# Patient Record
Sex: Male | Born: 1935 | Race: White | Hispanic: No | Marital: Married | Smoking: Never smoker
Health system: Southern US, Community
[De-identification: ages and names within clinical notes are randomized; demographics above are authoritative.]

## PROBLEM LIST (undated history)

## (undated) DIAGNOSIS — I071 Rheumatic tricuspid insufficiency: Secondary | ICD-10-CM

## (undated) DIAGNOSIS — K761 Chronic passive congestion of liver: Secondary | ICD-10-CM

## (undated) DIAGNOSIS — I4891 Unspecified atrial fibrillation: Secondary | ICD-10-CM

## (undated) DIAGNOSIS — R110 Nausea: Secondary | ICD-10-CM

## (undated) DIAGNOSIS — D649 Anemia, unspecified: Secondary | ICD-10-CM

## (undated) DIAGNOSIS — I780 Hereditary hemorrhagic telangiectasia: Secondary | ICD-10-CM

## (undated) DIAGNOSIS — I509 Heart failure, unspecified: Secondary | ICD-10-CM

## (undated) DIAGNOSIS — I272 Pulmonary hypertension, unspecified: Secondary | ICD-10-CM

## (undated) DIAGNOSIS — I50810 Right heart failure, unspecified: Secondary | ICD-10-CM

## (undated) DIAGNOSIS — R197 Diarrhea, unspecified: Secondary | ICD-10-CM

## (undated) DIAGNOSIS — K7460 Unspecified cirrhosis of liver: Secondary | ICD-10-CM

## (undated) DIAGNOSIS — N289 Disorder of kidney and ureter, unspecified: Secondary | ICD-10-CM

## (undated) DIAGNOSIS — D500 Iron deficiency anemia secondary to blood loss (chronic): Principal | ICD-10-CM

## (undated) DIAGNOSIS — I770 Arteriovenous fistula, acquired: Secondary | ICD-10-CM

## (undated) DIAGNOSIS — Z950 Presence of cardiac pacemaker: Secondary | ICD-10-CM

## (undated) DIAGNOSIS — C439 Malignant melanoma of skin, unspecified: Secondary | ICD-10-CM

## (undated) HISTORY — DX: Iron deficiency anemia secondary to blood loss (chronic): D50.0

## (undated) HISTORY — DX: Malignant melanoma of skin, unspecified: C43.9

## (undated) HISTORY — DX: Nausea: R11.0

## (undated) HISTORY — DX: Pulmonary hypertension, unspecified: I27.20

## (undated) HISTORY — DX: Unspecified atrial fibrillation: I48.91

## (undated) HISTORY — DX: Right heart failure, unspecified: I50.810

## (undated) HISTORY — DX: Disorder of kidney and ureter, unspecified: N28.9

## (undated) HISTORY — DX: Arteriovenous fistula, acquired: I77.0

## (undated) HISTORY — DX: Rheumatic tricuspid insufficiency: I07.1

## (undated) HISTORY — DX: Chronic passive congestion of liver: K76.1

## (undated) HISTORY — DX: Diarrhea, unspecified: R19.7

## (undated) HISTORY — PX: TONSILLECTOMY: SHX5217

## (undated) HISTORY — DX: Hereditary hemorrhagic telangiectasia: I78.0

## (undated) HISTORY — PX: PACEMAKER INSERTION: SHX728

## (undated) HISTORY — PX: COLON SURGERY: SHX602

---

## 2015-04-22 ENCOUNTER — Emergency Department: Payer: Medicare Other

## 2015-04-22 ENCOUNTER — Emergency Department
Admission: EM | Admit: 2015-04-22 | Discharge: 2015-04-22 | Disposition: A | Discharge status: Home / Self Care | Payer: Medicare Other | Attending: Emergency Medicine | Admitting: Emergency Medicine

## 2015-04-22 ENCOUNTER — Other Ambulatory Visit: Payer: Self-pay

## 2015-04-22 DIAGNOSIS — I959 Hypotension, unspecified: Secondary | ICD-10-CM

## 2015-04-22 DIAGNOSIS — Z79899 Other long term (current) drug therapy: Secondary | ICD-10-CM | POA: Diagnosis not present

## 2015-04-22 DIAGNOSIS — R031 Nonspecific low blood-pressure reading: Secondary | ICD-10-CM | POA: Insufficient documentation

## 2015-04-22 DIAGNOSIS — R531 Weakness: Secondary | ICD-10-CM | POA: Diagnosis not present

## 2015-04-22 DIAGNOSIS — D649 Anemia, unspecified: Secondary | ICD-10-CM | POA: Insufficient documentation

## 2015-04-22 HISTORY — DX: Presence of cardiac pacemaker: Z95.0

## 2015-04-22 HISTORY — DX: Hereditary hemorrhagic telangiectasia: I78.0

## 2015-04-22 HISTORY — DX: Heart failure, unspecified: I50.9

## 2015-04-22 HISTORY — DX: Anemia, unspecified: D64.9

## 2015-04-22 LAB — BASIC METABOLIC PANEL
Anion gap: 8 (ref 5–15)
BUN: 61 mg/dL — ABNORMAL HIGH (ref 6–20)
CO2: 27 mmol/L (ref 22–32)
Calcium: 8.5 mg/dL — ABNORMAL LOW (ref 8.9–10.3)
Chloride: 93 mmol/L — ABNORMAL LOW (ref 101–111)
Creatinine, Ser: 1.91 mg/dL — ABNORMAL HIGH (ref 0.61–1.24)
GFR calc Af Amer: 37 mL/min — ABNORMAL LOW (ref >60)
GFR, EST NON AFRICAN AMERICAN: 32 mL/min — AB (ref >60)
GLUCOSE: 137 mg/dL — AB (ref 65–99)
POTASSIUM: 2.9 mmol/L — AB (ref 3.5–5.1)
SODIUM: 128 mmol/L — AB (ref 135–145)

## 2015-04-22 LAB — COMPREHENSIVE METABOLIC PANEL
ALK PHOS: 85 U/L (ref 38–126)
ALT: 20 U/L (ref 17–63)
ANION GAP: 10 (ref 5–15)
AST: 35 U/L (ref 15–41)
Albumin: 3.2 g/dL — ABNORMAL LOW (ref 3.5–5.0)
BILIRUBIN TOTAL: 2.1 mg/dL — AB (ref 0.3–1.2)
BUN: 61 mg/dL — AB (ref 6–20)
CHLORIDE: 92 mmol/L — AB (ref 101–111)
CO2: 28 mmol/L (ref 22–32)
Calcium: 8.7 mg/dL — ABNORMAL LOW (ref 8.9–10.3)
Creatinine, Ser: 1.86 mg/dL — ABNORMAL HIGH (ref 0.61–1.24)
GFR calc Af Amer: 39 mL/min — ABNORMAL LOW (ref >60)
GFR calc non Af Amer: 33 mL/min — ABNORMAL LOW (ref >60)
GLUCOSE: 132 mg/dL — AB (ref 65–99)
POTASSIUM: 2.9 mmol/L — AB (ref 3.5–5.1)
SODIUM: 130 mmol/L — AB (ref 135–145)
Total Protein: 5.3 g/dL — ABNORMAL LOW (ref 6.5–8.1)

## 2015-04-22 LAB — CBC
HEMATOCRIT: 23.7 % — AB (ref 40.0–52.0)
HEMOGLOBIN: 7.5 g/dL — AB (ref 13.0–18.0)
MCH: 27.7 pg (ref 26.0–34.0)
MCHC: 31.8 g/dL — AB (ref 32.0–36.0)
MCV: 87.3 fL (ref 80.0–100.0)
Platelets: 64 10*3/uL — ABNORMAL LOW (ref 150–440)
RBC: 2.72 MIL/uL — AB (ref 4.40–5.90)
RDW: 15.3 % — ABNORMAL HIGH (ref 11.5–14.5)
WBC: 3.5 10*3/uL — ABNORMAL LOW (ref 3.8–10.6)

## 2015-04-22 LAB — PROTIME-INR
INR: 1.21
Prothrombin Time: 15.5 seconds — ABNORMAL HIGH (ref 11.4–15.0)

## 2015-04-22 LAB — TROPONIN I: Troponin I: 0.06 ng/mL — ABNORMAL HIGH (ref <0.031)

## 2015-04-22 MED ORDER — POTASSIUM CHLORIDE CRYS ER 20 MEQ PO TBCR
40.0000 meq | EXTENDED_RELEASE_TABLET | Freq: Once | ORAL | Status: AC
  Administered 2015-04-22: 40 meq via ORAL

## 2015-04-22 MED ORDER — POTASSIUM CHLORIDE CRYS ER 20 MEQ PO TBCR
EXTENDED_RELEASE_TABLET | ORAL | Status: AC
  Administered 2015-04-22: 40 meq via ORAL
  Filled 2015-04-22: qty 2

## 2015-04-22 MED ORDER — SODIUM CHLORIDE 0.9 % IV SOLN
Freq: Once | INTRAVENOUS | Status: AC
  Administered 2015-04-22: 14:00:00 via INTRAVENOUS

## 2015-04-22 NOTE — ED Notes (Signed)
Pt c/o generalized weakness today with low b/p 87/40's this morning, normal b/p 90's/60'..Denies pain, recent illness such as N/V/D.[name]

## 2015-04-22 NOTE — ED Notes (Signed)
Pt informed to return if any life threatening symptoms occur.  Pt alert and oriented X4, active, cooperative, pt in NAD. RR even and unlabored, color WNL.  Pt left with significant other.

## 2015-04-22 NOTE — Discharge Instructions (Signed)
Hypotension  As your heart beats, it forces blood through your arteries. This force is your blood pressure. If your blood pressure is too low for you to go about your normal activities or to support the organs of your body, you have hypotension. Hypotension is also referred to as low blood pressure. When your blood pressure becomes too low, you may not get enough blood to your brain. As a result, you may feel weak, feel lightheaded, or develop a rapid heart rate. In a more severe case, you may faint.  CAUSES  Various conditions can cause hypotension. These include:  · Blood loss.  · Dehydration.  · Heart or endocrine problems.  · Pregnancy.  · Severe infection.  · Not having a well-balanced diet filled with needed nutrients.  · Severe allergic reactions (anaphylaxis).  Some medicines, such as blood pressure medicine or water pills (diuretics), may lower your blood pressure below normal. Sometimes taking too much medicine or taking medicine not as directed can cause hypotension.  TREATMENT   Hospitalization is sometimes required for hypotension if fluid or blood replacement is needed, if time is needed for medicines to wear off, or if further monitoring is needed. Treatment might include changing your diet, changing your medicines (including medicines aimed at raising your blood pressure), and use of support stockings.  HOME CARE INSTRUCTIONS   · Drink enough fluids to keep your urine clear or pale yellow.  · Take your medicines as directed by your health care provider.  · Get up slowly from reclining or sitting positions. This gives your blood pressure a chance to adjust.  · Wear support stockings as directed by your health care provider.  · Maintain a healthy diet by including nutritious food, such as fruits, vegetables, nuts, whole grains, and lean meats.  SEEK MEDICAL CARE IF:  · You have vomiting or diarrhea.  · You have a fever for more than 2-3 days.  · You feel more thirsty than usual.  · You feel weak and  tired.  SEEK IMMEDIATE MEDICAL CARE IF:   · You have chest pain or a fast or irregular heartbeat.  · You have a loss of feeling in some part of your body, or you lose movement in your arms or legs.  · You have trouble speaking.  · You become sweaty or feel lightheaded.  · You faint.  MAKE SURE YOU:   · Understand these instructions.  · Will watch your condition.  · Will get help right away if you are not doing well or get worse.  Document Released: 11/27/2005 Document Revised: 09/17/2013 Document Reviewed: 05/30/2013  ExitCare® Patient Information ©2015 [employer]. This information is not intended to replace advice given to you by your health care provider. Make sure you discuss any questions you have with your health care provider.

## 2015-04-22 NOTE — ED Provider Notes (Signed)
[facility] Emergency Department Provider Note    Time seen: 1400  I have reviewed the triage vital signs and the nursing notes.   HISTORY  Chief Complaint Weakness and Hypotension    HPI Hunter [name] is a 80 y.o. male who presents to ER for weakness and low blood pressure. Patient was seen at the outpatient clinic, his blood pressure noted to be 87/40. Patient just describes general ill feeling of weakness, denies any fevers chills chest pain shortness of breath nausea vomiting diarrhea. No recent changes in his medications. He has noted poor appetite of late.    Past Medical History  Diagnosis Date  . CHF (congestive heart failure)   . Pacemaker   . HHT (hereditary hemorrhagic telangiectasia)   . Anemia     There are no active problems to display for this patient.   Past Surgical History  Procedure Laterality Date  . Pacemaker insertion      Current Outpatient Rx  Name  Route  Sig  Dispense  Refill  . cyanocobalamin 1000 MCG tablet   Intramuscular   Inject 100 mcg into the muscle every 30 (thirty) days.         . digoxin (LANOXIN) 0.125 MG tablet   Oral   Take by mouth daily.         . ferrous sulfate 325 (65 FE) MG EC tablet   Oral   Take 325 mg by mouth 3 (three) times daily with meals.         . magnesium 30 MG tablet   Oral   Take 250 mg by mouth 1 day or 1 dose.         . metolazone (ZAROXOLYN) 2.5 MG tablet   Oral   Take 2.5 mg by mouth daily.         . metolazone (ZAROXOLYN) 5 MG tablet   Oral   Take 5 mg by mouth daily.         . potassium chloride (MICRO-K) 10 MEQ CR capsule   Oral   Take 10 mEq by mouth 2 (two) times daily.         [name] spironolactone (ALDACTONE) 25 MG tablet   Oral   Take 25 mg by mouth 2 (two) times daily.         . tamsulosin (FLOMAX) 0.4 MG CAPS capsule   Oral   Take 0.4 mg by mouth.         . torsemide (DEMADEX) 100 MG tablet   Oral   Take 100 mg by mouth daily.        [name] vorinostat (ZOLINZA) 100 MG capsule   Oral   Take 400 mg by mouth daily. Take with food.         . zolpidem (AMBIEN) 5 MG tablet   Oral   Take 5 mg by mouth at bedtime as needed for sleep.           Allergies Review of patient's allergies indicates no known allergies.  No family history on file.  Social History History  Substance Use Topics  . Smoking status: Never Smoker   . Smokeless tobacco: Never Used  . Alcohol Use: No    Review of Systems Constitutional: Negative for fever. Eyes: Negative for visual changes. ENT: Negative for sore throat. Cardiovascular: Negative for chest pain. Respiratory: Negative for shortness of breath. Gastrointestinal: Negative for abdominal pain, vomiting and diarrhea. Genitourinary: Negative for dysuria. Musculoskeletal: Negative for back pain. Skin: Negative for  rash. Neurological: Negative for headaches, positive for generalized weakness  10-point ROS otherwise negative.  ____________________________________________   PHYSICAL EXAM:  VITAL SIGNS: ED Triage Vitals  Enc Vitals Group     BP 04/22/15 1225 97/47 mmHg     Pulse Rate 04/22/15 1225 107     Resp 04/22/15 1225 20     Temp 04/22/15 1225 97.7 F (36.5 C)     Temp Source 04/22/15 1225 Oral     SpO2 04/22/15 1225 100 %     Weight 04/22/15 1225 155 lb (70.308 kg)     Height 04/22/15 1225 6' (1.829 m)     Head Cir --      Peak Flow --      Pain Score --      Pain Loc --      Pain Edu? --      Excl. in [place]? --     Constitutional: Alert and oriented. Well appearing and in no distress. Eyes: Conjunctivae are normal. PERRL. Normal extraocular movements. Scleral icterus ENT   Head: Normocephalic and atraumatic.   Nose: No congestion/rhinnorhea.   Mouth/Throat: Mucous membranes are moist.   Neck: No stridor. Hematological/Lymphatic/Immunilogical: No cervical lymphadenopathy. Cardiovascular: Normal rate, regular rhythm. Normal and symmetric distal  pulses are present in all extremities. No murmurs, rubs, or gallops. Respiratory: Normal respiratory effort without tachypnea nor retractions. Breath sounds are clear and equal bilaterally. No wheezes/rales/rhonchi. Gastrointestinal: Soft and nontender. Likely ascites Musculoskeletal: Nontender with normal range of motion in all extremities. No joint effusions.  No lower extremity tenderness nor edema. Neurologic:  Normal speech and language. No gross focal neurologic deficits are appreciated. Speech is normal. No gait instability. Skin:  Skin is warm, dry and intact. Jaundice is present Psychiatric: Mood and affect are normal. Speech and behavior are normal. Patient exhibits appropriate insight and judgment.  ____________________________________________    LABS (pertinent positives/negatives)  Labs Reviewed  CBC - Abnormal; Notable for the following:    WBC 3.5 (*)    RBC 2.72 (*)    Hemoglobin 7.5 (*)    HCT 23.7 (*)    MCHC 31.8 (*)    RDW 15.3 (*)    Platelets 64 (*)    All other components within normal limits  BASIC METABOLIC PANEL - Abnormal; Notable for the following:    Sodium 128 (*)    Potassium 2.9 (*)    Chloride 93 (*)    Glucose, Bld 137 (*)    BUN 61 (*)    Creatinine, Ser 1.91 (*)    Calcium 8.5 (*)    GFR calc non Af Amer 32 (*)    GFR calc Af Amer 37 (*)    All other components within normal limits     ____________________________________________ EKG: Ventricular paced rhythm with a rate of 80   RADIOLOGY  Chest x-ray reveals cardiomegaly with slight vascular congestion  ____________________________________________    ED COURSE  Pertinent labs & imaging results that were available during my care of the patient were reviewed by me and considered in my medical decision making (see chart for details).  Patient received IV fluid hydration, will check basic labs were evaluated.  I discussed the case with patient's primary cardiologist in [place].  We discussed the lab results, and elevated troponin. Cardiologist was Dr. Lupita Dawn. He is not concerned with a troponin elevation. States hemoglobin was slightly lower than normal and the creatinine was slightly higher than normal. He attributed the increased creatinine to decreased by mouth  intake  FINAL ASSESSMENT AND PLAN  Weakness and hypotension  Plan: Patient is follow-up with his cardiologist on Tuesday, will encourage him to continue that appointment. I offered admission, the family has declined at this point. He is stable for discharge this time with a normal blood pressure.    Earleen Newport, MD   Earleen Newport, MD 04/22/15 [phone]

## 2015-04-22 NOTE — ED Notes (Signed)
EDP aware of troponin result.  

## 2015-04-22 NOTE — ED Notes (Addendum)
C/o weakness progressively for "weeks". Sent to ER for low blood pressure. Reports of systolic in 82'C. Pt pale. Denies GI bleeding. Denies nausea, vomiting or diarrhea. Recent nose bleeds, last one on Tuesday. Denies CP or SOB. Pt alert and oriented X4, cooperative, pt in NAD. Denies dizziness. Pt appears pale.

## 2015-04-22 NOTE — ED Notes (Signed)
MD at bedside. 

## 2015-07-16 ENCOUNTER — Encounter: Payer: Self-pay | Admitting: Internal Medicine

## 2015-08-09 ENCOUNTER — Encounter: Payer: Self-pay | Admitting: Internal Medicine

## 2015-08-09 ENCOUNTER — Other Ambulatory Visit (INDEPENDENT_AMBULATORY_CARE_PROVIDER_SITE_OTHER): Payer: Medicare Other

## 2015-08-09 ENCOUNTER — Ambulatory Visit (INDEPENDENT_AMBULATORY_CARE_PROVIDER_SITE_OTHER): Payer: Medicare Other | Admitting: Internal Medicine

## 2015-08-09 VITALS — BP 85/40 | HR 80 | Ht 72.0 in | Wt 175.1 lb

## 2015-08-09 DIAGNOSIS — I340 Nonrheumatic mitral (valve) insufficiency: Secondary | ICD-10-CM

## 2015-08-09 DIAGNOSIS — I770 Arteriovenous fistula, acquired: Secondary | ICD-10-CM

## 2015-08-09 DIAGNOSIS — I071 Rheumatic tricuspid insufficiency: Secondary | ICD-10-CM

## 2015-08-09 DIAGNOSIS — K761 Chronic passive congestion of liver: Secondary | ICD-10-CM | POA: Diagnosis not present

## 2015-08-09 DIAGNOSIS — D500 Iron deficiency anemia secondary to blood loss (chronic): Secondary | ICD-10-CM

## 2015-08-09 DIAGNOSIS — I509 Heart failure, unspecified: Secondary | ICD-10-CM

## 2015-08-09 DIAGNOSIS — I780 Hereditary hemorrhagic telangiectasia: Secondary | ICD-10-CM | POA: Insufficient documentation

## 2015-08-09 DIAGNOSIS — I50810 Right heart failure, unspecified: Secondary | ICD-10-CM

## 2015-08-09 DIAGNOSIS — N189 Chronic kidney disease, unspecified: Secondary | ICD-10-CM

## 2015-08-09 HISTORY — DX: Iron deficiency anemia secondary to blood loss (chronic): D50.0

## 2015-08-09 HISTORY — DX: Rheumatic tricuspid insufficiency: I07.1

## 2015-08-09 HISTORY — DX: Right heart failure, unspecified: I50.810

## 2015-08-09 HISTORY — DX: Arteriovenous fistula, acquired: I77.0

## 2015-08-09 HISTORY — DX: Chronic passive congestion of liver: K76.1

## 2015-08-09 LAB — CBC WITH DIFFERENTIAL/PLATELET
BASOS ABS: 0 10*3/uL (ref 0.0–0.1)
Basophils Relative: 0.3 % (ref 0.0–3.0)
EOS ABS: 0 10*3/uL (ref 0.0–0.7)
Eosinophils Relative: 0 % (ref 0.0–5.0)
HCT: 17.3 % — CL (ref 39.0–52.0)
Hemoglobin: 5.5 g/dL — CL (ref 13.0–17.0)
LYMPHS ABS: 0.3 10*3/uL — AB (ref 0.7–4.0)
Lymphocytes Relative: 7.4 % — ABNORMAL LOW (ref 12.0–46.0)
MCHC: 31.8 g/dL (ref 30.0–36.0)
MCV: 84.9 fl (ref 78.0–100.0)
MONO ABS: 0.7 10*3/uL (ref 0.1–1.0)
NEUTROS PCT: 73.6 % (ref 43.0–77.0)
Neutro Abs: 2.7 10*3/uL (ref 1.4–7.7)
Platelets: 56 10*3/uL — ABNORMAL LOW (ref 150.0–400.0)
RBC: 2.04 Mil/uL — AB (ref 4.22–5.81)
RDW: 22.3 % — ABNORMAL HIGH (ref 11.5–15.5)
WBC: 3.6 10*3/uL — ABNORMAL LOW (ref 4.0–10.5)

## 2015-08-09 LAB — COMPREHENSIVE METABOLIC PANEL
ALK PHOS: 176 U/L — AB (ref 39–117)
ALT: 26 U/L (ref 0–53)
AST: 30 U/L (ref 0–37)
Albumin: 2.8 g/dL — ABNORMAL LOW (ref 3.5–5.2)
BILIRUBIN TOTAL: 2 mg/dL — AB (ref 0.2–1.2)
BUN: 70 mg/dL — AB (ref 6–23)
CO2: 29 meq/L (ref 19–32)
Calcium: 8.4 mg/dL (ref 8.4–10.5)
Chloride: 87 mEq/L — ABNORMAL LOW (ref 96–112)
Creatinine, Ser: 2.19 mg/dL — ABNORMAL HIGH (ref 0.40–1.50)
GFR: 31.1 mL/min — ABNORMAL LOW (ref >60.00)
GLUCOSE: 107 mg/dL — AB (ref 70–99)
Potassium: 4.6 mEq/L (ref 3.5–5.1)
SODIUM: 122 meq/L — AB (ref 135–145)
TOTAL PROTEIN: 4.9 g/dL — AB (ref 6.0–8.3)

## 2015-08-09 LAB — AMMONIA: Ammonia: 113 umol/L — ABNORMAL HIGH (ref 11–35)

## 2015-08-09 LAB — TSH: TSH: 5.49 u[IU]/mL — ABNORMAL HIGH (ref 0.35–4.50)

## 2015-08-09 LAB — BRAIN NATRIURETIC PEPTIDE: Pro B Natriuretic peptide (BNP): 188 pg/mL — ABNORMAL HIGH (ref 0.0–100.0)

## 2015-08-09 LAB — PROTIME-INR
INR: 1.2 ratio — ABNORMAL HIGH (ref 0.8–1.0)
Prothrombin Time: 13.5 s — ABNORMAL HIGH (ref 9.6–13.1)

## 2015-08-09 NOTE — Patient Instructions (Signed)
Your physician has requested that you go to the basement for lab work before leaving today.   Dr. Carlean Purl will speak with Dr Haroldine Laws at [facility] and contact you with an appointment.    I appreciate the opportunity to care for you. Silvano Rusk, MD, [facility]

## 2015-08-09 NOTE — Progress Notes (Signed)
Subjective:    Patient ID: Hunter [name], male    DOB: Jan 04, 1935, 80 y.o.   MRN: [id number] Cc; cirrhosis, establish care HPI  The patient is an elderly white man here with his wife and another family member, he has recently moved to [place]. He has right-sided heart failure, hereditary hemorrhagic telangiectasia and cirrhosis thought to be due to his right heart failure. He has come to me to establish care. Over the past either so he is deteriorated with worsening dyspnea and fatigue, he has had the development of hepatic encephalopathy. His wife said that that is better at this point on Xifaxan and lactulose. He is having some increasing pedal and lower extremity edema. He ambulates in the home with a walker and moves slowly and apparently doesn't get short of breath moving from one room to the other. He is not describing any chest pain problems. He has not had any GI bleeding. He has chronic recurrent anemia and has had a number of transfusions over the years. He has suffered with epistaxis in the past but not recently. Other cardiac review of systems is negative for orthopnea. He has intermittent anorexia some nausea. Perhaps some early satiety at times. He does not think he's having increasing abdominal girth at this time though he has had ascites per the family. There is anhedonia. He has received care at [facility] and the [facility] [facility] of late.  He was hospitalized in [place], discharged 07/24/2015. He was treated with Xifaxan and lactulose for his acute hepatic encephalopathy. Chronic renal insufficiency noted. Spironolactone was restarted at half dose was limited by blood pressure issues. He has a history of A. Fib. Previously admitted 06/30/1959 07/08/2015 with similar problems.  He was seen at the [facility] cardiology clinic. There after he left it was entertained for him to undergo possible  bevacizumab Tx for hepatic AVM's that were worsening HF.  Other notes and her uses  studies from before that visit summarized below  EKG today ventricularly paced at 88 bpm.   TTE 02/09/15, which I reviewed today, found low normal LV function, enlarged RV with mildly impaired function, trivial pericardial effusion, moderate MR, probably severe TR which seems to be related to both dilation and pacemaker lead, biatrial enlargement, interatrial septum bows to the left, dilated IVC, and estimated PASP 65 mmHg.  Hemodynamic catheterization 01/29/15 LVEDP 105/25, Ao 105/50, RA mean 20, RV 45/20, PA 45/30/35, PCWP 30, thermal CO/CI 11.4/5.4, Fick 5.9/2.8.  CTPA 03/01/15, which I reviewed today, found aortic and coronary calcifications,  1. No evidence of pulmonary AVMs in this patient with a reported history of HHT. 2. Dilation of the pulmonary arteries suggesting chronic pulmonary hypertension. 3. Evidence of multiple hepatic AVMs with dilation of the hepatic arteries. Liver demonstrates surface irregularity suggesting cirrhosis. These findings are unchanged from 2013. 4. Marked dilation of the intrahepatic IVC consistent with chronically elevated vena cava pressures. 5. Moderate to severe cardiomegaly.  PFTs today FVC 66%, FEV1 62%, FEV1/FVC 0.67, VC 64%, TLC 85%, RV 118%, DLCO 65% (uncorrected).  6 minute walk today 290 m.  Pacemaker interrogation 04/27/15 with Dr. Lennie Odor found VP 99%.  CTA chest 03/01/2015 IMPRESSION: 1. No evidence of pulmonary AVMs in this patient with a reported history of HHT.  2. Dilation of the pulmonary arteries suggesting chronic pulmonary arterial hypertension.  3. Evidence of multiple hepatic AVMs with dilation of the hepatic arteries. Liver demonstrates surface irregularity suggesting cirrhosis. These findings are unchanged from 2013.  4. Marked dilation of  the intrahepatic IVC consistent with chronically elevated vena cava pressures.  5. Moderate to severe cardiomegaly.  6. Other chronic findings as above. No Known Allergies Outpatient  Prescriptions Prior to Visit  Medication Sig Dispense Refill  . Cyanocobalamin 1000 MCG/ML KIT Inject 1 Syringe as directed every 30 (thirty) days.    . digoxin (LANOXIN) 0.125 MG tablet Take by mouth. Mon, Wed, Fri    . ferrous sulfate 325 (65 FE) MG EC tablet Take 325 mg by mouth 2 (two) times daily with a meal.     . Magnesium 250 MG TABS Take 1 tablet by mouth daily.    . metolazone (ZAROXOLYN) 2.5 MG tablet Take 2.5 mg by mouth. Mon and Fri    . potassium chloride (MICRO-K) 10 MEQ CR capsule Take 10 mEq by mouth 2 (two) times daily.    [name] spironolactone (ALDACTONE) 25 MG tablet Take 25 mg by mouth daily.     . tamsulosin (FLOMAX) 0.4 MG CAPS capsule Take 0.4 mg by mouth.    . torsemide (DEMADEX) 100 MG tablet Take 100 mg by mouth as directed.     . vorinostat (ZOLINZA) 100 MG capsule Take 400 mg by mouth daily. Take with food.    . metolazone (ZAROXOLYN) 2.5 MG tablet Take 2.5 mg by mouth daily.    [name] zolpidem (AMBIEN) 5 MG tablet Take 5 mg by mouth at bedtime as needed for sleep.     No facility-administered medications prior to visit.   Past Medical History  Diagnosis Date  . CHF (congestive heart failure)   . Pacemaker   . HHT (hereditary hemorrhagic telangiectasia)   . Anemia   . Pulmonary hypertension   . Osler-Weber-Rendu syndrome   . Renal insufficiency   . Liver failure   . Atrial fibrillation   . Hepatic AV fistula 08/09/2015  . 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 Anemia due to chronic blood loss 08/09/2015  . Right heart failure 08/09/2015  . Tricuspid regurgitation 08/09/2015   Past Surgical History  Procedure Laterality Date  . Pacemaker insertion    . Colon surgery  1196   Social History   Social History  . Marital Status: Married    Spouse Name: N/A  . Number of Children: 1  . Years of Education: N/A   Occupational History  . retired    Social History Main Topics  . Smoking status: Never Smoker   . Smokeless tobacco: Never Used  . Alcohol Use: No  . Drug  Use: No  . Sexual Activity: Yes   Other Topics Concern  . None   Social History Narrative   Married 1 son   Retired Therapist, art   One caffeinated beverage a day no alcohol   He was living in [employer] at the [place] on [address] in [place] and has located to [place] recently.   08/09/2015      Family History  Problem Relation Age of Onset  . Other Mother     HHT  . Other Son     HHT  . Heart failure Father        Review of Systems As per history of present illness. Ears eyeglasses. Hearing aids. All other review of systems negative or as per history of present illness.    Objective:   Physical Exam _0  85/40 mmHg  Pulse 80  Ht 6' (1.829 m)  Wt 175 lb 2 oz (79.436 kg)  BMI 23.75 kg/m2@  General:  Well-developed, well-nourished  and in no acute distress Eyes:  anicteric. ENT:   Mouth and posterior pharynx free - +++telangiectasia  Neck:   supple w/o thyromegaly or mass.  Lungs: Clear to auscultation bilaterally. Heart:  Paced S1S2,  there is a regurgitant murmur heard best in the left lower sternal border it does not seem to radiate into the axilla much no gallops. There is JVD Abdomen:  soft, non-tender, no hepatosplenomegaly, hernia, or mass and BS+. It is mildly distended but I do not detect ascites and I do not hear any bruits over the liver Lymph:  no cervical or supraclavicular adenopathy. Extremities:   1+LE bilat  edema, cyanosis or clubbing Skin   + telangiectasia, purpura, pale nailbeds, chronic venous stasis changes in the pretibial areas  Neuro:  A&O x 3. No asterixis Psych:  appropriate mood and  Affect.   Data Reviewed:   Ultrasound US ABDOMEN LIMITED7/28/2016  [facility]  US ABDOMEN LIMITED7/28/2016  [facility]  Result Impression  IMPRESSION:  Sonographic signs of cirrhosis/fibrosis.  No focal lesions.  If there is further clinical concern, consider MRI.  Inferior vena cava and hepatic venous  dilatation likely secondary to congestive heart failure.  Cholelithiasis without inflammatory change.  If there is additional clinical concern, consider hepatobiliary scan.    Chronic renal parenchymal disease.    He has negative for hepatitis A, B, and C studies. He has had pneumonia vaccines and has been up-to-date with influenza over time his wife says. Alpha-1 antitrypsin level was normal  Ferritin was 28 and July.  He does not have cerebral AVM's, he has had brain imaging  No prior EGD or colonoscopy reported    Assessment & Plan:   1. Anemia due to chronic blood loss   2. HHT (hereditary hemorrhagic telangiectasia)   3. Cardiac cirrhosis   4. Right heart failure   5. Mitral regurgitation   6. Tricuspid regurgitation   7. Hepatic AV fistula   8. Chronic kidney disease Is a very complex patient with progressive problems. I think he might be more anemic. That certainly would exacerbate his right heart failure. He has renal insufficiency as well. I had planned to get him an evaluation in the heart failure clinic this week. His labs is subsequently did return with multiple metabolic derangements, hemoglobin 5 sodium 122 and an elevated ammonia. His creatinine is worse. I have attempted to contact the patient cannot reach him by phone at this time. I will try again. I think he will benefit from admission to the hospital and transfusion and further therapy. Long-term I'm not sure how much could be done for him.  As far as considering bevacizumab I look this up and a [place] using a phase II trial also in a patient such as this I'll think is worth any consideration most likely was here to enroll in a research trial. He is so ill otherwise I doubt the risks are worth the potential benefit that is unclear.  Lab Results  Component Value Date   WBC 3.6* 08/09/2015   HGB 5.5 Repeated and verified X2.* 08/09/2015   HCT 17.3 Repeated and verified X2.* 08/09/2015   MCV 84.9 08/09/2015   PLT  56.0 Repeated and verified X2.* 08/09/2015     Chemistry      Component Value Date/Time   NA 122* 08/09/2015 1621   K 4.6 08/09/2015 1621   CL 87* 08/09/2015 1621   CO2 29 08/09/2015 1621   BUN 70* 08/09/2015 1621   CREATININE 2.19*  08/09/2015 1621      Component Value Date/Time   CALCIUM 8.4 08/09/2015 1621   ALKPHOS 176* 08/09/2015 1621   AST 30 08/09/2015 1621   ALT 26 08/09/2015 1621   BILITOT 2.0* 08/09/2015 1621     Lab Results  Component Value Date   INR 1.2* 08/09/2015   INR 1.21 04/22/2015   Lab Results  Component Value Date   TSH 5.49* 08/09/2015   Nh3 113 BNP 188  I think the primary thing at this point is to transfuse the patient and try to improve his sodium as well. I have had some contact with Dr. Haroldine Laws of the heart failure service, and he will see the patient once he is admitted, see him by tomorrow. I will attempt to call the patient again if not we'll try again in the morning. He is certainly ill but this is been a slow progression so I don't think there is any acute change here.

## 2015-08-10 ENCOUNTER — Inpatient Hospital Stay (HOSPITAL_COMMUNITY)
Admission: EM | Admit: 2015-08-10 | Discharge: 2015-08-15 | DRG: 378 | Disposition: A | Discharge status: Home Health | Payer: Medicare Other | Attending: Family Medicine | Admitting: Family Medicine

## 2015-08-10 ENCOUNTER — Telehealth: Payer: Self-pay | Admitting: Internal Medicine

## 2015-08-10 ENCOUNTER — Encounter (HOSPITAL_COMMUNITY): Payer: Self-pay | Admitting: Emergency Medicine

## 2015-08-10 DIAGNOSIS — R040 Epistaxis: Secondary | ICD-10-CM | POA: Diagnosis not present

## 2015-08-10 DIAGNOSIS — D500 Iron deficiency anemia secondary to blood loss (chronic): Secondary | ICD-10-CM | POA: Diagnosis present

## 2015-08-10 DIAGNOSIS — E876 Hypokalemia: Secondary | ICD-10-CM | POA: Diagnosis not present

## 2015-08-10 DIAGNOSIS — K7460 Unspecified cirrhosis of liver: Secondary | ICD-10-CM | POA: Diagnosis not present

## 2015-08-10 DIAGNOSIS — Z79899 Other long term (current) drug therapy: Secondary | ICD-10-CM | POA: Diagnosis not present

## 2015-08-10 DIAGNOSIS — E871 Hypo-osmolality and hyponatremia: Secondary | ICD-10-CM | POA: Diagnosis present

## 2015-08-10 DIAGNOSIS — Z66 Do not resuscitate: Secondary | ICD-10-CM | POA: Diagnosis not present

## 2015-08-10 DIAGNOSIS — N189 Chronic kidney disease, unspecified: Secondary | ICD-10-CM

## 2015-08-10 DIAGNOSIS — R531 Weakness: Secondary | ICD-10-CM | POA: Diagnosis present

## 2015-08-10 DIAGNOSIS — I081 Rheumatic disorders of both mitral and tricuspid valves: Secondary | ICD-10-CM | POA: Diagnosis present

## 2015-08-10 DIAGNOSIS — Z950 Presence of cardiac pacemaker: Secondary | ICD-10-CM

## 2015-08-10 DIAGNOSIS — I272 Other secondary pulmonary hypertension: Secondary | ICD-10-CM | POA: Diagnosis not present

## 2015-08-10 DIAGNOSIS — N184 Chronic kidney disease, stage 4 (severe): Secondary | ICD-10-CM | POA: Diagnosis present

## 2015-08-10 DIAGNOSIS — K31811 Angiodysplasia of stomach and duodenum with bleeding: Principal | ICD-10-CM | POA: Diagnosis present

## 2015-08-10 DIAGNOSIS — D649 Anemia, unspecified: Secondary | ICD-10-CM | POA: Diagnosis not present

## 2015-08-10 DIAGNOSIS — D62 Acute posthemorrhagic anemia: Secondary | ICD-10-CM | POA: Diagnosis not present

## 2015-08-10 DIAGNOSIS — K7290 Hepatic failure, unspecified without coma: Secondary | ICD-10-CM | POA: Insufficient documentation

## 2015-08-10 DIAGNOSIS — I959 Hypotension, unspecified: Secondary | ICD-10-CM

## 2015-08-10 DIAGNOSIS — I780 Hereditary hemorrhagic telangiectasia: Secondary | ICD-10-CM | POA: Diagnosis present

## 2015-08-10 DIAGNOSIS — K449 Diaphragmatic hernia without obstruction or gangrene: Secondary | ICD-10-CM | POA: Diagnosis present

## 2015-08-10 DIAGNOSIS — I5032 Chronic diastolic (congestive) heart failure: Secondary | ICD-10-CM | POA: Diagnosis present

## 2015-08-10 DIAGNOSIS — I509 Heart failure, unspecified: Secondary | ICD-10-CM | POA: Diagnosis not present

## 2015-08-10 DIAGNOSIS — Z515 Encounter for palliative care: Secondary | ICD-10-CM | POA: Diagnosis not present

## 2015-08-10 DIAGNOSIS — K761 Chronic passive congestion of liver: Secondary | ICD-10-CM | POA: Diagnosis not present

## 2015-08-10 DIAGNOSIS — D696 Thrombocytopenia, unspecified: Secondary | ICD-10-CM | POA: Diagnosis present

## 2015-08-10 DIAGNOSIS — K7469 Other cirrhosis of liver: Secondary | ICD-10-CM | POA: Diagnosis not present

## 2015-08-10 DIAGNOSIS — R195 Other fecal abnormalities: Secondary | ICD-10-CM | POA: Diagnosis not present

## 2015-08-10 DIAGNOSIS — I5030 Unspecified diastolic (congestive) heart failure: Secondary | ICD-10-CM

## 2015-08-10 LAB — URINALYSIS, ROUTINE W REFLEX MICROSCOPIC
Bilirubin Urine: NEGATIVE
GLUCOSE, UA: NEGATIVE mg/dL
Hgb urine dipstick: NEGATIVE
KETONES UR: NEGATIVE mg/dL
LEUKOCYTES UA: NEGATIVE
NITRITE: NEGATIVE
PROTEIN: NEGATIVE mg/dL
Specific Gravity, Urine: 1.007 (ref 1.005–1.030)
UROBILINOGEN UA: 0.2 mg/dL (ref 0.0–1.0)
pH: 7.5 (ref 5.0–8.0)

## 2015-08-10 LAB — I-STAT CHEM 8, ED
BUN: 74 mg/dL — ABNORMAL HIGH (ref 6–20)
Calcium, Ion: 1.11 mmol/L — ABNORMAL LOW (ref 1.13–1.30)
Chloride: 84 mmol/L — ABNORMAL LOW (ref 101–111)
Creatinine, Ser: 2.5 mg/dL — ABNORMAL HIGH (ref 0.61–1.24)
Glucose, Bld: 114 mg/dL — ABNORMAL HIGH (ref 65–99)
HEMATOCRIT: 19 % — AB (ref 39.0–52.0)
HEMOGLOBIN: 6.5 g/dL — AB (ref 13.0–17.0)
POTASSIUM: 4.5 mmol/L (ref 3.5–5.1)
Sodium: 122 mmol/L — ABNORMAL LOW (ref 135–145)
TCO2: 26 mmol/L (ref 0–100)

## 2015-08-10 LAB — CBC
HCT: 18.3 % — ABNORMAL LOW (ref 39.0–52.0)
HEMOGLOBIN: 5.7 g/dL — AB (ref 13.0–17.0)
MCH: 26.8 pg (ref 26.0–34.0)
MCHC: 31.1 g/dL (ref 30.0–36.0)
MCV: 85.9 fL (ref 78.0–100.0)
PLATELETS: 64 10*3/uL — AB (ref 150–400)
RBC: 2.13 MIL/uL — AB (ref 4.22–5.81)
RDW: 20.5 % — ABNORMAL HIGH (ref 11.5–15.5)
WBC: 3.9 10*3/uL — AB (ref 4.0–10.5)

## 2015-08-10 LAB — COMPREHENSIVE METABOLIC PANEL
ALBUMIN: 2.6 g/dL — AB (ref 3.5–5.0)
ALT: 29 U/L (ref 17–63)
AST: 40 U/L (ref 15–41)
Alkaline Phosphatase: 175 U/L — ABNORMAL HIGH (ref 38–126)
Anion gap: 9 (ref 5–15)
BUN: 70 mg/dL — AB (ref 6–20)
CHLORIDE: 88 mmol/L — AB (ref 101–111)
CO2: 27 mmol/L (ref 22–32)
Calcium: 8.7 mg/dL — ABNORMAL LOW (ref 8.9–10.3)
Creatinine, Ser: 2.39 mg/dL — ABNORMAL HIGH (ref 0.61–1.24)
GFR calc Af Amer: 28 mL/min — ABNORMAL LOW (ref >60)
GFR, EST NON AFRICAN AMERICAN: 25 mL/min — AB (ref >60)
Glucose, Bld: 121 mg/dL — ABNORMAL HIGH (ref 65–99)
POTASSIUM: 4.6 mmol/L (ref 3.5–5.1)
SODIUM: 124 mmol/L — AB (ref 135–145)
Total Bilirubin: 2.5 mg/dL — ABNORMAL HIGH (ref 0.3–1.2)
Total Protein: 4.8 g/dL — ABNORMAL LOW (ref 6.5–8.1)

## 2015-08-10 LAB — MRSA PCR SCREENING: MRSA by PCR: NEGATIVE

## 2015-08-10 LAB — CBG MONITORING, ED: GLUCOSE-CAPILLARY: 124 mg/dL — AB (ref 65–99)

## 2015-08-10 LAB — PROTIME-INR
INR: 1.33 (ref 0.00–1.49)
Prothrombin Time: 16.6 seconds — ABNORMAL HIGH (ref 11.6–15.2)

## 2015-08-10 LAB — ABO/RH: ABO/RH(D): A POS

## 2015-08-10 LAB — I-STAT CG4 LACTIC ACID, ED: LACTIC ACID, VENOUS: 1.97 mmol/L (ref 0.5–2.0)

## 2015-08-10 LAB — HEMOGLOBIN AND HEMATOCRIT, BLOOD
HCT: 20.3 % — ABNORMAL LOW (ref 39.0–52.0)
Hemoglobin: 6.4 g/dL — CL (ref 13.0–17.0)

## 2015-08-10 LAB — DIGOXIN LEVEL: Digoxin Level: 0.6 ng/mL — ABNORMAL LOW (ref 0.8–2.0)

## 2015-08-10 LAB — AMMONIA: AMMONIA: 133 umol/L — AB (ref 9–35)

## 2015-08-10 LAB — POC OCCULT BLOOD, ED: Fecal Occult Bld: POSITIVE — AB

## 2015-08-10 LAB — PREPARE RBC (CROSSMATCH)

## 2015-08-10 LAB — APTT: aPTT: 32 seconds (ref 24–37)

## 2015-08-10 MED ORDER — RENA-VITE PO TABS
1.0000 | ORAL_TABLET | Freq: Every day | ORAL | Status: DC
  Administered 2015-08-10 – 2015-08-14 (×5): 1 via ORAL
  Filled 2015-08-10 (×5): qty 1

## 2015-08-10 MED ORDER — SODIUM CHLORIDE 0.9 % IJ SOLN
3.0000 mL | Freq: Two times a day (BID) | INTRAMUSCULAR | Status: DC
  Administered 2015-08-10: 3 mL via INTRAVENOUS
  Administered 2015-08-11: 10 mL via INTRAVENOUS
  Administered 2015-08-11 – 2015-08-15 (×8): 3 mL via INTRAVENOUS

## 2015-08-10 MED ORDER — PROMETHAZINE HCL 25 MG PO TABS: 12.5000 mg | ORAL_TABLET | Freq: Four times a day (QID) | ORAL | Status: DC | PRN

## 2015-08-10 MED ORDER — SODIUM CHLORIDE 0.9 % IV SOLN
Freq: Once | INTRAVENOUS | Status: AC
  Administered 2015-08-10: 14:00:00 via INTRAVENOUS

## 2015-08-10 MED ORDER — LACTULOSE 10 GM/15ML PO SOLN
20.0000 g | Freq: Three times a day (TID) | ORAL | Status: DC
  Administered 2015-08-10 – 2015-08-12 (×5): 20 g via ORAL
  Filled 2015-08-10 (×6): qty 30

## 2015-08-10 MED ORDER — ENSURE ENLIVE PO LIQD
237.0000 mL | Freq: Two times a day (BID) | ORAL | Status: DC
  Administered 2015-08-11 – 2015-08-15 (×8): 237 mL via ORAL

## 2015-08-10 MED ORDER — FERROUS SULFATE 325 (65 FE) MG PO TBEC
325.0000 mg | DELAYED_RELEASE_TABLET | Freq: Two times a day (BID) | ORAL | Status: DC
  Administered 2015-08-11 – 2015-08-15 (×8): 325 mg via ORAL
  Filled 2015-08-10 (×11): qty 1

## 2015-08-10 MED ORDER — DIGOXIN 125 MCG PO TABS
0.1250 mg | ORAL_TABLET | ORAL | Status: DC
  Administered 2015-08-11 – 2015-08-13 (×2): 0.125 mg via ORAL
  Filled 2015-08-10 (×2): qty 1

## 2015-08-10 MED ORDER — RIFAXIMIN 550 MG PO TABS
550.0000 mg | ORAL_TABLET | Freq: Two times a day (BID) | ORAL | Status: DC
  Administered 2015-08-10 – 2015-08-15 (×9): 550 mg via ORAL
  Filled 2015-08-10 (×13): qty 1

## 2015-08-10 NOTE — H&P (Signed)
[facility] Admission History and Physical Service Pager: [phone]  Patient name: Hunter [name] Medical record number: [id number] Date of birth: [birth year] Age: 80 y.o. Gender: male  Primary Care Provider: No PCP Per Patient Consultants: CHF team, GI Code Status: DNR/DNI (confirmed with patient)  Chief Complaint: Fatigue, weakness  Assessment and Plan: [name] is a 80 y.o. male presenting with fatigue and severe anemia. 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 Suspect worsening of cirrhosis 2/2 CHF. Will transfuse and consult with CHF team and GI for treatment plan.   # Anemia: Hgb 5.5 at GI outpt visit, initial in ED 5.7. Baseline 7-8. Presented at [facility] positive. Vitals with some low BP 80s/40s. However, patient denies dark stools.  - SDU with low BPs - Receiving 2 units packed RBCs.  - Obtain post-transfusion HH.  - Daily CBCs. - Monitor BPs.  - Consult GI if not aware. Appreciate recommendations. - Continue home ferrous sulfate.   # Cirrhosis/liver failure: Believed to be secondary to hepatic AV fistula and CHF. Care everywhere review shows Hep A, B, C negative 07/21/2015. Hospitalized with hepatic encephalopathy 07/17/15 and 06/30/15. Ammonia elevated at 133. (Recent levels have ranged from 83-201) - Continue home lactulose TID, rifaximin BID.  - Consult Palliative Care.   # CHF: ECHO from 02/09/2015 showed LVEF 50-55% with grade 2 diastolic filling pattern, enlarged left and right ventricles, as well as severely dilated right and left atrium and tricuspid and mitral regurgitation. EKG showed pacer spikes and prolonged QTc.  - Repeat ECHO - CHF team consulted, appreciate recommendations.  - Digoxin level 0.6. Consider increased dose.   # Hyponatremia:  appears to be chronic from liver failure. Care everywhere review shows low sodium 120s during hospitalization earlier this month, July in mid/upper 120s. Asymptomatic.  - Chronic. May administer NS IVFs if BP remains low but do not expect to correct.     # Hypotension: SBP 80s/40s in ED - Holding home flomax, torsemide, metolazone, spironolactone.  - Consider IVFs if no response to blood transfusion.   # CKD: SCr 2.5 on admission. SCr baseline per care everywhere review appears to be 1.4 back in March, but most recently 1.8 over past 2 months. - Repeat BMETs. Monitor Cr.   FEN/GI: Regular diet Prophylaxis: promethazine  Disposition: Admitted to [facility] for blood transfusion and care coordination.   History of Present Illness:  Hunter [name] is a 80 y.o. male presenting with fatigue and weakness. He was seen by GI specialist Dr. Carlean Purl yesterday to establish care and was found to have a hemoglobin of 5.5. Dr. Carlean Purl recommended admission. The patient  vomited once in the ED but did not notice any blood in the emesis. He has not noticed any changes in bowel patterns and has not noticed any dark or red stools. No recent nose bleeds, although he has a history of epistaxis. He has been complaining of fatigue but no pain. He and his wife recently moved to [place] from [place], seeking increased access to medical care. He was recently hospitalized in [facility] for hepatic encephalopathy but has been at baseline mental status since starting lactulose and rifaximin. He requires a walker for ambulation at baseline.   Review Of Systems: Per HPI with the following additions: Decreased appetite. No recent sick contacts. Very hard of hearing.  Otherwise 12 point review of  systems was performed and was unremarkable.  Patient Active Problem List   Diagnosis Date Noted  . Anemia 08/10/2015  . Hepatic AV fistula 08/09/2015  . Mitral regurgitation 08/09/2015  . HHT (hereditary  hemorrhagic telangiectasia) 08/09/2015  . Anemia due to chronic blood loss 08/09/2015  . Right heart failure 08/09/2015  . 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 Tricuspid regurgitation 08/09/2015  . Chronic kidney disease 08/09/2015   Past Medical History: Past Medical History  Diagnosis Date  . CHF (congestive heart failure)   . Pacemaker   . HHT (hereditary hemorrhagic telangiectasia)   . Anemia   . Pulmonary hypertension   . Osler-Weber-Rendu syndrome   . Renal insufficiency   . Liver failure   . Atrial fibrillation   . Hepatic AV fistula 08/09/2015  . 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 Anemia due to chronic blood loss 08/09/2015  . Right heart failure 08/09/2015  . Tricuspid regurgitation 08/09/2015   Past Surgical History: Past Surgical History  Procedure Laterality Date  . Pacemaker insertion    . Colon surgery  1196   Social History: Social History  Substance Use Topics  . Smoking status: Never Smoker   . Smokeless tobacco: Never Used  . Alcohol Use: No   Additional social history: Has never used alcohol. Denies drug use.  Please also refer to relevant sections of EMR.  Family History: Family History  Problem Relation Age of Onset  . Other Mother     HHT  . Other Son     HHT  . Heart failure Father    Allergies and Medications: No Known Allergies No current facility-administered medications on file prior to encounter.   Current Outpatient Prescriptions on File Prior to Encounter  Medication Sig Dispense Refill  . b complex-vitamin c-folic acid (NEPHRO-VITE) 0.8 MG TABS tablet Take 1 tablet by mouth daily.    . Cyanocobalamin 1000 MCG/ML KIT Inject 1 Syringe as directed every 30 (thirty) days.    . digoxin (LANOXIN) 0.125 MG tablet Take by mouth. Mon, Wed, Fri    . ferrous sulfate 325 (65 FE) MG EC tablet Take 325 mg by mouth 2 (two) times daily with a meal.     . lactulose (CHRONULAC) 10 GM/15ML solution Take 20 g by mouth 3 (three) times daily.    . Magnesium  250 MG TABS Take 1 tablet by mouth daily.    . metolazone (ZAROXOLYN) 2.5 MG tablet Take 2.5 mg by mouth. Mon and Fri    . potassium chloride (MICRO-K) 10 MEQ CR capsule Take 10 mEq by mouth 2 (two) times daily.    . rifaximin (XIFAXAN) 550 MG TABS tablet Take 550 mg by mouth 2 (two) times daily.    [name] spironolactone (ALDACTONE) 25 MG tablet Take 25 mg by mouth daily.     . tamsulosin (FLOMAX) 0.4 MG CAPS capsule Take 0.4 mg by mouth.    . torsemide (DEMADEX) 100 MG tablet Take 100 mg by mouth as directed.     . vorinostat (ZOLINZA) 100 MG capsule Take 400 mg by mouth daily. Take with food.      Objective: BP 97/39 mmHg  Pulse 80  Temp(Src) 97.6 F (36.4 C) (Oral)  Resp 10  Ht _0  (1.803 m)  Wt 172 lb (78.019 kg)  BMI 24.00 kg/m2  SpO2 100% Exam: General: Ill-appearing man laying in hospital bed in NAD.  HEENT: NCAT. No scleral icterus. MMM.  Cardiovascular: RRR. No murmurs, rubs or gallops. 1+ pedal pulses.  Respiratory: CTAB.  Abdomen: +BS, soft, nontender, non-distended. MSK: + asterixis Skin: Pale nail beds. Jaundice. Diffuse telangiectasias.  Neuro: AOx3.  Psych: Flat affect.   Labs and Imaging: CBC BMET   Recent Labs Lab 08/10/15 1320 08/10/15 1337  WBC 3.9*  --   HGB 5.7* 6.5*  HCT 18.3* 19.0*  PLT 64*  --     Recent Labs Lab 08/10/15 1320 08/10/15 1337  NA 124* 122*  K 4.6 4.5  CL 88* 84*  CO2 27  --   BUN 70* 74*  CREATININE 2.39* 2.50*  GLUCOSE 121* 114*  CALCIUM 8.7*  --       Rogue Bussing, MD 08/10/2015, 3:05 PM PGY-1, [place] Intern pager: [phone], text pages welcome  I have seen and examined the patient. I have read and agree with the above note. My changes are noted in blue.  Tawanna Sat, MD 08/10/2015, 7:39 PM PGY-3, [place] Intern Pager: [phone], text pages welcome

## 2015-08-10 NOTE — ED Notes (Signed)
Pt here for low hgb and hypokalemia; pt noted hypotensive and vomiting in triage; pt sts liver and kidney issues

## 2015-08-10 NOTE — ED Provider Notes (Signed)
CSN: [id number]     Arrival date & time 08/10/15  1307 History   First MD Initiated Contact with Patient 08/10/15 1332     Chief Complaint  Patient presents with  . Abnormal Lab  . Hypotension     (Consider location/radiation/quality/duration/timing/severity/associated sxs/prior Treatment) HPI Comments: Patient with a complicated medical history of CHF, pulmonary hypertension, liver failure, chronic kidney disease and and hereditary telangiectasia presents with weakness and shortness of breath. He was seen yesterday at Dr. Celesta Aver office for establishment of care for his liver cirrhosis. He recently moved from [place] about 2 weeks ago. At Dr. Celesta Aver office, he had labs which showed a marked anemia of 5 which is lower than his baseline anemia as well as a low sodium. He was sent over here for admission. He reports some increased fatigue and generalized weakness. He does have some shortness of breath but says it's close to baseline. His wife states that his blood pressure is always in the 90 to low 100 range. He was noted to be hypotensive at Dr. Celesta Aver office yesterday and today in the emergency department. He does report any fevers. No productive cough. No abdominal pain or swelling. No confusion. He was admitted about 2 weeks ago to a hospital in [place] for hepatic encephalopathy.   Past Medical History  Diagnosis Date  . CHF (congestive heart failure)   . Pacemaker   . HHT (hereditary hemorrhagic telangiectasia)   . Anemia   . Pulmonary hypertension   . Osler-Weber-Rendu syndrome   . Renal insufficiency   . Liver failure   . Atrial fibrillation   . Hepatic AV fistula 08/09/2015  . 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 Anemia due to chronic blood loss 08/09/2015  . Right heart failure 08/09/2015  . Tricuspid regurgitation 08/09/2015   Past Surgical History  Procedure Laterality Date  . Pacemaker insertion    . Colon surgery  1196   Family History  Problem Relation Age of Onset   . Other Mother     HHT  . Other Son     HHT  . Heart failure Father    Social History  Substance Use Topics  . Smoking status: Never Smoker   . Smokeless tobacco: Never Used  . Alcohol Use: No    Review of Systems  Constitutional: Positive for fatigue. Negative for fever, chills and diaphoresis.  HENT: Negative for congestion, rhinorrhea and sneezing.   Eyes: Negative.   Respiratory: Positive for cough and shortness of breath. Negative for chest tightness.   Cardiovascular: Negative for chest pain and leg swelling.  Gastrointestinal: Negative for nausea, vomiting, abdominal pain, diarrhea and blood in stool.  Genitourinary: Negative for frequency, hematuria, flank pain and difficulty urinating.  Musculoskeletal: Negative for back pain and arthralgias.  Skin: Negative for rash.  Neurological: Negative for dizziness, speech difficulty, weakness, numbness and headaches.      Allergies  Review of patient's allergies indicates no known allergies.  Home Medications   Prior to Admission medications   Medication Sig Start Date End Date Taking? Authorizing Provider  b complex-vitamin c-folic acid (NEPHRO-VITE) 0.8 MG TABS tablet Take 1 tablet by mouth daily.    Historical Provider, MD  Cyanocobalamin 1000 MCG/ML KIT Inject 1 Syringe as directed every 30 (thirty) days.    Historical Provider, MD  digoxin (LANOXIN) 0.125 MG tablet Take by mouth. Mon, Wed, Fri    Historical Provider, MD  ferrous sulfate 325 (65 FE) MG EC tablet Take 325 mg by mouth 2 (two)  times daily with a meal.     Historical Provider, MD  lactulose (CHRONULAC) 10 GM/15ML solution Take 20 g by mouth 3 (three) times daily.    Historical Provider, MD  Magnesium 250 MG TABS Take 1 tablet by mouth daily.    Historical Provider, MD  metolazone (ZAROXOLYN) 2.5 MG tablet Take 2.5 mg by mouth. Mon and Fri    Historical Provider, MD  potassium chloride (MICRO-K) 10 MEQ CR capsule Take 10 mEq by mouth 2 (two) times daily.     Historical Provider, MD  rifaximin (XIFAXAN) 550 MG TABS tablet Take 550 mg by mouth 2 (two) times daily.    Historical Provider, MD  spironolactone (ALDACTONE) 25 MG tablet Take 25 mg by mouth daily.     Historical Provider, MD  tamsulosin (FLOMAX) 0.4 MG CAPS capsule Take 0.4 mg by mouth.    Historical Provider, MD  torsemide (DEMADEX) 100 MG tablet Take 100 mg by mouth as directed.     Historical Provider, MD  vorinostat (ZOLINZA) 100 MG capsule Take 400 mg by mouth daily. Take with food.    Historical Provider, MD   BP 107/62 mmHg  Pulse 81  Temp(Src) 97.2 F (36.2 C) (Oral)  Resp 10  Ht _0  (1.803 m)  Wt 172 lb (78.019 kg)  BMI 24.00 kg/m2  SpO2 100% Physical Exam  Constitutional: He is oriented to person, place, and time. He appears well-developed and well-nourished.  HENT:  Head: Normocephalic and atraumatic.  Eyes: Pupils are equal, round, and reactive to light.  Neck: Normal range of motion. Neck supple.  Cardiovascular: Normal rate and regular rhythm.   Murmur heard. Pulmonary/Chest: Effort normal and breath sounds normal. No respiratory distress. He has no wheezes. He has no rales. He exhibits no tenderness.  Abdominal: Soft. Bowel sounds are normal. There is no tenderness. There is no rebound and no guarding.  Musculoskeletal: Normal range of motion. He exhibits no edema.  Lymphadenopathy:    He has no cervical adenopathy.  Neurological: He is alert and oriented to person, place, and time.  Skin: Skin is warm and dry. No rash noted.  Psychiatric: He has a normal mood and affect.    ED Course  Procedures (including critical care time) Labs Review Labs Reviewed  CBC - Abnormal; Notable for the following:    WBC 3.9 (*)    RBC 2.13 (*)    Hemoglobin 5.7 (*)    HCT 18.3 (*)    RDW 20.5 (*)    All other components within normal limits  COMPREHENSIVE METABOLIC PANEL - Abnormal; Notable for the following:    Sodium 124 (*)    Chloride 88 (*)    Glucose, Bld 121  (*)    BUN 70 (*)    Creatinine, Ser 2.39 (*)    Calcium 8.7 (*)    Total Protein 4.8 (*)    Albumin 2.6 (*)    Alkaline Phosphatase 175 (*)    Total Bilirubin 2.5 (*)    GFR calc non Af Amer 25 (*)    GFR calc Af Amer 28 (*)    All other components within normal limits  AMMONIA - Abnormal; Notable for the following:    Ammonia 133 (*)    All other components within normal limits  PROTIME-INR - Abnormal; Notable for the following:    Prothrombin Time 16.6 (*)    All other components within normal limits  CBG MONITORING, ED - Abnormal; Notable for the following:  Glucose-Capillary 124 (*)    All other components within normal limits  I-STAT CHEM 8, ED - Abnormal; Notable for the following:    Sodium 122 (*)    Chloride 84 (*)    BUN 74 (*)    Creatinine, Ser 2.50 (*)    Glucose, Bld 114 (*)    Calcium, Ion 1.11 (*)    Hemoglobin 6.5 (*)    HCT 19.0 (*)    All other components within normal limits  POC OCCULT BLOOD, ED - Abnormal; Notable for the following:    Fecal Occult Bld POSITIVE (*)    All other components within normal limits  APTT  URINALYSIS, ROUTINE W REFLEX MICROSCOPIC (NOT AT [facility])  DIGOXIN LEVEL  I-STAT CG4 LACTIC ACID, ED  CBG MONITORING, ED  TYPE AND SCREEN  PREPARE RBC (CROSSMATCH)    Imaging Review No results found. I have personally reviewed and evaluated these images and lab results as part of my medical decision-making.   EKG Interpretation   Date/Time:  Tuesday August 10 2015 13:22:05 EDT Ventricular Rate:  83 PR Interval:    QRS Duration: 181 QT Interval:  429 QTC Calculation: 504 R Axis:   139 Text Interpretation:  Ventricular paced rhthym Nonspecific  intraventricular conduction delay Repol abnrm, probable ischemia, lateral  leads since last tracing no significant change Confirmed by Carlisa Eble  MD,  Xinyi Batton ([id number]) on 08/10/2015 1:53:33 PM      MDM   Final diagnoses:  Iron deficiency anemia due to chronic blood loss  Hyponatremia     Patient with significant comorbidities presents with worsening chronic anemia. His hemoglobin is 5.7 today. This is lower than his baseline values. His Hemoccult is positive but he has no gross blood in stool. He does report increased shortness of breath or other symptoms consistent with acute CHF. His EKG does not show any ischemic changes. His sodium is low. It appears he has some chronic hyponatremia but it's lower than his last values. I spoke with the family medicine resident who will admit the patient is unassigned. He was type and cross for 2 units of packed RBCs. His blood pressure has improved in the ED with the last one being 107/62. I did not give him IV fluid boluses as he does have an improving blood pressure and will be getting transfusion.    Malvin Johns, MD 08/10/15 [phone]

## 2015-08-10 NOTE — Consult Note (Signed)
Advanced Heart Failure Team Consult Note  Referring Physician: Su Ley Primary Cardiologist:  Hunter [name]  Reason for Consultation: Worsening cirrhosis presumed 2/2 CHF.  HPI:    Hunter [name] is a 80 y.o. male with a history of diastolic HF Echo 02/16/64 EF 45-50% NYHA class III, pulmonary hypertension, liver failure, CKD stage III, and hereditary hemorrhagic telangiectasia with hepatic AVMs and associated anemia requiring transfusion..  Pt moved from [name] less than 2 weeks ago to be closer to medical care. He was seen on 8/29 by [name] with [name] to establish for his liver cirrhosis.  Labs drawn at his clinic were significant for marked anemia with Hemoglobin of 5 and hyponatremia. They could not reach Hunter [name] by telephone until the morning of 8/30 and he reported to [facility] ED. He reported worsening fatigue and weakness. He has DOE at baseline. He is normally hypotensive with SBPs in the 90-100 range.  Pertinent admission labs include K 4.5, Creatinine 2.5, BNP 188.0, and Hgb 5.7.  He states that his breathing has been good.  He gets around the house with use of a walker and assistance from his wife, but does not go out. He is not SOB at rest.  He is note sure what caused his HF.  He has never had an MI. No smoking, drinking, or drugs. He is very fatigued at baseline. He denies Orthopnea/PND/CP.  He has dizziness with rapid standing from sitting. He has had anemia for > than a year but they have not been able to figure out the cause.  He had been taking 100 mg of Torsemide as needed for swelling, and would use it a couple times a week.  His weight at home had been stable at around 165. SBPs at home 90-100.   Echo 8/31 45-50 %. RV normal. Marked biatrial enlargement with probable restrictive filling pattern. Moderate to severe MR. Moderate TR. PA peak pressure 51 mm Hg.  Records from [place]: Echo 3/16 EF 50-55%, Grade 2 DD, RV severely dilated Echo 3/15 EF 55-60%,  mod enlarged RV, RV systolic function low normal. Increased LV filling pressures, Moderate MR  RHC 01/19/15 RA 24/18, (20) RV 45/20 PA 45/30 (35) PCW systole 35 mean 30  RHC 06/17/45 RA systolic 24, mean 18 RV 96/29 PA 50/25 (34) PCW 28 mean   Review of Systems: [y] = yes, '[ ]'  = no   General: Weight gain [y]; Weight loss '[ ]' ; Anorexia '[ ]' ; Fatigue [y]; Fever '[ ]' ; Chills '[ ]' ; Weakness '[ ]'   Cardiac: Chest pain/pressure '[ ]' ; Resting SOB '[ ]' ; Exertional SOB [y]; Orthopnea '[ ]' ; Pedal Edema [y]; Palpitations '[ ]' ; Syncope '[ ]' ; Presyncope '[ ]' ; Paroxysmal nocturnal dyspnea'[ ]'   Pulmonary: Cough '[ ]' ; Wheezing'[ ]' ; Hemoptysis'[ ]' ; Sputum '[ ]' ; Snoring '[ ]'   GI: Vomiting'[ ]' ; Dysphagia'[ ]' ; Melena'[ ]' ; Hematochezia '[ ]' ; Heartburn'[ ]' ; Abdominal pain '[ ]' ; Constipation '[ ]' ; Diarrhea '[ ]' ; BRBPR '[ ]'   GU: Hematuria'[ ]' ; Dysuria '[ ]' ; Nocturia'[ ]'   Vascular: Pain in legs with walking '[ ]' ; Pain in feet with lying flat '[ ]' ; Non-healing sores '[ ]' ; Stroke '[ ]' ; TIA '[ ]' ; Slurred speech '[ ]' ;  Neuro: Headaches'[ ]' ; Vertigo'[ ]' ; Seizures'[ ]' ; Paresthesias'[ ]' ;Blurred vision '[ ]' ; Diplopia '[ ]' ; Vision changes '[ ]'   Ortho/Skin: Arthritis '[ ]' ; Joint pain '[ ]' ; Muscle pain '[ ]' ; Joint swelling '[ ]' ; Back Pain '[ ]' ; Rash '[ ]'   Psych: Depression'[ ]' ; Anxiety'[ ]'   Heme: Bleeding problems '[ ]' ; Clotting disorders '[ ]' ; Anemia '[ ]'   Endocrine: Diabetes '[ ]' ; Thyroid dysfunction'[ ]'   Home Medications Prior to Admission medications   Medication Sig Start Date End Date Taking? Authorizing Provider  b complex-vitamin c-folic acid (NEPHRO-VITE) 0.8 MG TABS tablet Take 1 tablet by mouth daily.   Yes Historical Provider, MD  Cyanocobalamin 1000 MCG/ML KIT Inject 1 Syringe as directed every 30 (thirty) days.   Yes Historical Provider, MD  digoxin (LANOXIN) 0.125 MG tablet Take 0.125 mg by mouth 3 (three) times a week. Mon, Wed, Fri   Yes Historical Provider, MD  ferrous sulfate 325 (65 FE) MG EC tablet Take 325 mg by mouth 2 (two) times daily with a meal.     Yes Historical Provider, MD  lactulose (CHRONULAC) 10 GM/15ML solution Take 20 g by mouth 3 (three) times daily.   Yes Historical Provider, MD  Magnesium 250 MG TABS Take 1 tablet by mouth daily.   Yes Historical Provider, MD  metolazone (ZAROXOLYN) 2.5 MG tablet Take 2.5 mg by mouth 2 (two) times a week. Mon and Fri   Yes Historical Provider, MD  potassium chloride (MICRO-K) 10 MEQ CR capsule Take 10 mEq by mouth 2 (two) times daily.   Yes Historical Provider, MD  promethazine (PHENERGAN) 25 MG tablet Take 6.25 mg by mouth daily as needed for nausea or vomiting.  07/27/15  Yes Historical Provider, MD  rifaximin (XIFAXAN) 550 MG TABS tablet Take 550 mg by mouth 2 (two) times daily.   Yes Historical Provider, MD  spironolactone (ALDACTONE) 25 MG tablet Take 25 mg by mouth daily.    Yes Historical Provider, MD  tamsulosin (FLOMAX) 0.4 MG CAPS capsule Take 0.4 mg by mouth.   Yes Historical Provider, MD  torsemide (DEMADEX) 100 MG tablet Take 100 mg by mouth daily as needed (fluid).    Yes Historical Provider, MD    Past Medical History: Past Medical History  Diagnosis Date  . CHF (congestive heart failure)   . Pacemaker     single lead Medtronic pacemaker  . HHT (hereditary hemorrhagic telangiectasia)   . Anemia   . Pulmonary hypertension   . Osler-Weber-Rendu syndrome   . Renal insufficiency   . Liver failure   . Atrial fibrillation   . Hepatic AV fistula 08/09/2015  . 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 Anemia due to chronic blood loss 08/09/2015  . Right heart failure 08/09/2015  . Tricuspid regurgitation 08/09/2015  . Osler-Weber-Rendu syndrome     Past Surgical History: Past Surgical History  Procedure Laterality Date  . Pacemaker insertion    . Colon surgery  1196    Family History: Family History  Problem Relation Age of Onset  . Other Mother     HHT  . Other Son     HHT  . Heart failure Father     Social History: Social History   Social History  . Marital Status:  Married    Spouse Name: N/A  . Number of Children: 1  . Years of Education: N/A   Occupational History  . retired    Social History Main Topics  . Smoking status: Never Smoker   . Smokeless tobacco: Never Used  . Alcohol Use: No  . Drug Use: No  . Sexual Activity: Yes   Other Topics Concern  . None   Social History Narrative   Married 1 son   Retired Therapist, art   One caffeinated beverage a day no  alcohol   He was living in [facility] at the [place] on [address] met in [place] and has located to [place] recently.   08/09/2015       Allergies:  No Known Allergies  Objective:    Vital Signs:   Temp:  [96.2 F (35.7 C)-98.5 F (36.9 C)] 98.1 F (36.7 C) (08/31 1443) Pulse Rate:  [78-86] 83 (08/31 1443) Resp:  [11-20] 17 (08/31 1443) BP: (85-101)/(36-55) 94/41 mmHg (08/31 1443) SpO2:  [96 %-100 %] 100 % (08/31 1443) Weight:  [175 lb 11.3 oz (79.7 kg)-176 lb 4.8 oz (79.969 kg)] 175 lb 11.3 oz (79.7 kg) (08/31 0626) Last BM Date: 08/11/15  Weight change: Filed Weights   08/10/15 1314 08/10/15 1817 08/11/15 0626  Weight: 172 lb (78.019 kg) 176 lb 4.8 oz (79.969 kg) 175 lb 11.3 oz (79.7 kg)    Intake/Output:   Intake/Output Summary (Last 24 hours) at 08/11/15 1525 Last data filed at 08/11/15 1344  Gross per 24 hour  Intake 1160.91 ml  Output    900 ml  Net 260.91 ml     Physical Exam: General:  Elderly, Chronically ill, and fatigued appearing. HEENT: normal x for diffuse telengectasias. pale conjunctiva Neck: supple. JVP 12 . Carotids 2+ bilat; no bruits. No lymphadenopathy or thryomegaly appreciated. Cor: PMI nondisplaced. Regular rate & rhythm. II/VI SEM at LUSB and apex Lungs: Diminished bibasilarly Abdomen: soft, nontender, mild distention. No hepatosplenomegaly. No bruits or masses. Good bowel sounds. Extremities: no cyanosis, clubbing, rash. 1+ LE edema. Neuro: alert & orientedx3, cranial nerves grossly intact. moves all 4  extremities w/o difficulty. Affect flat. +asterixis  Telemetry: V paced 80s with PVCs  Labs: Basic Metabolic Panel:  Recent Labs Lab 08/09/15 1621 08/10/15 1320 08/10/15 1337 08/11/15 0532  NA 122* 124* 122* 123*  K 4.6 4.6 4.5 4.0  CL 87* 88* 84* 85*  CO2 29 27  --  27  GLUCOSE 107* 121* 114* 90  BUN 70* 70* 74* 65*  CREATININE 2.19* 2.39* 2.50* 2.18*  CALCIUM 8.4 8.7*  --  8.1*    Liver Function Tests:  Recent Labs Lab 08/09/15 1621 08/10/15 1320  AST 30 40  ALT 26 29  ALKPHOS 176* 175*  BILITOT 2.0* 2.5*  PROT 4.9* 4.8*  ALBUMIN 2.8* 2.6*   No results for input(s): LIPASE, AMYLASE in the last 168 hours.  Recent Labs Lab 08/09/15 1621 08/10/15 1320  AMMONIA 113* 133*    CBC:  Recent Labs Lab 08/09/15 1621 08/10/15 1320 08/10/15 1337 08/10/15 2236 08/11/15 0532 08/11/15 1434  WBC 3.6* 3.9*  --   --  3.1* 3.5*  NEUTROABS 2.7  --   --   --   --   --   HGB 5.5 Repeated and verified X2.* 5.7* 6.5* 6.4* 7.2* 7.0*  HCT 17.3 Repeated and verified X2.* 18.3* 19.0* 20.3* 22.6* 21.7*  MCV 84.9 85.9  --   --  85.3 85.1  PLT 56.0 Repeated and verified X2.* 64*  --   --  50* 49*    Cardiac Enzymes: No results for input(s): CKTOTAL, CKMB, CKMBINDEX, TROPONINI in the last 168 hours.  BNP: BNP (last 3 results) No results for input(s): BNP in the last 8760 hours.  ProBNP (last 3 results)  Recent Labs  08/09/15 1621  PROBNP 188.0*     CBG:  Recent Labs Lab 08/10/15 1320  GLUCAP 124*    Coagulation Studies:  Recent Labs  08/09/15 1621 08/10/15 1320  LABPROT 13.5* 16.6*  INR 1.2* 1.33    Other results: EKG: 08/10/15 V pacing at 83 bpm. Suspect underlying AF  Imaging:  No results found.   Medications:     Current Medications: . digoxin  0.125 mg Oral Once per day on Mon Wed Fri  . feeding supplement (ENSURE ENLIVE)  237 mL Oral BID BM  . ferrous sulfate  325 mg Oral BID WC  . [START ON 08/12/2015] Influenza vac split  quadrivalent PF  0.5 mL Intramuscular Tomorrow-1000  . lactulose  20 g Oral TID  . multivitamin  1 tablet Oral QHS  . [START ON 08/12/2015] pantoprazole  40 mg Oral Daily  . rifaximin  550 mg Oral BID  . sodium chloride  3 mL Intravenous Q12H     Infusions:      Assessment/Plan   1. Chronic diastolic CHF Echo 7/58/83 45-50%.  2. Pulmonary hypertension on Echo 08/11/15 PA peak pressure 51 mm Hg 3. Liver failure / cirrhosis 4. CKD Stage III 5. Marked Anemia 6. Hereditary telangiectasia. 7. Hepatic encephalopathy  Hgb 7.0 today after 3 units of RBC.  He is slightly volume overloaded on exam.  Will give IV lasix 80 mg today and reassess in am, follow CKD closely. Supp K as needed.  Difficult to assess etiology of fatigue with multiple comorbidities but EF of 45-50% reassuring from HF standpoint.    Suspect some component of failure to thrive as well as chronic anemia contributing to decreased energy level.  Agree with palliative consult for goals of care.  We will follow.   Length of Stay: 1  Shirley Friar PA-C 08/11/2015, 3:25 PM  Advanced Heart Failure Team Pager [phone] (M-F; 7a - 4p)  Please contact [facility] for night-coverage after hours (4p -7a ) and weekends on [email or web address]  Patient seen and examined with Oda Kilts, PA-C. We discussed all aspects of the encounter. I agree with the assessment and plan as stated above.   Echo images and outside records reviewed personally. Mr. Pender appears to have end-stage diastolic heart failure in the setting of restrictive CM c/b cardiac cirrhosis and HHT. That said HF has been reasonable stable as of late. Main issues currently are profound anemia and worsening encephalopathy. He has been transfused but initially refused EGD - now agreeing to it. Has decided on DNR.  Will likely need another unit RBCs and more aggressive therapy for his encephalopathy. (marked tremor and asterixis on exam with ammonia ~ 130)    Unfortunately not many other options for him from a cardiac standpoint outside of careful diuretic management.   We will follow    Bensimhon, Daniel,MD 6:38 PM

## 2015-08-10 NOTE — Progress Notes (Signed)
Quick Note:  Needs admission - top go to ED at [facility] re: anemia, hyponatremia  i called multiple x last PM and again this AM - get message that not accepting calls at this time on phone #  Please retry ______

## 2015-08-10 NOTE — Telephone Encounter (Signed)
I left a message for Timmothy Sours that the patient has been admitted

## 2015-08-10 NOTE — ED Notes (Signed)
Pt to transport to Step down unit per admitting service

## 2015-08-11 ENCOUNTER — Inpatient Hospital Stay (HOSPITAL_COMMUNITY): Payer: Medicare Other

## 2015-08-11 DIAGNOSIS — E871 Hypo-osmolality and hyponatremia: Secondary | ICD-10-CM

## 2015-08-11 DIAGNOSIS — R195 Other fecal abnormalities: Secondary | ICD-10-CM

## 2015-08-11 DIAGNOSIS — D649 Anemia, unspecified: Secondary | ICD-10-CM

## 2015-08-11 DIAGNOSIS — I509 Heart failure, unspecified: Secondary | ICD-10-CM

## 2015-08-11 DIAGNOSIS — K7460 Unspecified cirrhosis of liver: Secondary | ICD-10-CM

## 2015-08-11 DIAGNOSIS — Z515 Encounter for palliative care: Secondary | ICD-10-CM

## 2015-08-11 LAB — BASIC METABOLIC PANEL
Anion gap: 11 (ref 5–15)
BUN: 65 mg/dL — ABNORMAL HIGH (ref 6–20)
CO2: 27 mmol/L (ref 22–32)
Calcium: 8.1 mg/dL — ABNORMAL LOW (ref 8.9–10.3)
Chloride: 85 mmol/L — ABNORMAL LOW (ref 101–111)
Creatinine, Ser: 2.18 mg/dL — ABNORMAL HIGH (ref 0.61–1.24)
GFR calc Af Amer: 32 mL/min — ABNORMAL LOW (ref >60)
GFR calc non Af Amer: 27 mL/min — ABNORMAL LOW (ref >60)
Glucose, Bld: 90 mg/dL (ref 65–99)
Potassium: 4 mmol/L (ref 3.5–5.1)
Sodium: 123 mmol/L — ABNORMAL LOW (ref 135–145)

## 2015-08-11 LAB — CBC
HCT: 22.6 % — ABNORMAL LOW (ref 39.0–52.0)
HCT: 21.7 % — ABNORMAL LOW (ref 39.0–52.0)
HEMOGLOBIN: 7 g/dL — AB (ref 13.0–17.0)
Hemoglobin: 7.2 g/dL — ABNORMAL LOW (ref 13.0–17.0)
MCH: 27.2 pg (ref 26.0–34.0)
MCH: 27.5 pg (ref 26.0–34.0)
MCHC: 31.9 g/dL (ref 30.0–36.0)
MCHC: 32.3 g/dL (ref 30.0–36.0)
MCV: 85.3 fL (ref 78.0–100.0)
MCV: 85.1 fL (ref 78.0–100.0)
Platelets: 50 10*3/uL — ABNORMAL LOW (ref 150–400)
Platelets: 49 10*3/uL — ABNORMAL LOW (ref 150–400)
RBC: 2.65 MIL/uL — ABNORMAL LOW (ref 4.22–5.81)
RBC: 2.55 MIL/uL — AB (ref 4.22–5.81)
RDW: 18.4 % — ABNORMAL HIGH (ref 11.5–15.5)
RDW: 18.4 % — ABNORMAL HIGH (ref 11.5–15.5)
WBC: 3.1 10*3/uL — ABNORMAL LOW (ref 4.0–10.5)
WBC: 3.5 10*3/uL — ABNORMAL LOW (ref 4.0–10.5)

## 2015-08-11 LAB — PREPARE RBC (CROSSMATCH)

## 2015-08-11 MED ORDER — PANTOPRAZOLE SODIUM 40 MG PO TBEC
40.0000 mg | DELAYED_RELEASE_TABLET | Freq: Two times a day (BID) | ORAL | Status: DC
  Administered 2015-08-11: 40 mg via ORAL
  Filled 2015-08-11: qty 1

## 2015-08-11 MED ORDER — PANTOPRAZOLE SODIUM 40 MG PO TBEC
40.0000 mg | DELAYED_RELEASE_TABLET | Freq: Every day | ORAL | Status: DC
  Administered 2015-08-12 – 2015-08-15 (×4): 40 mg via ORAL
  Filled 2015-08-11 (×4): qty 1

## 2015-08-11 MED ORDER — INFLUENZA VAC SPLIT QUAD 0.5 ML IM SUSY
0.5000 mL | PREFILLED_SYRINGE | INTRAMUSCULAR | Status: AC
  Administered 2015-08-12: 0.5 mL via INTRAMUSCULAR
  Filled 2015-08-11: qty 0.5

## 2015-08-11 MED ORDER — SODIUM CHLORIDE 0.9 % IV SOLN
Freq: Once | INTRAVENOUS | Status: AC
  Administered 2015-08-11: 04:00:00 via INTRAVENOUS

## 2015-08-11 MED ORDER — TORSEMIDE 20 MG PO TABS
60.0000 mg | ORAL_TABLET | Freq: Every day | ORAL | Status: DC
  Administered 2015-08-11 – 2015-08-13 (×3): 60 mg via ORAL
  Filled 2015-08-11 (×3): qty 3

## 2015-08-11 MED ORDER — SPIRONOLACTONE 25 MG PO TABS
25.0000 mg | ORAL_TABLET | Freq: Every day | ORAL | Status: DC
  Administered 2015-08-11 – 2015-08-15 (×5): 25 mg via ORAL
  Filled 2015-08-11 (×5): qty 1

## 2015-08-11 NOTE — Progress Notes (Signed)
  Echocardiogram 2D Echocardiogram has been performed.  Hunter [name] 08/11/2015, 11:23 AM

## 2015-08-11 NOTE — Progress Notes (Signed)
UR Completed Tali Coster Graves-Bigelow, RN,BSN [phone]  

## 2015-08-11 NOTE — Consult Note (Signed)
Consultation Note Date: 08/11/2015   Patient Name: Hunter [name]  DOB: [birth year]  MRN: [id number]  Age / Sex: 80 y.o., male   PCP: No Pcp Per Patient Referring Physician: No att. providers found  Reason for Consultation: Establishing goals of care  Palliative Care Assessment and Plan Summary of Established Goals of Care and Medical Treatment Preferences    Palliative Care Discussion Held Today:   I met today with Mr. & Mrs. Fouche (although Mr. Ulbrich drifted in and out of sleep during our conversation). My hopes today are to begin to establish rapport and learn about Mr. Liotta life values.   Mrs. Haq tells me about how they met and that they have been married 8 yrs now (his 1st wife died of cancer). It is clear how devoted they are to each other. They have moved to [facility] within the past week to be closer to medical care (her son lives in [place] and they were previously ~1 hr away from hospital/pharmacy).   She tells me how he stays very fatigued and sleeps often throughout the day. Finds very little enjoyment in day to day past being with her. He was previously an Art gallery manager and was very active with golf, biking, being outdoors. They now have a [place]house [place] a [place]. We discussed that he may find enjoyment in setting up to watch the golf course even though he is unable to play.   He tells me that his one desire is "to take my wife out to dinner." He misses being able to get out of the house as they used to enjoy doing things together - "outings." He also clearly acknowledges that he knows he has a poor prognosis and time is limited (he is more forward with discussing this than his wife - she was very tearful). He is most worried about leaving his wife when he dies. Encouraged them to take one day at a time and to continue to live and make memories with the time he has left. I will continue to follow.    Contacts/Participants in Discussion: Primary Decision  Maker: Self and then wife  Goals of Care/Code Status/Advance Care Planning:   Code Status: DNR - wife confirms this is his decision and she respects this   Symptom Management:   Fatigue: Unfortunately this may not be very very reversible. Allow adequate time for rest.  Decreased appetite: Continue small frequent meals as tolerated. Continue Ensure.   Denies pain.   Psycho-social/Spiritual:   Support System: Wife is extremely supportive but needs some help. She is able to hire private help to assist with self care.   Prognosis: Prognosis is very poor - I believe he could be eligible for hospice. I did not bring this up today.   Discharge Planning: To be determined.        Chief Complaint/HPI: 80 yo male with cirrhosis, diastolic heart failure, s/p pacemaker, atrial fibrillation, hereditary hemorrhagic telangiectasia, osler weber rendu syndrome. Unfortunately he is having functional decline and QOL has been suffering.   Primary Diagnoses  Present on Admission:  . Anemia . Severe anemia  Palliative Review of Systems:   + fatigue   I have reviewed the medical record, interviewed the patient and family, and examined the patient. The following aspects are pertinent.  Past Medical History  Diagnosis Date  . CHF (congestive heart failure)   . Pacemaker     single lead Medtronic pacemaker  . HHT (hereditary hemorrhagic telangiectasia)   . Anemia   .  Pulmonary hypertension   . Osler-Weber-Rendu syndrome   . Renal insufficiency   . Liver failure   . Atrial fibrillation   . Hepatic AV fistula 08/09/2015  . 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 Anemia due to chronic blood loss 08/09/2015  . Right heart failure 08/09/2015  . Tricuspid regurgitation 08/09/2015  . Osler-Weber-Rendu syndrome    Social History   Social History  . Marital Status: Married    Spouse Name: N/A  . Number of Children: 1  . Years of Education: N/A   Occupational History  . retired    Social History  Main Topics  . Smoking status: Never Smoker   . Smokeless tobacco: Never Used  . Alcohol Use: No  . Drug Use: No  . Sexual Activity: Yes   Other Topics Concern  . 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   08/09/2015      Family History  Problem Relation Age of Onset  . Other Mother     HHT  . Other Son     HHT  . Heart failure Father    Scheduled Meds: . digoxin  0.125 mg Oral Once per day on Mon Wed Fri  . feeding supplement (ENSURE ENLIVE)  237 mL Oral BID BM  . ferrous sulfate  325 mg Oral BID WC  . [START ON 08/12/2015] Influenza vac split quadrivalent PF  0.5 mL Intramuscular Tomorrow-1000  . lactulose  20 g Oral TID  . multivitamin  1 tablet Oral QHS  . [START ON 08/12/2015] pantoprazole  40 mg Oral Daily  . rifaximin  550 mg Oral BID  . sodium chloride  3 mL Intravenous Q12H   Continuous Infusions:  PRN Meds:.promethazine Medications Prior to Admission:  Prior to Admission medications   Medication Sig Start Date End Date Taking? Authorizing Provider  b complex-vitamin c-folic acid (NEPHRO-VITE) 0.8 MG TABS tablet Take 1 tablet by mouth daily.   Yes Historical Provider, MD  Cyanocobalamin 1000 MCG/ML KIT Inject 1 Syringe as directed every 30 (thirty) days.   Yes Historical Provider, MD  digoxin (LANOXIN) 0.125 MG tablet Take 0.125 mg by mouth 3 (three) times a week. Mon, Wed, Fri   Yes Historical Provider, MD  ferrous sulfate 325 (65 FE) MG EC tablet Take 325 mg by mouth 2 (two) times daily with a meal.    Yes Historical Provider, MD  lactulose (CHRONULAC) 10 GM/15ML solution Take 20 g by mouth 3 (three) times daily.   Yes Historical Provider, MD  Magnesium 250 MG TABS Take 1 tablet by mouth daily.   Yes Historical Provider, MD  metolazone (ZAROXOLYN) 2.5 MG  tablet Take 2.5 mg by mouth 2 (two) times a week. Mon and Fri   Yes Historical Provider, MD  potassium chloride (MICRO-K) 10 MEQ CR capsule Take 10 mEq by mouth 2 (two) times daily.   Yes Historical Provider, MD  promethazine (PHENERGAN) 25 MG tablet Take 6.25 mg by mouth daily as needed for nausea or vomiting.  07/27/15  Yes Historical Provider, MD  rifaximin (XIFAXAN) 550 MG TABS tablet Take 550 mg by mouth 2 (two) times daily.   Yes Historical Provider, MD  spironolactone (ALDACTONE) 25 MG tablet Take 25 mg by mouth daily.    Yes  Historical Provider, MD  tamsulosin (FLOMAX) 0.4 MG CAPS capsule Take 0.4 mg by mouth.   Yes Historical Provider, MD  torsemide (DEMADEX) 100 MG tablet Take 100 mg by mouth daily as needed (fluid).    Yes Historical Provider, MD   No Known Allergies CBC:    Component Value Date/Time   WBC 3.5* 08/11/2015 1434   HGB 7.0* 08/11/2015 1434   HCT 21.7* 08/11/2015 1434   PLT 49* 08/11/2015 1434   MCV 85.1 08/11/2015 1434   NEUTROABS 2.7 08/09/2015 1621   LYMPHSABS 0.3* 08/09/2015 1621   MONOABS 0.7 08/09/2015 1621   EOSABS 0.0 08/09/2015 1621   BASOSABS 0.0 08/09/2015 1621   Comprehensive Metabolic Panel:    Component Value Date/Time   NA 123* 08/11/2015 0532   K 4.0 08/11/2015 0532   CL 85* 08/11/2015 0532   CO2 27 08/11/2015 0532   BUN 65* 08/11/2015 0532   CREATININE 2.18* 08/11/2015 0532   GLUCOSE 90 08/11/2015 0532   CALCIUM 8.1* 08/11/2015 0532   AST 40 08/10/2015 1320   ALT 29 08/10/2015 1320   ALKPHOS 175* 08/10/2015 1320   BILITOT 2.5* 08/10/2015 1320   PROT 4.8* 08/10/2015 1320   ALBUMIN 2.6* 08/10/2015 1320    Physical Exam:  Vital Signs: BP 94/41 mmHg  Pulse 83  Temp(Src) 98.1 F (36.7 C) (Axillary)  Resp 17  Ht '5\' 11"'  (1.803 m)  Wt 79.7 kg (175 lb 11.3 oz)  BMI 24.52 kg/m2  SpO2 100% SpO2: SpO2: 100 % O2 Device: O2 Device: Not Delivered O2 Flow Rate: O2 Flow Rate (L/min): 2 L/min Intake/output summary:  Intake/Output Summary  (Last 24 hours) at 08/11/15 1551 Last data filed at 08/11/15 1344  Gross per 24 hour  Intake 1160.91 ml  Output    900 ml  Net 260.91 ml   LBM: Last BM Date: 08/11/15 Baseline Weight: Weight: 78.019 kg (172 lb) Most recent weight: Weight: 79.7 kg (175 lb 11.3 oz)  Exam Findings:   General: NAD, thin, frail HEENT: [place]/AT Resp: No labored breathing Abd: Soft, NT, ND Skin: Thin, bruising especially on right arm (from lab sticks) Neuro: Lethargic, arousable, oriented x 3           Palliative Performance Scale: 30 %                Additional Data Reviewed: Recent Labs     08/10/15  1337   08/11/15  0532  08/11/15  1434  WBC   --    --   3.1*  3.5*  HGB  6.5*   < >  7.2*  7.0*  PLT   --    --   50*  49*  NA  122*   --   123*   --   BUN  74*   --   65*   --   CREATININE  2.50*   --   2.18*   --    < > = values in this interval not displayed.     Time In: 1410 Time Out: 1540 Time Total: 18mn  Greater than 50%  of this time was spent counseling and coordinating care related to the above assessment and plan.   Signed by:  AVinie Sill NP Palliative Medicine Team Pager # [phone](M-F 8a-5p) Team Phone # [phone](Nights/Weekends)

## 2015-08-11 NOTE — Progress Notes (Signed)
Spoke with patient's wife at patient's bedside around 1815. She was concerned that he had not had a bowel movement since yesterday night. Normally has 3-4 with lactulose. May need to titrate up dose. Could also consider zinc supplementation, which has been shown to be effective against hepatic encephalopathy, thereby improving health-related quality of life Janae Bridgeman, Nouso K, Johnnette Barrios H. Clinical trial: oral zinc in hepatic encephalopathy. Aliment Pharmacol Ther. 2010;32(9):1080-90.)  Regarding GI workup, patient concerned about further bleeding risk. Pill endoscopy could be an option to identify source but would not be therapeutic.  Wife inquired about possibility of having a home health nurse check hemoglobins on a regular basis, as she is worried husband's hemoglobin will drop low like this again without them knowing.

## 2015-08-11 NOTE — Consult Note (Signed)
                                                                           [facility] Consult: 9:21 AM 08/11/2015  LOS: 1 day    Referring Provider: Dr Eniola  Primary Care Physician:  None His cardiologist in [place] was acting as his primary M.D. Dr. Lystash [phone] Primary Gastroenterologist:  Dr. Gessner new pt as of 08/09/15.     Reason for Consultation:  anemia   HPI: Sal Sasaki is a 80 y.o. male.  Newly arrived to [place] from [place] one week ago..  Hx MVR, right heart failure.  S/p pacemaker.  Hx A fib.  Remotely, perhaps 15 years ago or more, while living in [place], the patient underwent resection of part of his colon. His wife of 8 years is under the impression that this was done because of bleeding issues. 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  Hepatic encephalopathy on Xifaxan and Lactulose. 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  Patient last received blood transfusion around May 2016 at the time of acute epistaxis. No previous EGD or colonoscopy.   Last admit 07/24/15 was in [place] [facility] for acute encephalopathy and CKD noted.  Aldactone dose was cut in half. Admissions for same 06/30/15 and 07/08/15.  [facility] cardiology specialist had entertained tx with Bevacizumab for mgt of hepatic AVMs that were exacerbating heart failure.   Seen 2 days ago by Dr Gessner to establish care.  Described anorexia, early satiety Labs with many abnormalities (Na 122, Hgb 5, renal insufficiency, Ammonia 133, alk phos 176) so pt sent to hospital for admission. Patient however was not having significant dyspnea or decline in energy level. He was still ambulating around the house with his walker, per usual. Patient's stools are always dark because he is taking oral iron and the wife did not note any  maroon or bloody tinge to the stool.  He is having 3 or so bowel movements daily. In recent days he has not been acutely confused or excessively somnolent. However last night he did not sleep and today he is sleepier but arousable. He does have issues with incontinence of urine and bowel.  Appetite diminished. He has a tendency to form bruises easily but has no large hematomas and has not sustained any falls. CXR: CM with slight vascular congestion, slight BB atx.  S/p PRBC x with Hgb now 7.2.  MCV normal. Coags 16.6/1.33.  FOBT +.     Past Medical History  Diagnosis Date  . CHF (congestive heart failure)   . Pacemaker     single lead Medtronic pacemaker  . HHT (hereditary hemorrhagic telangiectasia)   . Anemia   . Pulmonary hypertension   . Osler-Weber-Rendu syndrome   . Renal insufficiency   . Liver failure   . Atrial fibrillation   . Hepatic AV fistula 08/09/2015  . 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 Anemia due to chronic blood loss 08/09/2015  . Right heart failure 08/09/2015  . Tricuspid regurgitation 08/09/2015  . Osler-Weber-Rendu syndrome     Past Surgical History  Procedure Laterality Date  . Pacemaker insertion    .   Colon surgery  1196    Prior to Admission medications   Medication Sig Start Date End Date Taking? Authorizing Provider  b complex-vitamin c-folic acid (NEPHRO-VITE) 0.8 MG TABS tablet Take 1 tablet by mouth daily.   Yes Historical Provider, MD  Cyanocobalamin 1000 MCG/ML KIT Inject 1 Syringe as directed every 30 (thirty) days.   Yes Historical Provider, MD  digoxin (LANOXIN) 0.125 MG tablet Take 0.125 mg by mouth 3 (three) times a week. Mon, Wed, Fri   Yes Historical Provider, MD  ferrous sulfate 325 (65 FE) MG EC tablet Take 325 mg by mouth 2 (two) times daily with a meal.    Yes Historical Provider, MD  lactulose (CHRONULAC) 10 GM/15ML solution Take 20 g by mouth 3 (three) times daily.   Yes Historical Provider, MD  Magnesium 250 MG TABS Take 1 tablet by mouth  daily.   Yes Historical Provider, MD  metolazone (ZAROXOLYN) 2.5 MG tablet Take 2.5 mg by mouth 2 (two) times a week. Mon and Fri   Yes Historical Provider, MD  potassium chloride (MICRO-K) 10 MEQ CR capsule Take 10 mEq by mouth 2 (two) times daily.   Yes Historical Provider, MD  promethazine (PHENERGAN) 25 MG tablet Take 6.25 mg by mouth daily as needed for nausea or vomiting.  07/27/15  Yes Historical Provider, MD  rifaximin (XIFAXAN) 550 MG TABS tablet Take 550 mg by mouth 2 (two) times daily.   Yes Historical Provider, MD  spironolactone (ALDACTONE) 25 MG tablet Take 25 mg by mouth daily.    Yes Historical Provider, MD  tamsulosin (FLOMAX) 0.4 MG CAPS capsule Take 0.4 mg by mouth.   Yes Historical Provider, MD  torsemide (DEMADEX) 100 MG tablet Take 100 mg by mouth daily as needed (fluid).    Yes Historical Provider, MD    Scheduled Meds: . digoxin  0.125 mg Oral Once per day on Mon Wed Fri  . feeding supplement (ENSURE ENLIVE)  237 mL Oral BID BM  . ferrous sulfate  325 mg Oral BID WC  . [START ON 08/12/2015] Influenza vac split quadrivalent PF  0.5 mL Intramuscular Tomorrow-1000  . lactulose  20 g Oral TID  . multivitamin  1 tablet Oral QHS  . pantoprazole  40 mg Oral BID  . rifaximin  550 mg Oral BID  . sodium chloride  3 mL Intravenous Q12H   Infusions:   PRN Meds: promethazine   Allergies as of 08/10/2015  . (No Known Allergies)    Family History  Problem Relation Age of Onset  . Other Mother     HHT  . Other Son     HHT  . Heart failure Father     Social History   Social History  . Marital Status: Married    Spouse Name: N/A  . Number of Children: 1  . Years of Education: N/A   Occupational History  . retired    Social History Main Topics  . Smoking status: Never Smoker   . Smokeless tobacco: Never Used  . Alcohol Use: No  . Drug Use: No  . Sexual Activity: Yes   Other Topics Concern  . Not on file   Social History Narrative   Married 1 son    Retired electrical engineer and consultant   One caffeinated beverage a day no alcohol   He was living in [place] at the [facility] [address] [place] and has located to [place] recently.   08/09/2015         REVIEW OF SYSTEMS: Constitutional:  Per HPI ENT:  No nose bleeds Pulm:  No SOB or cough CV:  No palpitations, no LE edema. No chest pain GU:  No hematuria, no frequency.  Some urinary incontinence. Currently a condom catheter is in place. GI:  No issues with dysphagia. No heartburn, no abdominal pain, no nausea Heme:  Per HPI   Transfusions:  Per HPI.  Last transfusion was around May 2016 at the time of acute epistaxis. Neuro:  No headaches, no peripheral tingling or numbness Derm:  No itching, no rash or sores.  Endocrine:  No sweats or chills.  No polyuria or dysuria Immunization:  Did not inquire. Travel:  None beyond local counties in last few months.    PHYSICAL EXAM: Vital signs in last 24 hours: Filed Vitals:   08/11/15 0751  BP: 100/48  Pulse: 80  Temp: 97.6 F (36.4 C)  Resp: 18   Wt Readings from Last 3 Encounters:  08/11/15 175 lb 11.3 oz (79.7 kg)  08/09/15 175 lb 2 oz (79.436 kg)  04/22/15 155 lb (70.308 kg)    General: Somnolent but arousable. Looks chronically ill and frail. Head:  No facial asymmetry or swelling. No signs of head trauma.  Eyes:  Telangiectasia visible in the right lower lid. No injected sclera. Conjunctiva somewhat pale. Ears:  Hearing intact though slightly diminished  Nose:  No discharge, no blood in the nares. Mouth:  Clear, moist MM. Good dentition. Tongue midline. Neck:  No JVD, no TMG. Lungs:  Clear bilaterally. No labored breathing or cough. Heart: RRR. No MRG. S1/S2 audible. Abdomen:  Soft, ND, NT. Bowel sounds active. No organomegaly..   Rectal: Dark brown/black stool tests 2-3+ FOBT+   Musc/Skeltl: Some wasting of the muscles in the thighs. No gross joint deformities, erythema or contractures. Extremities:   No CCE.  Neurologic:  Patient able to tell me his name, year and situation. He is somnolent but arousable. If not constantly stimulated he falls back to "sleep".  Positive asterixis as well as a resting upper extremity tremor. Skin:  Telangiectasia visible on the torso and limbs. Tattoos:  None Nodes:  No cervical adenopathy.   Psych:  Calm, without agitation.  Intake/Output from previous day: 08/30 0701 - 08/31 0700 In: 940.9 [I.V.:70; Blood:870.9] Out: 900 [Urine:900] Intake/Output this shift: Total I/O In: 120 [P.O.:120] Out: -   LAB RESULTS:  Recent Labs  08/09/15 1621 08/10/15 1320 08/10/15 1337 08/10/15 2236 08/11/15 0532  WBC 3.6* 3.9*  --   --  3.1*  HGB 5.5 Repeated and verified X2.* 5.7* 6.5* 6.4* 7.2*  HCT 17.3 Repeated and verified X2.* 18.3* 19.0* 20.3* 22.6*  PLT 56.0 Repeated and verified X2.* 64*  --   --  50*   BMET Lab Results  Component Value Date   NA 123* 08/11/2015   NA 122* 08/10/2015   NA 124* 08/10/2015   K 4.0 08/11/2015   K 4.5 08/10/2015   K 4.6 08/10/2015   CL 85* 08/11/2015   CL 84* 08/10/2015   CL 88* 08/10/2015   CO2 27 08/11/2015   CO2 27 08/10/2015   CO2 29 08/09/2015   GLUCOSE 90 08/11/2015   GLUCOSE 114* 08/10/2015   GLUCOSE 121* 08/10/2015   BUN 65* 08/11/2015   BUN 74* 08/10/2015   BUN 70* 08/10/2015   CREATININE 2.18* 08/11/2015   CREATININE 2.50* 08/10/2015   CREATININE 2.39* 08/10/2015   CALCIUM 8.1* 08/11/2015   CALCIUM 8.7* 08/10/2015   CALCIUM 8.4 08/09/2015     LFT  Recent Labs  08/09/15 1621 08/10/15 1320  PROT 4.9* 4.8*  ALBUMIN 2.8* 2.6*  AST 30 40  ALT 26 29  ALKPHOS 176* 175*  BILITOT 2.0* 2.5*   PT/INR Lab Results  Component Value Date   INR 1.33 08/10/2015   INR 1.2* 08/09/2015   INR 1.21 04/22/2015    RADIOLOGY STUDIES: No results found.  ENDOSCOPIC STUDIES: No  EGD Colonoscopy >5 yrs ago.  No polyps per wife's recall.     IMPRESSION:   *  Acute on chronic blood loss anemia in pt  with hereditary hemorrhagic telangiectasia with hepatic AVMs.  S/p PRBC x 3 with appropriate rise in hemoglobin.  Suspect telangiectasia of the GI tract are responsible for the anemia and FOBT + stool.  He also may have portal hypertension as well as varices.   *  Cirrhosis due to right heart failure.  *  FOBT +.  No previous EGD.  Last colonoscopy at least 5 years ago.    *  Hyponatremia  *  Hepatic encephalopathy.    *  CKD   PLAN:     *  Per Dr Justyna Timoney.  ? EGD?, ?colonoscopy? May be best to just begin with upper endoscopy to assess for telangiectasia as well as for varices and portal hypertension.  *  Repeat CBC in the morning.  Try to limit excessive blood draws.  *  Agree with your plans to involve palliative care for goals of care consult.  *  Patient does not need twice daily Protonix. He doesn't take any PPI at home. We will drop this to once daily for the time being   Sarah Gribbin  08/11/2015, 9:21 AM Pager: [phone]      Attending physician's note   I have taken a history, examined the patient and reviewed the chart. I agree with the Advanced Practitioner's note, impression and recommendations. Severe anemia and heme + stool in pt with HHT, hepatic AVMs, cirrhosis and right heart failure. Suspected GI tract AVMs with bleeding. Portal gastropathy, ulcer, etc. also possible. Transfuse to Hb >8.  Monitor CBC. Further evaluation with EGD and colonoscopy is appropriate, when Na improves and with platelets over 50k. EGD probably most important at this time and EGD was recommended however pt is not agreeable to proceed. He is under the impression that EGD/colonoscopy will likely cause nasal cavity or GI tract bleeding given his HHT. This is possible but unlikely. Will discuss with him again tomorrow and reassess.   Myrene Bougher T Skiler Tye, MD FACG [phone] pager Mon-Fri 8a-5p [phone] weekends, holidays and 5p-8a or per Amion 

## 2015-08-11 NOTE — Progress Notes (Signed)
Family Medicine Teaching Service Daily Progress Note Intern Pager: [phone]  Patient name: Hunter [name] Medical record number: [id number] Date of birth: March 24, 1935 Age: 80 y.o. Gender: male  Primary Care Provider: No PCP Per Patient Consultants: CHF team, GI, Palliative Code Status: DNR  Pt Overview and Major Events to Date:  - Received 2 units of blood 8/30 - Received 1 unit of blood 8/31  Assessment and Plan: Hunter [name] is a 80 y.o. male presenting with fatigue and severe anemia. 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 Suspect worsening of cirrhosis 2/2 CHF. Has received 3 units of blood. Had 2 dark, tarry stools overnight. Had Will continue to monitor vital signs and consider repeat transfusion or IVF administration. Consulting with CHF team and GI for treatment plan.   # Anemia: Hgb 7.2 after third unit of blood, up from 6.4 after 2 units of blood, up from hgb of 5.7 in the ED and 5.5 at GI outpt visit. Baseline 7-8. FOBT positive in the ED with 2 dark, tarry stools overnight. Soft blood pressures to 80s/40s.  - SDU with low BPs - CBCs BID - Monitor BPs.  - [facility] consulted. Appreciate recommendations.  - Continue home ferrous sulfate.   # Cirrhosis/liver failure: Believed to be secondary to hepatic AV fistula and CHF. Care everywhere review shows Hep A, B, C negative 07/21/2015. Hospitalized with hepatic encephalopathy 07/17/15 and 06/30/15. Ammonia elevated at 133. (Recent levels have ranged from 83-201). AST/ALT WNL. - Continue home lactulose TID, rifaximin BID.  - Consult Palliative Care.   # CHF: ECHO from 02/09/2015 showed LVEF 50-55% with grade 2 diastolic filling pattern, enlarged left and right ventricles, as well as severely dilated right and left atrium and tricuspid  and mitral regurgitation. EKG showed pacer spikes and prolonged QTc.  - Repeat ECHO - CHF team consulted, appreciate recommendations.  - Digoxin level 0.6. Consider increasing dose.   # Dark, Tarry Stools - Start protonix 40 mg BID.   # Hyponatremia: appears to be chronic from liver failure. Care everywhere review shows low sodium 120s during hospitalization earlier this month, July in mid/upper 120s. Asymptomatic.  - Chronic. May administer NS IVFs if BP remains low.  # Hypotension: SBP 80s/40s in ED. Improved to 100/48 this am.  - Holding home flomax, torsemide, metolazone, spironolactone.  - Consider IVFs if no response to blood transfusion.   # CKD: SCr 2.5 on admission. Improved to 2.18. SCr baseline per care everywhere review appears to be 1.4 back in March, but most recently 1.8 over past 2 months. - Repeat BMETs. Monitor Cr.   # Thrombocytopenia: Chronic. 50 < 64.  - Continue to monitor with CBCs  FEN/GI: Regular diet Prophylaxis: protonix, promethazine  Disposition: Admitted to [facility] for blood transfusion and care coordination.  Subjective:  Patient states he feels better after the blood transfusions. However, he is still very tired as he was not able to sleep last night. The batteries ran out on his hearing aids, so it is more difficult for him to hold a conversation today. (Hearing aids take size 13 battery. Wife may be able to get replacements from gift shop.) He continues to deny pain. He does not recall if he had dinner last night. He had two dark, tarry stools overnight.   Objective: Temp:  [96.2 F (35.7 C)-97.7 F (36.5 C)] 97.5  F (36.4 C) (08/31 0626) Pulse Rate:  [78-94] 81 (08/31 0626) Resp:  [10-20] 18 (08/31 0626) BP: (81-121)/(36-62) 94/43 mmHg (08/31 0626) SpO2:  [92 %-100 %] 100 % (08/31 0626) Weight:  [172 lb (78.019 kg)-176 lb 4.8 oz (79.969 kg)] 175 lb 11.3 oz (79.7 kg) (08/31 6063) Physical Exam: General: Thin man  laying in hospital bed in NAD.  HEENT: NCAT. No scleral icterus. MMM.  Cardiovascular: RRR. Systolic murmur appreciated over left and right lower sternal borders. 2+ pedal pulses.  Respiratory: CTAB.  Abdomen: +BS, soft, nontender, non-distended. MSK: + asterixis. 3+ pitting edema bilaterally to above the knee.  Skin: Pale nail beds. Jaundice. Diffuse telangiectasias.  Neuro: AOx3. Cooperative with exam. More alert this morning compared to initial exam.   Laboratory:  Recent Labs Lab 08/09/15 1621 08/10/15 1320 08/10/15 1337 08/10/15 2236  WBC 3.6* 3.9*  --   --   HGB 5.5 Repeated and verified X2.* 5.7* 6.5* 6.4*  HCT 17.3 Repeated and verified X2.* 18.3* 19.0* 20.3*  PLT 56.0 Repeated and verified X2.* 64*  --   --     Recent Labs Lab 08/09/15 1621 08/10/15 1320 08/10/15 1337  NA 122* 124* 122*  K 4.6 4.6 4.5  CL 87* 88* 84*  CO2 29 27  --   BUN 70* 70* 74*  CREATININE 2.19* 2.39* 2.50*  CALCIUM 8.4 8.7*  --   PROT 4.9* 4.8*  --   BILITOT 2.0* 2.5*  --   ALKPHOS 176* 175*  --   ALT 26 29  --   AST 30 40  --   GLUCOSE 107* 121* 114*    Hillary Corinda Gubler, MD 08/11/2015, 7:09 AM PGY-1, [facility] Intern pager: [phone], text pages welcome

## 2015-08-11 NOTE — Care Management Note (Signed)
Case Management Note  Patient Details  Name: Hunter [name] MRN: [id number] Date of Birth: 02-Apr-1935  Subjective/Objective:   Pt admitted for fatigue and severe anemia.  HGB 5.7 on admission. Pt received several units of PRBC's. GI MD recommended Pt is from home with wife. Pt is a DNR. Palliative Care to be consulted. Pt recently moved to [facility]. Pt is without PCP.   Action/Plan: CM will provide pt the [facility] Connect Number for PCP needs. CM will continue to monitor for disposition needs as well.    Expected Discharge Date:                  Expected Discharge Plan:  [place]  In-House Referral:     Discharge planning Services  CM Consult  Post Acute Care Choice:    Choice offered to:     DME Arranged:    DME Agency:     HH Arranged:    [facility] Agency:     Status of Service:  In process, will continue to follow  Medicare Important Message Given:    Date Medicare IM Given:    Medicare IM give by:    Date Additional Medicare IM Given:    Additional Medicare Important Message give by:     If discussed at [place] of Stay Meetings, dates discussed:    Additional Comments:  Bethena Roys, RN 08/11/2015, 10:25 AM

## 2015-08-11 NOTE — Clinical Documentation Improvement (Signed)
Family Medicine  #1 of 1: Can the diagnosis of CKD be further specified?   CKD Stage I - GFR greater than or equal to 90  CKD Stage II - GFR 60-89  CKD Stage III - GFR 30-59  CKD Stage IV - GFR 15-29  CKD Stage V - GFR < 15  ESRD (End Stage Renal Disease)  Other condition  Unable to clinically determine   Supporting Information: : (risk factors, signs and symptoms, diagnostics, treatment) Current documentation reflects baseline 1.4 in March but most recently 1.8 over past 2 months.  Current hospital labs below:  Component      BUN Creatinine  Latest Ref Rng      6 - 20 mg/dL 0.61 - 1.24 mg/dL  08/09/2015      70 (H) 2.19 (H)  08/10/2015     1:20 PM 70 (H) 2.39 (H)  08/10/2015     1:37 PM 74 (H) 2.50 (H)  08/11/2015      65 (H) 2.18 (H)   Component      EGFR (Non-African Amer.)  Latest Ref Rng      >60 mL/min  08/10/2015     1:20 PM 25 (L)  08/11/2015      27 (L)   #2 of 2: Acute on chronic renal dx currently documented in chart. Can this be further clarified?  -Acute kidney injury / acute renal failure on chronic kidney disease -Other -Unable to determine at present  Please exercise your independent, professional judgment when responding. A specific answer is not anticipated or expected.   Thank you, Mateo Flow, RN [place]

## 2015-08-11 NOTE — Discharge Summary (Signed)
[facility] Discharge Summary  Patient name: Hunter [name] Medical record number: [id number] Date of birth: [birth year] Age: 80 y.o. Gender: male Date of Admission: 08/10/2015  Date of Discharge: 9/3 Admitting Physician: Kinnie Feil, MD  Primary Care Provider: No PCP Per Patient Consultants: GI, CHF team, Palliative  Indication for Hospitalization: fatigue, severe anemia  Discharge Diagnoses/Problem List:  Hepatic Encephalopathy and GI Bleed  Disposition: home  Discharge Condition: improved   Brief Hospital Course:  Hunter [name] is a 80 y.o. male who presented with fatigue and severe anemia to 5.5 at GI outpatient visit on 08/09/15. 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 Suspected symptoms due to worsening of cirrhosis secondary to CHF and acute GI bleed.  Anemia: His stool tested positive on FOBT, and he also had dark, tarry stools during hospitalization. GI was consulted. He received 3 units of blood, and hemoglobin improved to 7.2. Patient had an episode of nose bleeding which required 2 more units of packed RBC. He received a total of 5 units of packed RBC. Repeat hemoglobin levels were stable. GI performed an EGD which noted bleeding angiodysplastic lesions in gastric fundus and cardia for which APC was applied resulting in complete hemostasis; additionally, APC was applied to lesion in gastric funding and gastric body. He was started on Protonix 78m BID. He was continued on home ferrous sulfate.   Cirrhosis/Liver Failure:  His cirrhosis was believed to be secondary to hepatic AV fistula and CHF. Care everywhere review shows Hep A, B, C negative in 07/2015. His ammonia is elevated at 133 (his recent levels have ranged from 83-201).  His AST and ALT were within normal limits. He was noted to have hepatic encephalopathy with lethargy and flapping tremor. He was noted to take Lactulose 20 TID. He received extra doses of Lactulose during hospital stay which improved his alertness. He was then transitioned to Lactulose 30 BID which was prescribed at discharge. .Marland Kitchenis home Rifaximin was continued. Palliative Care was consulted to discuss goals of care. Patient and wife were interested in first starting with home health PT to regain some strength. Palliative Care team also discussed the idea of utilizing hospice and outpatient palliative care for further discussion of goals of care after home health PT.   Hyponatremia: He was found to have hyponatremia at 124 on admission. This appeared to be chronic from liver failure. Care everywhere review shows low sodium during hospitalization in August and in July mid/upper 120s. Patient was asymptomatic. His levels were monitored through out hospital stay and remained between 120-129.    Hypokalemia: Patient was noted to have hypokalemia down to 2.9 during hospital stay, which was supplemented. Potassium was stable at 3.7 on day of discharge.    Hypotension:  On admission, patient's blood pressures were noted to be low in 80s/40s. His home flomax, torsemide, metolazone, and spironolactone were held initially. It was thought to be likely related to his bleeding. Pressures were closely monitored for improvement with blood transfusions. Blood pressures dis improve to systolics of upper 941Sto low 100s. IVF were given with caution due to CHF and bilateral pedal swelling which worsened with blood transfusion. Torsemide and Spironolactone were eventually restarted to avoid volume overload. IV lasix x1 given as well to help with fluid overload. Flomax was discontinued at discharge. Patient was resumed on home Metolazone, Spironolactone,  and Torsemide PRN at discharge.  CHF: On admission, EKG showed pacer  spikes and prolonged QTc. Patient's digoxin level was 0.6. He was continued on his home Digoxin. ECHO showed  EF 45-50%, Mod to severe MR, mod TR, severe dilation of RA & LA. As noted above, patient's fluid status was closely monitored and treated with caution due to lower blood pressures.   AKI on CKD:  Patient's creatinine was elevated on admission to 2.5. Baseline per care everywhere review appeared to be 1.4 in March nut most recently 1.8 over past 2 months. Etiology was likely prerenal as BUN/Cr >20. Creatine improved with blood transfusions, and was 1.80 on day of discharge.   Chronic Thrombocytopenia: Platelets were monitored. On admission was noted to be 68K, and remained stable between 49-50K during hospital stay.    Issues for Follow Up: - consider CBC to determine if hemoglobin is stable - follow up on blood pressure   Significant Procedures: 08/11/15 - ECHO:  EF 45-50%, Mod to severe MR, mod TR, severe dilation of RA & LA EGD: Bleeding angiodysplastic lesions in gastric fundus and cardia for which APC was applied resulting in complete hemostasis; additionally, APC was applied to lesion in gastric funding and gastric body. Significant Labs and Imaging:   Recent Labs Lab 08/13/15 0520 08/14/15 0300 08/15/15 0645  WBC 4.5 7.0 4.2  HGB 8.3* 8.7* 8.4*  HCT 25.8* 27.1* 26.7*  PLT 45* 50* 48*    Recent Labs Lab 08/12/15 0551 08/13/15 1215 08/14/15 0300 08/15/15 0645  NA 120* 126* 129* 125*  K 3.6 2.9* 3.0* 3.7  CL 84* 89* 91* 90*  CO2 _0 GLUCOSE 82 121* 118* 90  BUN 63* 61* 59* 53*  CREATININE 2.18* 2.17* 1.99* 1.80*  CALCIUM 8.1* 8.2* 8.2* 8.0*  ALKPHOS  --   --   --  183*  AST  --   --   --  37  ALT  --   --   --  26  ALBUMIN  --   --   --  2.5*    Results/Tests Pending at Time of Discharge: none  Discharge Medications:    Medication List    STOP taking these medications        tamsulosin 0.4 MG Caps capsule  Commonly known as:  FLOMAX       TAKE these medications        b complex-vitamin c-folic acid 0.8 MG Tabs tablet  Take 1 tablet by mouth daily.  Notes to Patient:  TAKE TONIGHT     Cyanocobalamin 1000 MCG/ML Kit  Inject 1 Syringe as directed every 30 (thirty) days.     digoxin 0.125 MG tablet  Commonly known as:  LANOXIN  Take 0.125 mg by mouth 3 (three) times a week. Mon, Wed, Fri     feeding supplement (ENSURE ENLIVE) Liqd  Take 237 mLs by mouth 2 (two) times daily between meals.  Notes to Patient:  DRINK ONE MORE TODAY     ferrous sulfate 325 (65 FE) MG EC tablet  Take 325 mg by mouth 2 (two) times daily with a meal.  Notes to Patient:  TAKE ONE DOSE AT DINNER     lactulose 10 GM/15ML solution  Commonly known as:  CHRONULAC  Take 45 mLs (30 g total) by mouth 2 (two) times daily.  Notes to Patient:  TAKE ONE DOSE TONIGHT     Magnesium 250 MG Tabs  Take 1 tablet by mouth daily.  metolazone 2.5 MG tablet  Commonly known as:  ZAROXOLYN  Take 2.5 mg by mouth 2 (two) times a week. Mon and Fri     pantoprazole 40 MG tablet  Commonly known as:  PROTONIX  Take 1 tablet (40 mg total) by mouth daily.     potassium chloride 10 MEQ CR capsule  Commonly known as:  MICRO-K  Take 10 mEq by mouth 2 (two) times daily.  Notes to Patient:  TAKE ONE DOSE TONIGHT     promethazine 25 MG tablet  Commonly known as:  PHENERGAN  Take 6.25 mg by mouth daily as needed for nausea or vomiting.     rifaximin 550 MG Tabs tablet  Commonly known as:  XIFAXAN  Take 550 mg by mouth 2 (two) times daily.  Notes to Patient:  TAKE ONE DOSE TONIGHT     spironolactone 25 MG tablet  Commonly known as:  ALDACTONE  Take 25 mg by mouth daily.     torsemide 100 MG tablet  Commonly known as:  DEMADEX  Take 100 mg by mouth daily as needed (fluid).        Discharge Instructions: Please refer to Patient Instructions section of EMR for full details.  Patient was counseled important signs and symptoms that should prompt return to  medical care, changes in medications, dietary instructions, activity restrictions, and follow up appointments.   Follow-Up Appointments: Follow-up Information    Follow up with [facility].   Why:  Registered Nurse and Physical Therapy   Contact information:   [address] [place] [address] [phone]       Smiley Houseman, MD 08/18/2015, 7:15 PM PGY-1, [facility]

## 2015-08-11 NOTE — Progress Notes (Signed)
Spoke with venipuncture, they will draw all morning labs at 0530 since RBC transfusion was ended at 0335.

## 2015-08-11 NOTE — Progress Notes (Addendum)
After 2 units of PRBCs hgb 6.4. Notified Family Med.   [phone] Spoke with Dr. Vickii Chafe about patient having two dark/tarry loose stools along with low Hgb. Orders for 1 unit to be transfused.

## 2015-08-12 DIAGNOSIS — K31811 Angiodysplasia of stomach and duodenum with bleeding: Secondary | ICD-10-CM | POA: Diagnosis not present

## 2015-08-12 DIAGNOSIS — I780 Hereditary hemorrhagic telangiectasia: Secondary | ICD-10-CM

## 2015-08-12 DIAGNOSIS — I5032 Chronic diastolic (congestive) heart failure: Secondary | ICD-10-CM | POA: Insufficient documentation

## 2015-08-12 DIAGNOSIS — D500 Iron deficiency anemia secondary to blood loss (chronic): Secondary | ICD-10-CM

## 2015-08-12 LAB — PROTIME-INR
INR: 1.27 (ref 0.00–1.49)
PROTHROMBIN TIME: 16.1 s — AB (ref 11.6–15.2)

## 2015-08-12 LAB — CBC WITH DIFFERENTIAL/PLATELET
BASOS PCT: 0 % (ref 0–1)
Basophils Absolute: 0 10*3/uL (ref 0.0–0.1)
EOS ABS: 0 10*3/uL (ref 0.0–0.7)
Eosinophils Relative: 1 % (ref 0–5)
HCT: 23.1 % — ABNORMAL LOW (ref 39.0–52.0)
HEMOGLOBIN: 7.4 g/dL — AB (ref 13.0–17.0)
Lymphocytes Relative: 11 % — ABNORMAL LOW (ref 12–46)
Lymphs Abs: 0.5 10*3/uL — ABNORMAL LOW (ref 0.7–4.0)
MCH: 27.6 pg (ref 26.0–34.0)
MCHC: 32 g/dL (ref 30.0–36.0)
MCV: 86.2 fL (ref 78.0–100.0)
MONOS PCT: 11 % (ref 3–12)
Monocytes Absolute: 0.5 10*3/uL (ref 0.1–1.0)
NEUTROS PCT: 77 % (ref 43–77)
Neutro Abs: 3.2 10*3/uL (ref 1.7–7.7)
Platelets: 49 10*3/uL — ABNORMAL LOW (ref 150–400)
RBC: 2.68 MIL/uL — AB (ref 4.22–5.81)
RDW: 18.1 % — ABNORMAL HIGH (ref 11.5–15.5)
WBC: 4.2 10*3/uL (ref 4.0–10.5)

## 2015-08-12 LAB — CBC
HCT: 21.2 % — ABNORMAL LOW (ref 39.0–52.0)
HEMATOCRIT: 26.2 % — AB (ref 39.0–52.0)
HEMOGLOBIN: 6.7 g/dL — AB (ref 13.0–17.0)
Hemoglobin: 8.5 g/dL — ABNORMAL LOW (ref 13.0–17.0)
MCH: 26.8 pg (ref 26.0–34.0)
MCH: 28 pg (ref 26.0–34.0)
MCHC: 31.6 g/dL (ref 30.0–36.0)
MCHC: 32.4 g/dL (ref 30.0–36.0)
MCV: 84.8 fL (ref 78.0–100.0)
MCV: 86.2 fL (ref 78.0–100.0)
PLATELETS: 48 10*3/uL — AB (ref 150–400)
Platelets: 53 10*3/uL — ABNORMAL LOW (ref 150–400)
RBC: 2.5 MIL/uL — AB (ref 4.22–5.81)
RBC: 3.04 MIL/uL — ABNORMAL LOW (ref 4.22–5.81)
RDW: 18.4 % — ABNORMAL HIGH (ref 11.5–15.5)
RDW: 17.8 % — AB (ref 11.5–15.5)
WBC: 4.4 10*3/uL (ref 4.0–10.5)
WBC: 4.6 10*3/uL (ref 4.0–10.5)

## 2015-08-12 LAB — BASIC METABOLIC PANEL
ANION GAP: 9 (ref 5–15)
BUN: 63 mg/dL — ABNORMAL HIGH (ref 6–20)
CHLORIDE: 84 mmol/L — AB (ref 101–111)
CO2: 27 mmol/L (ref 22–32)
Calcium: 8.1 mg/dL — ABNORMAL LOW (ref 8.9–10.3)
Creatinine, Ser: 2.18 mg/dL — ABNORMAL HIGH (ref 0.61–1.24)
GFR calc non Af Amer: 27 mL/min — ABNORMAL LOW (ref >60)
GFR, EST AFRICAN AMERICAN: 32 mL/min — AB (ref >60)
Glucose, Bld: 82 mg/dL (ref 65–99)
POTASSIUM: 3.6 mmol/L (ref 3.5–5.1)
SODIUM: 120 mmol/L — AB (ref 135–145)

## 2015-08-12 LAB — PREPARE RBC (CROSSMATCH)

## 2015-08-12 MED ORDER — LACTULOSE 10 GM/15ML PO SOLN
30.0000 g | Freq: Four times a day (QID) | ORAL | Status: DC
  Administered 2015-08-12 – 2015-08-14 (×7): 30 g via ORAL
  Filled 2015-08-12 (×7): qty 45

## 2015-08-12 MED ORDER — SODIUM CHLORIDE 0.9 % IV SOLN: Freq: Once | INTRAVENOUS | Status: DC

## 2015-08-12 MED ORDER — FUROSEMIDE 10 MG/ML IJ SOLN: 80.0000 mg | Freq: Once | INTRAMUSCULAR | Status: DC

## 2015-08-12 MED ORDER — FUROSEMIDE 10 MG/ML IJ SOLN
40.0000 mg | Freq: Once | INTRAMUSCULAR | Status: AC
  Administered 2015-08-12: 40 mg via INTRAVENOUS
  Filled 2015-08-12: qty 4

## 2015-08-12 MED ORDER — LACTULOSE 10 GM/15ML PO SOLN: 30.0000 g | Freq: Three times a day (TID) | ORAL | Status: DC

## 2015-08-12 MED ORDER — SODIUM CHLORIDE 0.9 % IV SOLN
Freq: Once | INTRAVENOUS | Status: AC
  Administered 2015-08-12: 07:00:00 via INTRAVENOUS

## 2015-08-12 NOTE — Progress Notes (Signed)
Advanced Heart Failure Rounding Note  PCP/Cardiologist: Moved from [place].  Cardiologist there was acting as PCP.  Dr. Claude Manges  Subjective:    Remains encephalopathic with lethargy and flapping tremor  Weight up 5 lbs from admission.  Started back on Torsemide 60 mg and spiro 25 mg yesterday.  Objective:   Weight Range: 180 lb 4.8 oz (81.784 kg) Body mass index is 25.16 kg/(m^2).   Vital Signs:   Temp:  [97.7 F (36.5 C)-99 F (37.2 C)] 98.9 F (37.2 C) (09/01 0823) Pulse Rate:  [74-84] 79 (09/01 0823) Resp:  [17-20] 18 (09/01 0530) BP: (86-100)/(39-48) 94/40 mmHg (09/01 0823) SpO2:  [95 %-100 %] 100 % (09/01 0823) Weight:  [180 lb 4.8 oz (81.784 kg)] 180 lb 4.8 oz (81.784 kg) (09/01 0943) Last BM Date: 08/12/15  Weight change: Filed Weights   08/10/15 1817 08/11/15 0626 08/12/15 0943  Weight: 176 lb 4.8 oz (79.969 kg) 175 lb 11.3 oz (79.7 kg) 180 lb 4.8 oz (81.784 kg)    Intake/Output:   Intake/Output Summary (Last 24 hours) at 08/12/15 1008 Last data filed at 08/12/15 0915  Gross per 24 hour  Intake    860 ml  Output    700 ml  Net    160 ml     Physical Exam: General: Elderly, Chronically ill, and fatigued. HEENT: Atraumatic, diffuse telengectasias. pale conjunctiva Neck: supple. JVP 12 . Carotids 2+ bilat; no bruits. No lymphadenopathy or thryomegaly noted. Cor: PMI nondisplaced. Regular rate & rhythm. II/VI SEM at LUSB and apex Lungs: Diminished bibasilarly Abdomen: soft, NT, mild distention. No HSM appreciated. No bruits or masses. +BS Extremities: no cyanosis, clubbing, rash. 1+ LE edema. Neuro: alert & orientedx3, cranial nerves grossly intact. moves all 4 extremities w/o difficulty. Affect flat. +asterixis   Telemetry:  V paced 80s with occasional PVCs  Labs: CBC  Recent Labs  08/09/15 1621  08/11/15 1434 08/12/15 0551  WBC 3.6*  < > 3.5* 4.4  NEUTROABS 2.7  --   --   --   HGB 5.5 Repeated and verified X2.*  < > 7.0* 6.7*  HCT 17.3  Repeated and verified X2.*  < > 21.7* 21.2*  MCV 84.9  < > 85.1 84.8  PLT 56.0 Repeated and verified X2.*  < > 49* 53*  < > = values in this interval not displayed. Basic Metabolic Panel  Recent Labs  08/11/15 0532 08/12/15 0551  NA 123* 120*  K 4.0 3.6  CL 85* 84*  CO2 27 27  GLUCOSE 90 82  BUN 65* 63*  CALCIUM 8.1* 8.1*   Liver Function Tests  Recent Labs  08/09/15 1621 08/10/15 1320  AST 30 40  ALT 26 29  ALKPHOS 176* 175*  BILITOT 2.0* 2.5*  PROT 4.9* 4.8*  ALBUMIN 2.8* 2.6*   No results for input(s): LIPASE, AMYLASE in the last 72 hours. Cardiac Enzymes No results for input(s): CKTOTAL, CKMB, CKMBINDEX, TROPONINI in the last 72 hours.  BNP: BNP (last 3 results) No results for input(s): BNP in the last 8760 hours.  ProBNP (last 3 results)  Recent Labs  08/09/15 1621  PROBNP 188.0*     D-Dimer No results for input(s): DDIMER in the last 72 hours. Hemoglobin A1C No results for input(s): HGBA1C in the last 72 hours. Fasting Lipid Panel No results for input(s): CHOL, HDL, LDLCALC, TRIG, CHOLHDL, LDLDIRECT in the last 72 hours. Thyroid Function Tests  Recent Labs  08/09/15 1621  TSH 5.49*    Other results:  Imaging/Studies:   No results found.  Latest Echo  Latest Cath   Medications:     Scheduled Medications: . digoxin  0.125 mg Oral Once per day on Mon Wed Fri  . feeding supplement (ENSURE ENLIVE)  237 mL Oral BID BM  . ferrous sulfate  325 mg Oral BID WC  . Influenza vac split quadrivalent PF  0.5 mL Intramuscular Tomorrow-1000  . lactulose  20 g Oral TID  . multivitamin  1 tablet Oral QHS  . pantoprazole  40 mg Oral Daily  . rifaximin  550 mg Oral BID  . sodium chloride  3 mL Intravenous Q12H  . spironolactone  25 mg Oral Daily  . torsemide  60 mg Oral Daily     Infusions:     PRN Medications:  promethazine   Assessment/Plan   1. Chronic diastolic CHF Echo 3/52/48 45-50%.  2. Pulmonary hypertension on  Echo 08/11/15 PA peak pressure 51 mm Hg 3. Liver failure / cirrhosis 4. CKD Stage III 5. Marked Anemia 6. Hereditary telangiectasia. 7. Hepatic encephalopathy  Continue torsemide and spironolactone.  Cr stable.  Main issues are profound anemia and worsening encephalopathy. Hgb trending down again. 3 units thus far, currently getting 4th.  Goal per GI is to transfuse to Hgb > 8. Ammonia 133 08/10/15 Spoke to family medicine service about increasing lactulose.   Length of Stay: 2   Shirley Friar PA-C 08/12/2015, 10:08 AM  Advanced Heart Failure Team Pager [phone] (M-F; 7a - 4p)  Please contact [facility] for night-coverage after hours (4p -7a ) and weekends on [email or web address]  Patient seen and examined with Oda Kilts, PA-C. We discussed all aspects of the encounter. I agree with the assessment and plan as stated above.   Will give one dose IV lasix to help with fluid overload. Discussed with FPTS regarding increasing lactulose due to severe encephalopathy.   Bensimhon, Daniel,MD 2:39 PM

## 2015-08-12 NOTE — Progress Notes (Signed)
        Daily Rounding Note  08/12/2015, 11:16 AM  LOS: 2 days   SUBJECTIVE:       Patient has had significant nosebleed with large outpouring of fresh blood and clots for greater than 10 minutes around 6 AM today. It has resolved.  Stool last night was small and dark.  No abdominal pain.  + anorexia but no nausea.   OBJECTIVE:         Vital signs in last 24 hours:    Temp:  [97.7 F (36.5 C)-99 F (37.2 C)] 98.2 F (36.8 C) (09/01 1030) Pulse Rate:  [74-84] 79 (09/01 1030) Resp:  [16-20] 16 (09/01 1030) BP: (86-100)/(39-48) 88/46 mmHg (09/01 1030) SpO2:  [95 %-100 %] 97 % (09/01 1030) Weight:  [180 lb 4.8 oz (81.784 kg)] 180 lb 4.8 oz (81.784 kg) (09/01 0943) Last BM Date: 08/12/15 Filed Weights   08/10/15 1817 08/11/15 0626 08/12/15 0943  Weight: 176 lb 4.8 oz (79.969 kg) 175 lb 11.3 oz (79.7 kg) 180 lb 4.8 oz (81.784 kg)   General: pleasant, comfortable   Heart: RRR Chest: clear bil.  No labored breathing Abdomen: soft, NT, ND.  Active BS  Extremities: no CCE Neuro/Psych:  Sleepy but arouses easily.  + asterixis.  Appropriated   Intake/Output from previous day: 08/31 0701 - 09/01 0700 In: 340 [P.O.:340] Out: 700 [Urine:700]  Intake/Output this shift: Total I/O In: 695 [P.O.:360; Blood:335] Out: -   Lab Results:  Recent Labs  08/11/15 0532 08/11/15 1434 08/12/15 0551  WBC 3.1* 3.5* 4.4  HGB 7.2* 7.0* 6.7*  HCT 22.6* 21.7* 21.2*  PLT 50* 49* 53*   BMET  Recent Labs  08/10/15 1320 08/10/15 1337 08/11/15 0532 08/12/15 0551  NA 124* 122* 123* 120*  K 4.6 4.5 4.0 3.6  CL 88* 84* 85* 84*  CO2 27  --  27 27  GLUCOSE 121* 114* 90 82  BUN 70* 74* 65* 63*  CREATININE 2.39* 2.50* 2.18* 2.18*  CALCIUM 8.7*  --  8.1* 8.1*   LFT  Recent Labs  08/09/15 1621 08/10/15 1320  PROT 4.9* 4.8*  ALBUMIN 2.8* 2.6*  AST 30 40  ALT 26 29  ALKPHOS 176* 175*  BILITOT 2.0* 2.5*   PT/INR  Recent Labs  08/10/15 1320 08/12/15 0605  LABPROT 16.6* 16.1*  INR 1.33 1.27     ASSESMENT:   * Acute on chronic blood loss anemia in pt with hereditary hemorrhagic telangiectasia with hepatic AVMs. S/p PRBC x 3 with appropriate rise in hemoglobin. Suspect telangiectasia of the GI tract and nasal cavity are responsible for the anemia and FOBT + stool. He also may have portal hypertension as well as varices.  Patient has refused endoscopic evaluation in past. S/P PRBC 4. 3 of these before the acute epistaxis, 1 given following episode of epistaxis. Hemoglobin has not significantly risen ( 6.4 to 7.2 to 6.7)   *  Acute epistaxis this morning. Likely contributing to lack of response to transfused PRBC.  Family medicine will call ENT if bleeding recurrs.  * Cirrhosis due to right heart failure.  * FOBT +. No previous EGD. Last colonoscopy at least 5 years ago.   *  Thrombocytopenia.  * Hyponatremia  * Hepatic encephalopathy.   * CKD   *  DNR.  Patient family met with palliative care yesterday.   Hunter [name]   *  EGD tomorrow.  Pt and wife agreeable to this.   *  Increase lactulose      Hunter Hunter [name]  08/12/2015, 11:16 AM Pager: [phone]     Attending physician's note   I have taken an interval history, reviewed the chart and examined the patient. I agree with the Advanced Practitioner's note, impression and recommendations. Significant epistaxis this morning. Recurrent epistaxis is likely contributing to his anemia and heme + stool. Proceed with EGD tomorrow. Pt and wife now consent to proceed. Will increase lactulose, titrate to about 3 BMs/day. Hb=6.7. Recommend transfusing to Hb > 8, defer to primary service.   Hunter Hunter [name]. Hunter Plan, MD Hunter Hunter [name] [phone] pager Mon-Fri 8a-5p [phone] weekends, holidays and 5p-8a or per [facility]

## 2015-08-12 NOTE — Progress Notes (Signed)
Hgb 6.7.Notified Dr. Brita Romp.

## 2015-08-12 NOTE — Progress Notes (Signed)
FPTS Interim Progress Note  S: Called by RN reporting ongoing nose bleed.  She states that nose started pouring blood and patient spit out a large blood clot.  Nose bleed ongoing for ~10 min.  Patient reports this is related to [place] and occurs somewhat regularly.  He states that it usually stops bleeding in ~10-20 min.  O: BP 86/40 mmHg  Pulse 80  Temp(Src) 98 F (36.7 C) (Tympanic)  Resp 18  Ht 5\' 11"  (1.803 m)  Wt 175 lb 11.3 oz (79.7 kg)  BMI 24.52 kg/m2  SpO2 95%  Gen: Sitting in NAD, intermittent coughing HEENT: NCAT, blood on chin and hands, gauze in place in R nare, MMM, no mucosal bleeding  A/P: Nose bleed: Related to HHT - Subsiding as I entered room - Continue pressure with gauze - CBC and INR pending - will transfuse pending Hgb - consider ENT c/s if bleeding does not stop  Virginia Crews, MD 08/12/2015, 6:11 AM PGY-2, [facility] Service pager [phone]

## 2015-08-12 NOTE — Progress Notes (Signed)
Family Medicine Teaching Service Daily Progress Note Intern Pager: [phone]  Patient name: Hunter [name] Medical record number: [id number] Date of birth: [birth year] Age: 80 y.o. Gender: male  Primary Care Provider: No PCP Per Patient Consultants: Palliative, GI, HF Code Status: DNR  Pt Overview and Major Events to Date:  - Received 1 unit RBC 9/1: patient had nose bleeding overnight for over 38mins - Torsemide 60mg  and spironolactone 25mg  started 8/30   - Received 2 units RBC 8/30 - Received 1 unit RBC 8/31   Assessment and Plan: Hunter [name] is a 80 y.o. male presenting with fatigue and severe anemia. 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 Suspect worsening of cirrhosis 2/2 CHF. Has received 4 units of blood. Noted to have SBP 80-90s overnight but asymptomatic.    # Anemia: Received 1 unit RBC this morning 2/2 Hgb 6.7 and episode of nosebleeding. Hgb of 5.7 in the ED and 5.5 at GI outpt visit. Baseline 7-8. FOBT positive in the ED. Soft blood pressures to 80s-90s/40s.  - SDU with low BPs - repeat CBC after transfusion today  - may need another unit of RBC as per GI goal is to transfuse to Hgb >8  - [facility] consulted. Appreciate recommendations.  - Continue home ferrous sulfate. - Monitor BPs.   # Cirrhosis/liver failure: Believed to be secondary to hepatic AV fistula and CHF. Care everywhere review shows Hep A, B, C negative 07/21/2015. Hospitalized with hepatic encephalopathy 07/17/15 and 06/30/15. Ammonia elevated at 133. (Recent levels have ranged from 83-201). AST/ALT WNL. Only 1 BM today (usualy 3-4 BM at home)  - increase Lactulose to 30mg  TID - Home  rifaximin BID.  - Palliative Care consulted: per note, patient is eligible for hospice but this was not discussed with  patient and wife during visit (8/31)  # CHF: ECHO from 02/09/2015 showed LVEF 50-55% with grade 2 diastolic filling pattern, enlarged left and right ventricles, as well as severely dilated right and left atrium and tricuspid and mitral regurgitation. EKG showed pacer spikes and prolonged QTc. Digoxin level 0.6. Patient noted to have 12 beat run of vtach overnight and was paced out while sleeping.   - CHF team following, appreciate recommendations.   # Dark, Tarry Stools: chronically taking iron tablets - Protonix 40 mg daily   # Hyponatremia: appears to be chronic from liver failure. Care everywhere review shows low sodium 120s during hospitalization earlier this month, July in mid/upper 120s. Asymptomatic.  - Chronic. May administer NS IVFs if BP remains low.  # Hypotension: SBP 80-90s/40-48  - Holding home flomax, metolazone.  - restarted torsemide, spironolactone 8/31 - Consider IVFs if no response to blood transfusion.   # CKD: SCr 2.5 on admission. Improved to 2.18. SCr baseline per care everywhere review appears to be 1.4 back in March, but most recently 1.8 over past 2 months. - Repeat BMETs. Monitor Cr.   # Thrombocytopenia: Chronic. 50 < 64. Stable today at 53.  - Continue to monitor with CBCs  FEN/GI: Regular diet Prophylaxis: protonix, promethazine  Disposition: Admitted to [facility] for blood transfusion and care coordination.  Subjective:  Patient states he is doing okay this morning. Had an eventful night with nose bleeding; states bleeding resolved after about 30 minutes. States that this usually happens due to [place]. Due to low hgb at 6.7, patient is  currently receiving one unit RBC. Per nursing patient also spit out a large blood clot. Denies chest pain or SOB. Notes he feels bloated and gassy. Has only had one BM yesterday. Wife states he usually has 3-4 at home. Also states he does not have an appetite; he wanted to try Boost today.    Objective: Temp:  [97.6 F (36.4 C)-98.5 F (36.9 C)] 98 F (36.7 C) (09/01 0530) Pulse Rate:  [74-83] 80 (09/01 0003) Resp:  [17-20] 18 (09/01 0530) BP: (86-100)/(40-48) 86/40 mmHg (09/01 0530) SpO2:  [95 %-100 %] 95 % (09/01 0530) Physical Exam: General: NAD.  HEENT: NCAT. No scleral icterus. MMM.  Cardiovascular: RRR. Systolic murmur appreciated over left and right lower sternal borders. 2+ pedal pulses.     Respiratory: mild crackles noted at bases bilaterally   Abdomen: +BS, soft, nontender but distended; possible palpation of stool in lower abdomen.  MSK: + asterixis. Dependant pitting edema noted: more prominent in calves bilaterally but mild pitting edema noted up to lower thighs.  Skin: Pale nail beds. Jaundice. Diffuse telangiectasias.  Neuro: Alert and answers questions appropriately. Cooperative with exam. Laboratory:  Recent Labs Lab 08/11/15 0532 08/11/15 1434 08/12/15 0551  WBC 3.1* 3.5* 4.4  HGB 7.2* 7.0* 6.7*  HCT 22.6* 21.7* 21.2*  PLT 50* 49* 53*    Recent Labs Lab 08/09/15 1621 08/10/15 1320 08/10/15 1337 08/11/15 0532 08/12/15 0551  NA 122* 124* 122* 123* 120*  K 4.6 4.6 4.5 4.0 3.6  CL 87* 88* 84* 85* 84*  CO2 29 27  --  27 27  BUN 70* 70* 74* 65* 63*  CREATININE 2.19* 2.39* 2.50* 2.18* 2.18*  CALCIUM 8.4 8.7*  --  8.1* 8.1*  PROT 4.9* 4.8*  --   --   --   BILITOT 2.0* 2.5*  --   --   --   ALKPHOS 176* 175*  --   --   --   ALT 26 29  --   --   --   AST 30 40  --   --   --   GLUCOSE 107* 121* 114* 90 82   Imaging/Diagnostic Tests: ECHO: EF: 45-50%; mod-severe regurgitation mitral and tricuspid valve; left and right atrium severely dilated. Mild calcification of aortic valve   Smiley Houseman, MD 08/12/2015, 7:03 AM PGY-1, [facility] Intern pager: [phone], text pages welcome

## 2015-08-12 NOTE — Progress Notes (Addendum)
Nose bleeding for over 10 mins, Dr. Brita Romp at bedside.

## 2015-08-12 NOTE — Progress Notes (Addendum)
Patient BP 88/44, denies feeling dizzy or any pain. Resting at this time. Notified Dr.Bacigalupo. Will continue to monitor.

## 2015-08-12 NOTE — Progress Notes (Signed)
Patient had 12 beat run of V-tach and paced out while sleeping. Will continue to monitor and notified Dr. Ola Spurr.

## 2015-08-12 NOTE — Progress Notes (Signed)
Patient educated and agreed to bed alarm use overnight for safety. However, bed appears "offline" in [facility] call-light system. Bed cables are connected securely and no error lights seen on side rails or data port.  Bed alarm set per safety plan. Primary RN and NT made aware that patient's bed alarm will not sound at desk or in hallways.  Will notify maintenance team for DIN repair in AM.

## 2015-08-13 ENCOUNTER — Inpatient Hospital Stay (HOSPITAL_COMMUNITY): Payer: Medicare Other | Admitting: Certified Registered"

## 2015-08-13 ENCOUNTER — Encounter (HOSPITAL_COMMUNITY): Payer: Self-pay | Admitting: Certified Registered"

## 2015-08-13 ENCOUNTER — Encounter (HOSPITAL_COMMUNITY): Admission: EM | Disposition: A | Discharge status: Home Health | Payer: Self-pay | Source: Home / Self Care

## 2015-08-13 DIAGNOSIS — K31811 Angiodysplasia of stomach and duodenum with bleeding: Principal | ICD-10-CM

## 2015-08-13 DIAGNOSIS — K7469 Other cirrhosis of liver: Secondary | ICD-10-CM

## 2015-08-13 DIAGNOSIS — I5032 Chronic diastolic (congestive) heart failure: Secondary | ICD-10-CM

## 2015-08-13 HISTORY — PX: ESOPHAGOGASTRODUODENOSCOPY: SHX5428

## 2015-08-13 LAB — BASIC METABOLIC PANEL
ANION GAP: 10 (ref 5–15)
BUN: 61 mg/dL — ABNORMAL HIGH (ref 6–20)
CALCIUM: 8.2 mg/dL — AB (ref 8.9–10.3)
CO2: 27 mmol/L (ref 22–32)
CREATININE: 2.17 mg/dL — AB (ref 0.61–1.24)
Chloride: 89 mmol/L — ABNORMAL LOW (ref 101–111)
GFR calc non Af Amer: 28 mL/min — ABNORMAL LOW (ref >60)
GFR, EST AFRICAN AMERICAN: 32 mL/min — AB (ref >60)
Glucose, Bld: 121 mg/dL — ABNORMAL HIGH (ref 65–99)
Potassium: 2.9 mmol/L — ABNORMAL LOW (ref 3.5–5.1)
SODIUM: 126 mmol/L — AB (ref 135–145)

## 2015-08-13 LAB — TYPE AND SCREEN
ABO/RH(D): A POS
Antibody Screen: NEGATIVE
UNIT DIVISION: 0
UNIT DIVISION: 0
UNIT DIVISION: 0
Unit division: 0
Unit division: 0

## 2015-08-13 LAB — CBC
HEMATOCRIT: 25.8 % — AB (ref 39.0–52.0)
Hemoglobin: 8.3 g/dL — ABNORMAL LOW (ref 13.0–17.0)
MCH: 27.9 pg (ref 26.0–34.0)
MCHC: 32.2 g/dL (ref 30.0–36.0)
MCV: 86.6 fL (ref 78.0–100.0)
PLATELETS: 45 10*3/uL — AB (ref 150–400)
RBC: 2.98 MIL/uL — AB (ref 4.22–5.81)
RDW: 17.9 % — AB (ref 11.5–15.5)
WBC: 4.5 10*3/uL (ref 4.0–10.5)

## 2015-08-13 LAB — AMMONIA: Ammonia: 87 umol/L — ABNORMAL HIGH (ref 9–35)

## 2015-08-13 SURGERY — EGD (ESOPHAGOGASTRODUODENOSCOPY)
Anesthesia: Monitor Anesthesia Care

## 2015-08-13 MED ORDER — TORSEMIDE 20 MG PO TABS
40.0000 mg | ORAL_TABLET | Freq: Once | ORAL | Status: AC
  Administered 2015-08-13: 40 mg via ORAL
  Filled 2015-08-13: qty 2

## 2015-08-13 MED ORDER — FENTANYL CITRATE (PF) 100 MCG/2ML IJ SOLN: 25.0000 ug | INTRAMUSCULAR | Status: DC | PRN

## 2015-08-13 MED ORDER — TORSEMIDE 20 MG PO TABS
100.0000 mg | ORAL_TABLET | Freq: Every day | ORAL | Status: DC
Start: 2015-08-14 — End: 2015-08-15
  Administered 2015-08-14 – 2015-08-15 (×2): 100 mg via ORAL
  Filled 2015-08-13 (×2): qty 5

## 2015-08-13 MED ORDER — SODIUM CHLORIDE 0.9 % IV SOLN
INTRAVENOUS | Status: DC
Start: 2015-08-13 — End: 2015-08-13
  Administered 2015-08-13: 1000 mL via INTRAVENOUS

## 2015-08-13 MED ORDER — PROPOFOL INFUSION 10 MG/ML OPTIME
INTRAVENOUS | Status: DC | PRN
  Administered 2015-08-13: 200 ug/kg/min via INTRAVENOUS

## 2015-08-13 MED ORDER — LACTATED RINGERS IV SOLN
INTRAVENOUS | Status: DC | PRN
  Administered 2015-08-13: 09:00:00 via INTRAVENOUS

## 2015-08-13 MED ORDER — LIDOCAINE HCL (CARDIAC) 20 MG/ML IV SOLN
INTRAVENOUS | Status: DC | PRN
  Administered 2015-08-13: 40 mg via INTRAVENOUS

## 2015-08-13 MED ORDER — BUTAMBEN-TETRACAINE-BENZOCAINE 2-2-14 % EX AERO
INHALATION_SPRAY | CUTANEOUS | Status: DC | PRN
Start: 2015-08-13 — End: 2015-08-13
  Administered 2015-08-13: 2 via TOPICAL

## 2015-08-13 NOTE — Progress Notes (Signed)
PT Cancellation Note  Patient Details Name: Hunter [name] MRN: [id number] DOB: [birth year]   Cancelled Treatment:    Reason Eval/Treat Not Completed: Medical issues which prohibited therapy.  Pt does not feel well after EGD (vomiting).  Asked for PT to wait until AM.  Relative in room reporting that MDs have spoken about possible d/c tomorrow, so we will need to Evaluate the pt tomorrow even if potassium is low.     Thanks,    Barbarann Ehlers. [place], [place], DPT [phone]   08/13/2015, 4:26 PM

## 2015-08-13 NOTE — Transfer of Care (Signed)
Immediate Anesthesia Transfer of Care Note  Patient: Hunter [name]  Procedure(s) Performed: Procedure(s): ESOPHAGOGASTRODUODENOSCOPY (EGD) (N/A)  Patient Location: Endoscopy Unit  Anesthesia Type:MAC  Level of Consciousness: sedated and responds to stimulation  Airway & Oxygen Therapy: Patient Spontanous Breathing and Patient connected to nasal cannula oxygen  Post-op Assessment: Report given to RN, Post -op Vital signs reviewed and stable and Patient moving all extremities  Post vital signs: Reviewed and stable  Last Vitals:  Filed Vitals:   08/13/15 0827  BP: 99/41  Pulse: 86  Temp: 36.3 C  Resp: 12    Complications: No apparent anesthesia complications

## 2015-08-13 NOTE — Anesthesia Postprocedure Evaluation (Signed)
  Anesthesia Post-op Note  Patient: Hunter [name]  Procedure(s) Performed: Procedure(s): ESOPHAGOGASTRODUODENOSCOPY (EGD) (N/A)  Patient Location: PACU  Anesthesia Type:MAC  Level of Consciousness: awake  Airway and Oxygen Therapy: Patient Spontanous Breathing  Post-op Pain: mild  Post-op Assessment: Post-op Vital signs reviewed              Post-op Vital Signs: Reviewed  Last Vitals:  Filed Vitals:   08/13/15 1540  BP: 102/53  Pulse: 82  Temp: 37.3 C  Resp: 18    Complications: No apparent anesthesia complications

## 2015-08-13 NOTE — Care Management Note (Signed)
Case Management Note  Patient Details  Name: Hunter [name] MRN: [id number] Date of Birth: May 09, 1935  Subjective/Objective:     Pt admitted for anemia. Pt given PRBC's. Pt is from home with wife. New to the area no PCP listed. Per wife Dr. Carlean Purl is willing to act as PCP.                Action/Plan: CM did provide pt with the [facility] Number for PCP needs. CM did speak with pt and wife in regards to change in [place].  Pt was currently active with [facility] and would like to change to [facility]. CM did make referral for services and SOC to begin within 24-48 hrs post d/c. CM did provide wife with list for personal care services as well. No DME needs. No further needs from CM at this time.    Expected Discharge Date:                  Expected Discharge Plan:  [place]  In-House Referral:  NA  Discharge planning Services  CM Consult  Post Acute Care Choice:  NA Choice offered to:  Patient, Spouse  DME Arranged:  N/A DME Agency:  NA  HH Arranged:  RN, PT [place] Agency:  [place]  Status of Service:  Completed, signed off  Medicare Important Message Given:  Yes-second notification given Date Medicare IM Given:    Medicare IM give by:    Date Additional Medicare IM Given:    Additional Medicare Important Message give by:     If discussed at [place] of Stay Meetings, dates discussed:    Additional Comments:  Bethena Roys, RN 08/13/2015, 3:03 PM

## 2015-08-13 NOTE — Progress Notes (Addendum)
Daily Progress Note   Patient Name: Hunter [name]       Date: 08/13/2015 DOB: February 15, 1935  Age: 80 y.o. MRN#: [id number] Attending Physician: Kinnie Feil, MD Primary Care Physician: No PCP Per Patient Admit Date: 08/10/2015  Reason for Consultation/Follow-up: Establishing goals of care  Subjective:     I met again today with Mr. & Mrs. Hoos. He is more sleepy today and there is a plan to increase lactulose (only had 1 BM where baseline is 3-4 BM per day). Mrs. Weckerly updates me that he is willing now to assess his anemia/bleeding with more invasive testing now that it is not improving after multiple units of blood. There main goal at this point is to try and get his bleeding under control. Emotional support given. I would like to offer them the option of hospice support at time given his terminal illness but Mr. Clopper is too sleepy today and I would like to discuss this with him as well. Continue interventions to stabilize anemia with goals to return to their new home at discharge. I will follow up tomorrow.    Length of Stay: 3 days  Current Medications: Scheduled Meds:  . sodium chloride   Intravenous Once  . [MAR Hold] digoxin  0.125 mg Oral Once per day on Mon Wed Fri  . [MAR Hold] feeding supplement (ENSURE ENLIVE)  237 mL Oral BID BM  . [MAR Hold] ferrous sulfate  325 mg Oral BID WC  . [MAR Hold] lactulose  30 g Oral QID  . [MAR Hold] multivitamin  1 tablet Oral QHS  . [MAR Hold] pantoprazole  40 mg Oral Daily  . [MAR Hold] rifaximin  550 mg Oral BID  . [MAR Hold] sodium chloride  3 mL Intravenous Q12H  . [MAR Hold] spironolactone  25 mg Oral Daily  . [MAR Hold] torsemide  60 mg Oral Daily    Continuous Infusions:    PRN Meds: [MAR Hold] promethazine  Palliative Performance Scale: 30%     Vital Signs: BP 89/51 mmHg  Pulse 81  Temp(Src) 97.9 F (36.6 C) (Oral)  Resp 16  Ht _0  (1.803 m)  Wt 81.874 kg (180 lb 8 oz)  BMI 25.19 kg/m2  SpO2 96% SpO2:  SpO2: 96 % O2 Device: O2 Device: Not Delivered O2 Flow Rate: O2 Flow Rate (L/min): 2 L/min  Intake/output summary:  Intake/Output Summary (Last 24 hours) at 08/13/15 0816 Last data filed at 08/13/15 0500  Gross per 24 hour  Intake   1135 ml  Output   1860 ml  Net   -725 ml   LBM: Last BM Date: 08/12/15 Baseline Weight: Weight: 78.019 kg (172 lb) Most recent weight: Weight: 81.874 kg (180 lb 8 oz)  Physical Exam: General: NAD, thin, frail HEENT: [place]/AT Resp: No labored breathing Abd: Soft, NT, ND Skin: Thin, bruising especially on right arm (from lab sticks) Neuro: More lethargic today    Additional Data Reviewed: Recent Labs     08/11/15  0532   08/12/15  0551   08/12/15  2203  08/13/15  0520  WBC  3.1*   < >  4.4   < >  4.6  4.5  HGB  7.2*   < >  6.7*   < >  8.5*  8.3*  PLT  50*   < >  53*   < >  48*  45*  NA  123*   --   120*   --    --    --  BUN  65*   --   63*   --    --    --   CREATININE  2.18*   --   2.18*   --    --    --    < > = values in this interval not displayed.     Problem List:  Patient Active Problem List   Diagnosis Date Noted  . Iron deficiency anemia due to chronic blood loss   . Occult blood in stools   . Chronic diastolic heart failure   . Palliative care encounter 08/11/2015  . Anemia 08/10/2015  . Severe anemia 08/10/2015  . Acute blood loss anemia   . Hyponatremia   . Arterial hypotension   . Liver failure   . Cirrhosis   . Congestive heart disease   . Hepatic AV fistula 08/09/2015  . Mitral regurgitation 08/09/2015  . HHT (hereditary hemorrhagic telangiectasia) 08/09/2015  . Anemia due to chronic blood loss 08/09/2015  . Right heart failure 08/09/2015  . 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 Tricuspid regurgitation 08/09/2015  . Chronic kidney disease 08/09/2015     Palliative Care Assessment & Plan    Code Status:  DNR  Goals of Care:  Goal now is to control bleeding and optimize to return home. QOL suffers.   3.  Symptom Management:  Fatigue: Unfortunately this may not be very very reversible. Allow adequate time for rest. Maximize lactulose and treat anemia.   Decreased appetite: Continue small frequent meals as tolerated. Continue Ensure/supplements.   Denies pain.  4. Prognosis: Very poor - likely less than 6 months (did not discuss with them)  5. Discharge Planning: Home - still would like to offer hospice to them   Thank you for allowing the Palliative Medicine Team to assist in the care of this patient.   Time In: 1015 Time Out: 1040 Total Time 102mn Prolonged Time Billed  no     Greater than 50%  of this time was spent counseling and coordinating care related to the above assessment and plan.  AVinie Sill NP Palliative Medicine Team Pager # [phone](M-F 8a-5p) Team Phone # [phone](Nights/Weekends)  08/13/2015, 8:16 AM

## 2015-08-13 NOTE — Progress Notes (Signed)
Daily Progress Note   Patient Name: Hunter [name]       Date: 08/13/2015 DOB: 06-Dec-1935  Age: 80 y.o. MRN#: [id number] Attending Physician: Kinnie Feil, MD Primary Care Physician: No PCP Per Patient Admit Date: 08/10/2015  Reason for Consultation/Follow-up: Establishing goals of care  Subjective:     Hunter [name] has returned from EGD and wife and sister are at bedside. They are very hopeful that his source of anemia has been found and corrected. He has also had MANY BM overnight and is more alert today. Wife begins to speak of home and is anxious to begin the process for arranging home care. We did discuss some of the signs of anemia and blood loss for them to monitor at home. I did mention to them that it could be beneficial to consider hospice to help support them at home (I agree with them to have home health at discharge as he can have rehab to optimize independence) after home health and therapy completed. They were receptive and sister speaks to how helpful hospice could be, wife was receptive but not wanting to have hospice at this point but willing to consider in the future, Hunter [name] did not comment on the subject. I reassured them that this could be a good option at any point of time to help them at home. They are happy to be thinking of getting to return to their new home soon. They expressed appreciation of the support and care they have received here.   They would like to have help to set up scheduled labs taken at home by home health (or anyone that can provide this service) to monitor anemia.    Length of Stay: 3 days  Current Medications: Scheduled Meds:  . sodium chloride   Intravenous Once  . digoxin  0.125 mg Oral Once per day on Mon Wed Fri  . feeding supplement (ENSURE ENLIVE)  237 mL Oral BID BM  . ferrous sulfate  325 mg Oral BID WC  . lactulose  30 g Oral QID  . multivitamin  1 tablet Oral QHS  . pantoprazole  40 mg Oral Daily  . rifaximin  550 mg Oral BID    . sodium chloride  3 mL Intravenous Q12H  . spironolactone  25 mg Oral Daily  . [START ON 08/14/2015] torsemide  100 mg Oral Daily  . torsemide  40 mg Oral Once    Continuous Infusions:    PRN Meds: fentaNYL (SUBLIMAZE) injection, promethazine  Palliative Performance Scale: 30%     Vital Signs: BP 90/49 mmHg  Pulse 81  Temp(Src) 97.9 F (36.6 C) (Oral)  Resp 18  Ht 5\' 11"  (1.803 m)  Wt 81.874 kg (180 lb 8 oz)  BMI 25.19 kg/m2  SpO2 100% SpO2: SpO2: 100 % O2 Device: O2 Device: Not Delivered O2 Flow Rate: O2 Flow Rate (L/min): 2 L/min  Intake/output summary:  Intake/Output Summary (Last 24 hours) at 08/13/15 1138 Last data filed at 08/13/15 1100  Gross per 24 hour  Intake    693 ml  Output   1465 ml  Net   -772 ml   LBM: Last BM Date: 08/12/15 Baseline Weight: Weight: 78.019 kg (172 lb) Most recent weight: Weight: 81.874 kg (180 lb 8 oz)  Physical Exam: General: NAD, thin, frail HEENT: [place]/AT Resp: No labored breathing Abd: Soft, NT, ND Skin: Thin, bruising especially on right arm (from lab sticks) Neuro: More alert today    Additional Data Reviewed: Recent  Labs     08/11/15  0532   08/12/15  0551   08/12/15  2203  08/13/15  0520  WBC  3.1*   < >  4.4   < >  4.6  4.5  HGB  7.2*   < >  6.7*   < >  8.5*  8.3*  PLT  50*   < >  53*   < >  48*  45*  NA  123*   --   120*   --    --    --   BUN  65*   --   63*   --    --    --   CREATININE  2.18*   --   2.18*   --    --    --    < > = values in this interval not displayed.     Problem List:  Patient Active Problem List   Diagnosis Date Noted  . Iron deficiency anemia due to chronic blood loss   . Occult blood in stools   . Chronic diastolic heart failure   . Palliative care encounter 08/11/2015  . Anemia 08/10/2015  . Severe anemia 08/10/2015  . Acute blood loss anemia   . Hyponatremia   . Arterial hypotension   . Liver failure   . Cirrhosis   . Congestive heart disease   . Hepatic AV fistula  08/09/2015  . Mitral regurgitation 08/09/2015  . HHT (hereditary hemorrhagic telangiectasia) 08/09/2015  . Anemia due to chronic blood loss 08/09/2015  . Right heart failure 08/09/2015  . 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 Tricuspid regurgitation 08/09/2015  . Chronic kidney disease 08/09/2015     Palliative Care Assessment & Plan    Code Status:  DNR  Goals of Care:  Goal now is to control bleeding and optimize to return home. QOL suffers.  3. Symptom Management:  Fatigue: Unfortunately this may not be very very reversible. Allow adequate time for rest. Maximize lactulose and treat anemia.  Decreased appetite: Continue small frequent meals as tolerated. Continue Ensure/supplements.   Denies pain.  4. Prognosis: Very poor - likely less than 6 months (did not discuss with them)  5. Discharge Planning: Home with home health   Thank you for allowing the Palliative Medicine Team to assist in the care of this patient.   Time In: 1110 Time Out: 1140 Total Time 9min Prolonged Time Billed  no     Greater than 50%  of this time was spent counseling and coordinating care related to the above assessment and plan.     Vinie Sill, NP Palliative Medicine Team Pager # [phone] (M-F 8a-5p) Team Phone # [phone] (Nights/Weekends)  08/13/2015, 11:38 AM

## 2015-08-13 NOTE — Progress Notes (Signed)
Advanced Heart Failure Rounding Note  PCP/Cardiologist: Moved from [place].  Cardiologist there was acting as PCP.  Dr. Claude Manges  Subjective:    More alert today. Results of EGD noted. Denies dyspnea.     Objective:   Weight Range: 180 lb 8 oz (81.874 kg) Body mass index is 25.19 kg/(m^2).   Vital Signs:   Temp:  [97.3 F (36.3 C)-98.9 F (37.2 C)] 98.1 F (36.7 C) (09/02 1300) Pulse Rate:  [77-86] 77 (09/02 1300) Resp:  [12-18] 16 (09/02 1300) BP: (80-99)/(30-54) 96/51 mmHg (09/02 1300) SpO2:  [96 %-100 %] 96 % (09/02 1300) Weight:  [180 lb 8 oz (81.874 kg)] 180 lb 8 oz (81.874 kg) (09/02 0430) Last BM Date: 08/12/15  Weight change: Filed Weights   08/11/15 0626 08/12/15 0943 08/13/15 0430  Weight: 175 lb 11.3 oz (79.7 kg) 180 lb 4.8 oz (81.784 kg) 180 lb 8 oz (81.874 kg)    Intake/Output:   Intake/Output Summary (Last 24 hours) at 08/13/15 1409 Last data filed at 08/13/15 1318  Gross per 24 hour  Intake    453 ml  Output   2041 ml  Net  -1588 ml     Physical Exam: General: Elderly, Chronically ill, and fatigued. HEENT:  diffuse telengectasias.  Neck: supple. JVP 9-10 . Carotids 2+ bilat; no bruits. No lymphadenopathy or thryomegaly noted. Cor: PMI nondisplaced. Regular rate & rhythm. II/VI SEM at LUSB and apex Lungs: Diminished bibasilarly Abdomen: soft, NT, mild distention. No HSM appreciated. No bruits or masses. +BS Extremities: no cyanosis, clubbing, rash. Tr-1+ LE edema. Neuro: alert & orientedx3, cranial nerves grossly intact. moves all 4 extremities w/o difficulty. Affect flat.    Telemetry:  V paced 80s with occasional PVCs  Labs: CBC  Recent Labs  08/12/15 1325 08/12/15 2203 08/13/15 0520  WBC 4.2 4.6 4.5  NEUTROABS 3.2  --   --   HGB 7.4* 8.5* 8.3*  HCT 23.1* 26.2* 25.8*  MCV 86.2 86.2 86.6  PLT 49* 48* 45*   Basic Metabolic Panel  Recent Labs  08/12/15 0551 08/13/15 1215  NA 120* 126*  K 3.6 2.9*  CL 84* 89*  CO2 27 27   GLUCOSE 82 121*  BUN 63* 61*  CALCIUM 8.1* 8.2*   Liver Function Tests No results for input(s): AST, ALT, ALKPHOS, BILITOT, PROT, ALBUMIN in the last 72 hours. No results for input(s): LIPASE, AMYLASE in the last 72 hours. Cardiac Enzymes No results for input(s): CKTOTAL, CKMB, CKMBINDEX, TROPONINI in the last 72 hours.  BNP: BNP (last 3 results) No results for input(s): BNP in the last 8760 hours.  ProBNP (last 3 results)  Recent Labs  08/09/15 1621  PROBNP 188.0*     D-Dimer No results for input(s): DDIMER in the last 72 hours. Hemoglobin A1C No results for input(s): HGBA1C in the last 72 hours. Fasting Lipid Panel No results for input(s): CHOL, HDL, LDLCALC, TRIG, CHOLHDL, LDLDIRECT in the last 72 hours. Thyroid Function Tests No results for input(s): TSH, T4TOTAL, T3FREE, THYROIDAB in the last 72 hours.  Invalid input(s): FREET3  Other results:     Imaging/Studies:  No results found.  Latest Echo  Latest Cath   Medications:     Scheduled Medications: . sodium chloride   Intravenous Once  . digoxin  0.125 mg Oral Once per day on Mon Wed Fri  . feeding supplement (ENSURE ENLIVE)  237 mL Oral BID BM  . ferrous sulfate  325 mg Oral BID WC  . lactulose  30 g Oral QID  . multivitamin  1 tablet Oral QHS  . pantoprazole  40 mg Oral Daily  . rifaximin  550 mg Oral BID  . sodium chloride  3 mL Intravenous Q12H  . spironolactone  25 mg Oral Daily  . [START ON 08/14/2015] torsemide  100 mg Oral Daily  . torsemide  40 mg Oral Once    Infusions:    PRN Medications: fentaNYL (SUBLIMAZE) injection, promethazine   Assessment/Plan   1. Chronic diastolic CHF Echo 01/10/42 45-50%.  2. Pulmonary hypertension on Echo 08/11/15 PA peak pressure 51 mm Hg 3. Liver failure / cirrhosis 4. CKD Stage III 5. Marked Anemia 6. Hereditary telangiectasia. 7. Hepatic encephalopathy  More alert today with treatment of hepatic encephalopathy.   Volume status looks  ok. Will resume home diuretic dosing.   I will follow at a distance. We will arrange outpatient f/u in HF Clinic. Please call if questions. [place] for d/c from our standpoint.  Length of Stay: 3   Trini Christiansen MD 08/13/2015, 2:09 PM  Advanced Heart Failure Team Pager [phone] (M-F; 7a - 4p)  Please contact [facility] for night-coverage after hours (4p -7a ) and weekends on [email or web address]

## 2015-08-13 NOTE — Interval H&P Note (Signed)
History and Physical Interval Note:  08/13/2015 8:59 AM  Hunter [name]  has presented today for surgery, with the diagnosis of FOBT+ dark stools, anemia.  The various methods of treatment have been discussed with the patient and family. After consideration of risks, benefits and other options for treatment, the patient has consented to  Procedure(s): ESOPHAGOGASTRODUODENOSCOPY (EGD) (N/A) as a surgical intervention .  The patient's history has been reviewed, patient examined, no change in status, stable for surgery.  I have reviewed the patient's chart and labs.  Questions were answered to the patient's satisfaction.     Pricilla Riffle. [place] Plan

## 2015-08-13 NOTE — Anesthesia Preprocedure Evaluation (Signed)
Anesthesia Evaluation  Patient identified by MRN, date of birth, ID band Patient awake    Reviewed: Allergy & Precautions, NPO status , Patient's Chart, lab work & pertinent test results  Airway Mallampati: II  TM Distance: >3 FB Neck ROM: Full    Dental   Pulmonary neg pulmonary ROS,  breath sounds clear to auscultation        Cardiovascular +CHF + pacemaker Rhythm:Regular Rate:Normal     Neuro/Psych    GI/Hepatic negative GI ROS, (+) Cirrhosis -      ,   Endo/Other    Renal/GU Renal disease     Musculoskeletal   Abdominal   Peds  Hematology   Anesthesia Other Findings   Reproductive/Obstetrics                             Anesthesia Physical Anesthesia Plan  ASA: III  Anesthesia Plan: MAC   Post-op Pain Management:    Induction: Intravenous  Airway Management Planned: Nasal Cannula  Additional Equipment:   Intra-op Plan:   Post-operative Plan:   Informed Consent: I have reviewed the patients History and Physical, chart, labs and discussed the procedure including the risks, benefits and alternatives for the proposed anesthesia with the patient or authorized representative who has indicated his/her understanding and acceptance.   Dental advisory given  Plan Discussed with: CRNA and Anesthesiologist  Anesthesia Plan Comments:         Anesthesia Quick Evaluation

## 2015-08-13 NOTE — H&P (View-Only) (Signed)
[facility] Consult: 9:21 AM 08/11/2015  LOS: 1 day    Referring Provider: Dr Gwendlyn Deutscher  Primary Care Physician:  None His cardiologist in [facility] was acting as his primary M.D. Dr. Lennie Odor [phone] Primary Gastroenterologist:  Dr. Carlean Purl new pt as of 08/09/15.     Reason for Consultation:  anemia   HPI: Hunter [name] is a 80 y.o. male.  Newly arrived to [place] from [place] one week ago.[name]  Hx MVR, right heart failure.  S/p pacemaker.  Hx A fib.  Remotely, perhaps 15 years ago or more, while living in [facility], the patient underwent resection of part of his colon. His wife of 8 years is under the impression that this was done because of bleeding issues. 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  Hepatic encephalopathy on Xifaxan and Lactulose. 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  Patient last received blood transfusion around May 2016 at the time of acute epistaxis. No previous EGD or colonoscopy.   Last admit 07/24/15 was in [place] for acute encephalopathy and CKD noted.  Aldactone dose was cut in half. Admissions for same 06/30/15 and 07/08/15.  [facility] cardiology specialist had entertained tx with Bevacizumab for mgt of hepatic AVMs that were exacerbating heart failure.   Seen 2 days ago by Dr Carlean Purl to establish care.  Described anorexia, early satiety Labs with many abnormalities (Na 122, Hgb 5, renal insufficiency, Ammonia 133, alk phos 176) so pt sent to hospital for admission. Patient however was not having significant dyspnea or decline in energy level. He was still ambulating around the house with his walker, per usual. Patient's stools are always dark because he is taking oral iron and the wife did not note any  maroon or bloody tinge to the stool.  He is having 3 or so bowel movements daily. In recent days he has not been acutely confused or excessively somnolent. However last night he did not sleep and today he is sleepier but arousable. He does have issues with incontinence of urine and bowel.  Appetite diminished. He has a tendency to form bruises easily but has no large hematomas and has not sustained any falls. CXR: CM with slight vascular congestion, slight BB atx.  S/p PRBC x with Hgb now 7.2.  MCV normal. Coags 16.6/1.33.  FOBT +.     Past Medical History  Diagnosis Date  . CHF (congestive heart failure)   . Pacemaker     single lead Medtronic pacemaker  . HHT (hereditary hemorrhagic telangiectasia)   . Anemia   . Pulmonary hypertension   . Osler-Weber-Rendu syndrome   . Renal insufficiency   . Liver failure   . Atrial fibrillation   . Hepatic AV fistula 08/09/2015  . 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 Anemia due to chronic blood loss 08/09/2015  . Right heart failure 08/09/2015  . Tricuspid regurgitation 08/09/2015  . Osler-Weber-Rendu syndrome     Past Surgical History  Procedure Laterality Date  . Pacemaker insertion    .  Colon surgery  1196    Prior to Admission medications   Medication Sig Start Date End Date Taking? Authorizing Provider  b complex-vitamin c-folic acid (NEPHRO-VITE) 0.8 MG TABS tablet Take 1 tablet by mouth daily.   Yes Historical Provider, MD  Cyanocobalamin 1000 MCG/ML KIT Inject 1 Syringe as directed every 30 (thirty) days.   Yes Historical Provider, MD  digoxin (LANOXIN) 0.125 MG tablet Take 0.125 mg by mouth 3 (three) times a week. Mon, Wed, Fri   Yes Historical Provider, MD  ferrous sulfate 325 (65 FE) MG EC tablet Take 325 mg by mouth 2 (two) times daily with a meal.    Yes Historical Provider, MD  lactulose (CHRONULAC) 10 GM/15ML solution Take 20 g by mouth 3 (three) times daily.   Yes Historical Provider, MD  Magnesium 250 MG TABS Take 1 tablet by mouth  daily.   Yes Historical Provider, MD  metolazone (ZAROXOLYN) 2.5 MG tablet Take 2.5 mg by mouth 2 (two) times a week. Mon and Fri   Yes Historical Provider, MD  potassium chloride (MICRO-K) 10 MEQ CR capsule Take 10 mEq by mouth 2 (two) times daily.   Yes Historical Provider, MD  promethazine (PHENERGAN) 25 MG tablet Take 6.25 mg by mouth daily as needed for nausea or vomiting.  07/27/15  Yes Historical Provider, MD  rifaximin (XIFAXAN) 550 MG TABS tablet Take 550 mg by mouth 2 (two) times daily.   Yes Historical Provider, MD  spironolactone (ALDACTONE) 25 MG tablet Take 25 mg by mouth daily.    Yes Historical Provider, MD  tamsulosin (FLOMAX) 0.4 MG CAPS capsule Take 0.4 mg by mouth.   Yes Historical Provider, MD  torsemide (DEMADEX) 100 MG tablet Take 100 mg by mouth daily as needed (fluid).    Yes Historical Provider, MD    Scheduled Meds: . digoxin  0.125 mg Oral Once per day on Mon Wed Fri  . feeding supplement (ENSURE ENLIVE)  237 mL Oral BID BM  . ferrous sulfate  325 mg Oral BID WC  . [START ON 08/12/2015] Influenza vac split quadrivalent PF  0.5 mL Intramuscular Tomorrow-1000  . lactulose  20 g Oral TID  . multivitamin  1 tablet Oral QHS  . pantoprazole  40 mg Oral BID  . rifaximin  550 mg Oral BID  . sodium chloride  3 mL Intravenous Q12H   Infusions:   PRN Meds: promethazine   Allergies as of 08/10/2015  . (No Known Allergies)    Family History  Problem Relation Age of Onset  . Other Mother     HHT  . Other Son     HHT  . Heart failure Father     Social History   Social History  . Marital Status: Married    Spouse Name: N/A  . Number of Children: 1  . Years of Education: N/A   Occupational History  . retired    Social History Main Topics  . Smoking status: Never Smoker   . Smokeless tobacco: Never Used  . Alcohol Use: No  . Drug Use: No  . Sexual Activity: Yes   Other Topics Concern  . Not on file   Social History Narrative   Married 1 son    Retired Therapist, art   One caffeinated beverage a day no alcohol   He was living in [employer] at the [address] met in [place] and has located to [place] recently.   08/09/2015  REVIEW OF SYSTEMS: Constitutional:  Per HPI ENT:  No nose bleeds Pulm:  No SOB or cough CV:  No palpitations, no LE edema. No chest pain GU:  No hematuria, no frequency.  Some urinary incontinence. Currently a condom catheter is in place. GI:  No issues with dysphagia. No heartburn, no abdominal pain, no nausea Heme:  Per HPI   Transfusions:  Per HPI.  Last transfusion was around May 2016 at the time of acute epistaxis. Neuro:  No headaches, no peripheral tingling or numbness Derm:  No itching, no rash or sores.  Endocrine:  No sweats or chills.  No polyuria or dysuria Immunization:  Did not inquire. Travel:  None beyond local counties in last few months.    PHYSICAL EXAM: Vital signs in last 24 hours: Filed Vitals:   08/11/15 0751  BP: 100/48  Pulse: 80  Temp: 97.6 F (36.4 C)  Resp: 18   Wt Readings from Last 3 Encounters:  08/11/15 175 lb 11.3 oz (79.7 kg)  08/09/15 175 lb 2 oz (79.436 kg)  04/22/15 155 lb (70.308 kg)    General: Somnolent but arousable. Looks chronically ill and frail. Head:  No facial asymmetry or swelling. No signs of head trauma.  Eyes:  Telangiectasia visible in the right lower lid. No injected sclera. Conjunctiva somewhat pale. Ears:  Hearing intact though slightly diminished  Nose:  No discharge, no blood in the nares. Mouth:  Clear, moist MM. Good dentition. Tongue midline. Neck:  No JVD, no TMG. Lungs:  Clear bilaterally. No labored breathing or cough. Heart: RRR. No MRG. S1/S2 audible. Abdomen:  Soft, ND, NT. Bowel sounds active. No organomegaly..   Rectal: Dark brown/black stool tests 2-3+ FOBT+   Musc/Skeltl: Some wasting of the muscles in the thighs. No gross joint deformities, erythema or contractures. Extremities:   No CCE.  Neurologic:  Patient able to tell me his name, year and situation. He is somnolent but arousable. If not constantly stimulated he falls back to "sleep".  Positive asterixis as well as a resting upper extremity tremor. Skin:  Telangiectasia visible on the torso and limbs. Tattoos:  None Nodes:  No cervical adenopathy.   Psych:  Calm, without agitation.  Intake/Output from previous day: 08/30 0701 - 08/31 0700 In: 940.9 [I.V.:70; Blood:870.9] Out: 900 [Urine:900] Intake/Output this shift: Total I/O In: 120 [P.O.:120] Out: -   LAB RESULTS:  Recent Labs  08/09/15 1621 08/10/15 1320 08/10/15 1337 08/10/15 2236 08/11/15 0532  WBC 3.6* 3.9*  --   --  3.1*  HGB 5.5 Repeated and verified X2.* 5.7* 6.5* 6.4* 7.2*  HCT 17.3 Repeated and verified X2.* 18.3* 19.0* 20.3* 22.6*  PLT 56.0 Repeated and verified X2.* 64*  --   --  50*   BMET Lab Results  Component Value Date   NA 123* 08/11/2015   NA 122* 08/10/2015   NA 124* 08/10/2015   K 4.0 08/11/2015   K 4.5 08/10/2015   K 4.6 08/10/2015   CL 85* 08/11/2015   CL 84* 08/10/2015   CL 88* 08/10/2015   CO2 27 08/11/2015   CO2 27 08/10/2015   CO2 29 08/09/2015   GLUCOSE 90 08/11/2015   GLUCOSE 114* 08/10/2015   GLUCOSE 121* 08/10/2015   BUN 65* 08/11/2015   BUN 74* 08/10/2015   BUN 70* 08/10/2015   CREATININE 2.18* 08/11/2015   CREATININE 2.50* 08/10/2015   CREATININE 2.39* 08/10/2015   CALCIUM 8.1* 08/11/2015   CALCIUM 8.7* 08/10/2015   CALCIUM 8.4 08/09/2015  LFT  Recent Labs  08/09/15 1621 08/10/15 1320  PROT 4.9* 4.8*  ALBUMIN 2.8* 2.6*  AST 30 40  ALT 26 29  ALKPHOS 176* 175*  BILITOT 2.0* 2.5*   PT/INR Lab Results  Component Value Date   INR 1.33 08/10/2015   INR 1.2* 08/09/2015   INR 1.21 04/22/2015    RADIOLOGY STUDIES: No results found.  ENDOSCOPIC STUDIES: No  EGD Colonoscopy >5 yrs ago.  No polyps per wife's recall.     IMPRESSION:   *  Acute on chronic blood loss anemia in pt  with hereditary hemorrhagic telangiectasia with hepatic AVMs.  S/p PRBC x 3 with appropriate rise in hemoglobin.  Suspect telangiectasia of the GI tract are responsible for the anemia and FOBT + stool.  He also may have portal hypertension as well as varices.   *  Cirrhosis due to right heart failure.  *  FOBT +.  No previous EGD.  Last colonoscopy at least 5 years ago.    *  Hyponatremia  *  Hepatic encephalopathy.    *  CKD   PLAN:     *  Per Dr Fuller Plan.  ? EGD?, ?colonoscopy? May be best to just begin with upper endoscopy to assess for telangiectasia as well as for varices and portal hypertension.  *  Repeat CBC in the morning.  Try to limit excessive blood draws.  *  Agree with your plans to involve palliative care for goals of care consult.  *  Patient does not need twice daily Protonix. He doesn't take any PPI at home. We will drop this to once daily for the time being   Azucena Freed  08/11/2015, 9:21 AM Pager: [phone]      Attending physician's note   I have taken a history, examined the patient and reviewed the chart. I agree with the Advanced Practitioner's note, impression and recommendations. Severe anemia and heme + stool in pt with HHT, hepatic AVMs, cirrhosis and right heart failure. Suspected GI tract AVMs with bleeding. Portal gastropathy, ulcer, etc. also possible. Transfuse to Hb >8.  Monitor CBC. Further evaluation with EGD and colonoscopy is appropriate, when Na improves and with platelets over 50k. EGD probably most important at this time and EGD was recommended however pt is not agreeable to proceed. He is under the impression that EGD/colonoscopy will likely cause nasal cavity or GI tract bleeding given his HHT. This is possible but unlikely. Will discuss with him again tomorrow and reassess.   Ladene Artist, MD [facility] Regal [phone] pager Mon-Fri 8a-5p [phone] weekends, holidays and 5p-8a or per [facility]

## 2015-08-13 NOTE — Op Note (Signed)
[facility] [place], [address]   ENDOSCOPY PROCEDURE REPORT  PATIENT: Hunter [name], Hunter [name]  MR#: [id number] BIRTHDATE: [birth year] , 48  yrs. old GENDER: male ENDOSCOPIST: Ladene Artist, MD, [facility] REFERRED BY:  Trinda Pascal   MD PROCEDURE DATE:  08/13/2015 PROCEDURE:  EGD w/ control of bleeding ASA CLASS:     Class III INDICATIONS:  iron deficiency anemia and heme positive stool. MEDICATIONS: Monitored anesthesia care and Per Anesthesia TOPICAL ANESTHETIC: none DESCRIPTION OF PROCEDURE: After the risks benefits and alternatives of the procedure were thoroughly explained, informed consent was obtained.  The Pentax Gastroscope [id number] endoscope was introduced through the mouth and advanced to the second portion of the duodenum , Without limitations.  The instrument was slowly withdrawn as the mucosa was fully examined.    STOMACH: Two medium sized bleeding angiodysplastic lesions were found in the gastric fundus and cardia.  Argon plasma coagulation was applied to the sites.  With complete hemostasis achieved.   Six medium sized angiodysplastic lesions were found in the gastric fundus and gastric body.  Argon plasma coagulation was applied to the sites.  With good treatment effect.   The stomach otherwise appeared normal.  No gastric varices and no changes of portal gastropathy noted. ESOPHAGUS: The mucosa of the esophagus appeared normal. No esophageal varices noted. DUODENUM: The duodenal mucosa showed no abnormalities in the bulb and 2nd part of the duodenum.  Retroflexed views revealed a small hiatal hernia.     The scope was then withdrawn from the patient and the procedure completed.  COMPLICATIONS: There were no immediate complications.  ENDOSCOPIC IMPRESSION: 1.   Bleeding angiodysplastic lesions in the gastric fundus and cardia; APC applied with complete hemostasis achieved 2.   Angiodysplastic lesions in the gastric fundus  and gastric body; APC applied with good treatment effect 3.   Small hiatal hernia 4.   The EGD otherwise appeared normal  RECOMMENDATIONS: 1.  Avoid NSAIDS long term  eSigned:  Ladene Artist, MD, [facility] 08/13/2015 9:41 AM

## 2015-08-13 NOTE — Progress Notes (Signed)
PT Cancellation Note  Patient Details Name: Hunter [name] MRN: [id number] DOB: [birth year]   Cancelled Treatment:    Reason Eval/Treat Not Completed: Medical issues which prohibited therapy. Noted that pt is currently hypokalemic with a potassium of 2.9 mmol/L. Does not appear that K+ correction has been initiated at this time. Will hold PT eval and check back as schedule allows for medical readiness to participate.    Rolinda Roan 08/13/2015, 2:01 PM   Rolinda Roan, PT, DPT Acute Rehabilitation Services Pager: [phone]

## 2015-08-13 NOTE — Progress Notes (Signed)
Family Medicine Teaching Service Daily Progress Note Intern Pager: [phone]  Patient name: Hunter [name] Medical record number: [id number] Date of birth: 02/24/1935 Age: 80 y.o. Gender: male  Primary Care Provider: No PCP Per Patient Consultants: Palliative, GI, HF Code Status: DNR  Pt Overview and Major Events to Date:  - 9/1 received 4th unit RBC with post hgb of 7.4; received 5th unit RBC with post hgb 8.5 with stable hgb at 8.3 this morning - HF team ordered IV lasix 80mg ; given 40mg  due to low SBP in upper 90s. - NPO since midnight for EGD today   Assessment and Plan: Fernando Stoiber is a 80 y.o. male presenting with fatigue and severe anemia. 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 Suspect worsening of cirrhosis 2/2 CHF.  # Anemia:   Hgb of 5.7 in the ED and 5.5 at GI outpt visit. Baseline 7-8. FOBT positive in the ED. Received 5th unit RBC 9/1 with post transfusion hgb 8.5 with repeat stable this morning at 8.3. - EGD today: noted bleeding angiodysplastic lesions in gastric fundus and cardia for which APC applied resulting in complete hemostasis. APC applied to lesion in gastric fundus and gastric body.  - Continue home ferrous sulfate. - Monitor BPs.  - monitor Hgb with CBC tomorrow  # Cirrhosis/liver failure: Believed to be secondary to hepatic AV fistula and CHF. Care everywhere review shows Hep A, B, C negative 07/21/2015. Hospitalized with hepatic encephalopathy 07/17/15 and 06/30/15. Ammonia elevated at 133. (Recent levels have ranged from 83-201). AST/ALT WNL. Wife notes of multiple BMs yesterday and overnight.  - Lactulose 30mg  QID - Home rifaximin BID.  - Palliative Care consulted: spoke with palliative care this morning. Stated patient is more open to hospice while wife is a little resistant. Patient wanted to first  start with home health to provide PT so patient is able to regain some strength. Also discussed the idea of utilizing hospice after home health PT. Palliative care updated case manager regarding this. Recommends offering hospice after home health ends and also outpatient palliative care for further discussion.   # CHF: ECHO from 02/09/2015 showed LVEF 50-55% with grade 2 diastolic filling pattern, enlarged left and right ventricles, as well as severely dilated right and left atrium and tricuspid and mitral regurgitation. EKG showed pacer spikes and prolonged QTc. Digoxin level 0.6. HF team ordered IV lasix 80mg ; given 40mg  due to low SBP in upper 90s. Urine output 1.860L over past 24 hrs. Some improvement in swelling today   - CHF team following, appreciate recommendations.  - Digoxin   # Dark, Tarry Stools: chronically taking iron tablets - Protonix 40 mg daily   # Hyponatremia: appears to be chronic from liver failure. Care everywhere review shows low sodium 120s during hospitalization earlier this month, July in mid/upper 120s. Asymptomatic.  - Chronic. May administer NS IVFs if BP remains low.  # Hypotension: SBP in upper 80s to mid90s. - Holding home flomax, metolazone.  - restarted torsemide, spironolactone 8/31 - NS 19mL q 12 hrs  # CKD IV: SCr 2.5 on admission. Improved to 2.18 on 9/1.  BUN/Cr 28.8 points to prerenal etiology. Cautious in giving fluids due to edema. SCr baseline per care everywhere review appears to be 1.4 back in March, but most recently 1.8 over past 2 months.  -  continue to monitor  # Thrombocytopenia: Chronic. 50 < 64.  Today 70 (from 75 yesterday)  - Continue to monitor with CBCs   FEN/GI: Regular diet Prophylaxis: protonix, promethazine  Disposition: Admitted to [facility] for blood transfusion and care coordination.  Subjective:  Saw patient after EGD. Patient states he is doing okay. Notes of cough after procedure. Wife states  patient had 2 BMs yesterday and multiple BM overnight. Wife also states that his swelling has improved from yesterday.   Objective: Temp:  [97.3 F (36.3 C)-98.9 F (37.2 C)] 97.3 F (36.3 C) (09/02 0827) Pulse Rate:  [79-86] 86 (09/02 0827) Resp:  [12-18] 12 (09/02 0827) BP: (85-99)/(30-51) 99/41 mmHg (09/02 0827) SpO2:  [96 %-98 %] 98 % (09/02 0827) Weight:  [180 lb 4.8 oz (81.784 kg)-180 lb 8 oz (81.874 kg)] 180 lb 8 oz (81.874 kg) (09/02 0430) Physical Exam: General: NAD.  HEENT: NCAT. No scleral icterus. MMM.  Cardiovascular: RRR. Systolic murmur appreciated over left and right lower sternal borders. 2+ pedal pulses.     Respiratory: mild crackles noted at bases bilaterally   Abdomen: +BS, soft, nontender but distended; possible palpation of stool in lower abdomen.  MSK: + asterixis. Pitting edema improved from yesterday and now below knees bilaterallly Skin:  Diffuse telangiectasias.  Neuro: Alert and answers questions appropriately. Cooperative with exam. Laboratory:  Recent Labs Lab 08/12/15 1325 08/12/15 2203 08/13/15 0520  WBC 4.2 4.6 4.5  HGB 7.4* 8.5* 8.3*  HCT 23.1* 26.2* 25.8*  PLT 49* 48* 45*    Recent Labs Lab 08/09/15 1621 08/10/15 1320 08/10/15 1337 08/11/15 0532 08/12/15 0551  NA 122* 124* 122* 123* 120*  K 4.6 4.6 4.5 4.0 3.6  CL 87* 88* 84* 85* 84*  CO2 29 27  --  27 27  BUN 70* 70* 74* 65* 63*  CREATININE 2.19* 2.39* 2.50* 2.18* 2.18*  CALCIUM 8.4 8.7*  --  8.1* 8.1*  PROT 4.9* 4.8*  --   --   --   BILITOT 2.0* 2.5*  --   --   --   ALKPHOS 176* 175*  --   --   --   ALT 26 29  --   --   --   AST 30 40  --   --   --   GLUCOSE 107* 121* 114* 90 82   Imaging/Diagnostic Tests: EGD 9/2: noted bleeding angiodysplastic lesions in gastric fundus and cardia for which APC applied resulting in complete hemostasis. APC applied to lesion in gastric fundus and gastric body.   Smiley Houseman, MD 08/13/2015, 8:46 AM PGY-1, [place] Intern pager: [phone], text pages welcome

## 2015-08-13 NOTE — Care Management Important Message (Signed)
Important Message  Patient Details  Name: Hunter [name] MRN: [id number] Date of Birth: 07/18/1935   Medicare Important Message Given:  Yes-second notification given    Pricilla Handler 08/13/2015, 12:46 PM

## 2015-08-14 DIAGNOSIS — K31819 Angiodysplasia of stomach and duodenum without bleeding: Secondary | ICD-10-CM

## 2015-08-14 LAB — BASIC METABOLIC PANEL
ANION GAP: 11 (ref 5–15)
BUN: 59 mg/dL — AB (ref 6–20)
CALCIUM: 8.2 mg/dL — AB (ref 8.9–10.3)
CO2: 27 mmol/L (ref 22–32)
Chloride: 91 mmol/L — ABNORMAL LOW (ref 101–111)
Creatinine, Ser: 1.99 mg/dL — ABNORMAL HIGH (ref 0.61–1.24)
GFR calc Af Amer: 36 mL/min — ABNORMAL LOW (ref >60)
GFR, EST NON AFRICAN AMERICAN: 31 mL/min — AB (ref >60)
GLUCOSE: 118 mg/dL — AB (ref 65–99)
Potassium: 3 mmol/L — ABNORMAL LOW (ref 3.5–5.1)
Sodium: 129 mmol/L — ABNORMAL LOW (ref 135–145)

## 2015-08-14 LAB — CBC
HCT: 27.1 % — ABNORMAL LOW (ref 39.0–52.0)
Hemoglobin: 8.7 g/dL — ABNORMAL LOW (ref 13.0–17.0)
MCH: 28.7 pg (ref 26.0–34.0)
MCHC: 32.1 g/dL (ref 30.0–36.0)
MCV: 89.4 fL (ref 78.0–100.0)
PLATELETS: 50 10*3/uL — AB (ref 150–400)
RBC: 3.03 MIL/uL — ABNORMAL LOW (ref 4.22–5.81)
RDW: 18.8 % — AB (ref 11.5–15.5)
WBC: 7 10*3/uL (ref 4.0–10.5)

## 2015-08-14 MED ORDER — LACTULOSE 10 GM/15ML PO SOLN
30.0000 g | Freq: Two times a day (BID) | ORAL | Status: DC
  Administered 2015-08-14 – 2015-08-15 (×2): 30 g via ORAL
  Filled 2015-08-14 (×2): qty 45

## 2015-08-14 MED ORDER — POTASSIUM CHLORIDE CRYS ER 20 MEQ PO TBCR
40.0000 meq | EXTENDED_RELEASE_TABLET | Freq: Once | ORAL | Status: AC
  Administered 2015-08-14: 40 meq via ORAL
  Filled 2015-08-14: qty 2

## 2015-08-14 NOTE — Evaluation (Signed)
Physical Therapy Evaluation Patient Details Name: Hunter [name] MRN: [id number] DOB: 03/19/1935 Today's Date: 08/14/2015   History of Present Illness  Hunter [name] is a 80 y.o. male. Newly arrived to [place] from [place] one week ago.[name] Hx MVR, right heart failure. S/p pacemaker. Hx A fib. Remotely, perhaps 15 years ago or more, while living in [facility], the patient underwent resection of part of his colon. His wife of 8 years is under the impression that this was done because of bleeding issues. 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 Hepatic encephalopathy on Xifaxan and Lactulose. 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 Patient last received blood transfusion around May 2016 at the time of acute epistaxis. Underwent EGD 08/13/15  Clinical Impression  Patient evaluated by Physical Therapy with anticipated discharge later today, further needs to be addressed in [facility] setting. All education has been completed and the patient has no further questions.  See below for any follow-up Physial Therapy or equipment needs. PT is signing off. Thank you for this referral.      Follow Up Recommendations Home health PT;Supervision for mobility/OOB    Equipment Recommendations  None recommended by PT    Recommendations for Other Services       Precautions / Restrictions Precautions Precautions: Fall Precaution Comments: wife reports no falls at home but she feels the need to supervise all mobility because of fall risk Restrictions Weight Bearing Restrictions: No      Mobility  Bed Mobility Overal bed mobility: Modified Independent             General bed mobility comments: pt able to get to EOB without assist as well as scoot to edge with vc's  Transfers Overall transfer level: Needs assistance Equipment used: Rolling walker (2 wheeled) Transfers: Sit to/from Stand Sit to Stand: Supervision          General transfer comment: supervision with vc's for hand placement because pt first placed both hands on RW. Admits though that this is not the way he does it at home  Ambulation/Gait Ambulation/Gait assistance: Min guard Ambulation Distance (Feet): 250 Feet Assistive device: Rolling walker (2 wheeled) Gait Pattern/deviations: Step-through pattern;Drifts right/left;Ataxic Gait velocity: decreased   General Gait Details: pt has some difficulty navigating around obstacles, hit 3 with multiple attempts to move RW away before succeeding. Slow initiation of gait, pattern appears to be somewhat festinating. Drifts right and left but no full LOB with RW.  Stairs            Wheelchair Mobility    Modified Rankin (Stroke Patients Only)       Balance Overall balance assessment: Needs assistance Sitting-balance support: No upper extremity supported Sitting balance-Leahy Scale: Fair   Postural control: Posterior lean Standing balance support: No upper extremity supported Standing balance-Leahy Scale: Fair                               Pertinent Vitals/Pain Pain Assessment: No/denies pain  HR 83 bpm with ambulation    Home Living Family/patient expects to be discharged to:: Private residence Living Arrangements: Spouse/significant other Available Help at Discharge: Family;Available 24 hours/day Type of Home: House Home Access: Stairs to enter Entrance Stairs-Rails: Right Entrance Stairs-Number of Steps: 2 Home Layout: Two level;Able to live on main level with bedroom/bathroom Home Equipment: Gilford Rile - 2 wheels Additional Comments: Just moved here from [place] for closer medical care. Pt  was always active, hiked AT, biked, etc.    Prior Function Level of Independence: Needs assistance   Gait / Transfers Assistance Needed: ambulates with RW with supervision  ADL's / Homemaking Assistance Needed: wife helps with bathing and dressing. Pt has not been going out of house  with wife lately for errands, meals, etc        Hand Dominance        Extremity/Trunk Assessment   Upper Extremity Assessment: Generalized weakness           Lower Extremity Assessment: Generalized weakness      Cervical / Trunk Assessment: Normal  Communication   Communication: HOH (has hearing aides bilaterally)  Cognition Arousal/Alertness: Lethargic Behavior During Therapy: Flat affect Overall Cognitive Status: Impaired/Different from baseline Area of Impairment: Problem solving     Memory: Decreased short-term memory       Problem Solving: Slow processing;Decreased initiation;Requires verbal cues General Comments: slow to respond and with slow movement initiation noted as well    General Comments General comments (skin integrity, edema, etc.): resting tremor noted, pt's wife reports this is present at baseline    Exercises        Assessment/Plan    PT Assessment All further PT needs can be met in the next venue of care  PT Diagnosis Abnormality of gait;Generalized weakness   PT Problem List Decreased strength;Decreased activity tolerance;Decreased balance;Decreased cognition;Decreased coordination;Decreased mobility;Decreased knowledge of precautions  PT Treatment Interventions     PT Goals (Current goals can be found in the Care Plan section) Acute Rehab PT Goals Patient Stated Goal: return home, go out to dinner with spouse PT Goal Formulation: All assessment and education complete, DC therapy    Frequency     Barriers to discharge        Co-evaluation               End of Session Equipment Utilized During Treatment: Gait belt Activity Tolerance: Patient tolerated treatment well Patient left: in chair;with call bell/phone within reach;with family/visitor present Nurse Communication: Mobility status         Time: 1020-1046 PT Time Calculation (min) (ACUTE ONLY): 26 min   Charges:   PT Evaluation $Initial PT Evaluation Tier I:  1 Procedure PT Treatments $Gait Training: 8-22 mins   PT G Codes:      Leighton Roach, PT  Acute Rehab Services  [place], [place] 08/14/2015, 11:02 AM

## 2015-08-14 NOTE — Progress Notes (Signed)
Family Medicine Teaching Service Daily Progress Note Intern Pager: [phone]  Patient name: Hunter [name] Medical record number: [id number] Date of birth: 10-07-1935 Age: 80 y.o. Gender: male  Primary Care Provider: No PCP Per Patient Consultants: Palliative, GI, HF Code Status: DNR  Pt Overview and Major Events to Date:  - 9/1 received 4th unit RBC with post hgb of 7.4; received 5th unit RBC with post hgb 8.5 with stable hgb at 8.3  - HF team ordered IV lasix 80mg ; given 40mg  due to low SBP in upper 90s. - 9/2:EGD    Assessment and Plan: Hunter [name] is a 80 y.o. male presenting with fatigue and severe anemia. 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 Suspect worsening of cirrhosis 2/2 CHF.  # Anemia:   Hgb of 5.7 in the ED and 5.5 at GI outpt visit. Baseline 7-8. FOBT positive in the ED. Received 5th unit RBC 9/1 with post transfusion hgb 8.5 with repeat stable this morning at 8.7 - EGD noted bleeding angiodysplastic lesions in gastric fundus and cardia for which APC applied resulting in complete hemostasis. APC applied to lesion in gastric fundus and gastric body.  - Continue home ferrous sulfate. - Monitor BPs.  - monitor Hgb with CBC   # Cirrhosis/liver failure: Believed to be secondary to hepatic AV fistula and CHF. Care everywhere review shows Hep A, B, C negative 07/21/2015. Hospitalized with hepatic encephalopathy 07/17/15 and 06/30/15. Ammonia elevated at 133. (Recent levels have ranged from 83-201). AST/ALT WNL. Wife notes of multiple BMs yesterday and overnight.  - Lactulose 30mg  QID - Home rifaximin BID.   - Palliative Care consulted: spoke with palliative care Stated patient is more open to hospice while wife is a little resistant. Patient wanted to first start with home health to provide PT so patient is able to regain  some strength. Also discussed the idea of utilizing hospice after home health PT. Palliative care updated case manager regarding this. Recommends offering hospice after home health ends and also outpatient palliative care for further discussion.   # CHF: ECHO from 02/09/2015 showed LVEF 50-55% with grade 2 diastolic filling pattern, enlarged left and right ventricles, as well as severely dilated right and left atrium and tricuspid and mitral regurgitation. EKG showed pacer spikes and prolonged QTc. Digoxin level 0.6 - CHF team following, appreciate recommendations.  - Digoxin  - torsemide 100 mg PO qd - spironolactone 25 mg qd  # Dark, Tarry Stools: chronically taking iron tablets - Protonix 40 mg daily   # Hyponatremia: appears to be chronic from liver failure. Care everywhere review shows low sodium 120s during hospitalization earlier this month, July in mid/upper 120s. Asymptomatic.  - Chronic. May administer NS IVFs if BP remains low.  # Hypotension: SBP in upper 80s to mid90s. - Holding home flomax, metolazone.  - restarted torsemide, spironolactone 8/31  # CKD IV: SCr 2.5 on admission. Improved to 2.18 on 9/1.  BUN/Cr 28.8 points to prerenal etiology. Cautious in giving fluids due to edema. SCr baseline per care everywhere review appears to be 1.4 back in March, but most recently 1.8 over past 2 months.  -  continue to monitor  # Thrombocytopenia: Chronic. 50 < 64. - Continue to monitor with CBCs   FEN/GI: Regular diet Prophylaxis: protonix, promethazine  Disposition: Admitted to [place] for blood transfusion and care coordination.  Subjective:  Doing better today, but continues to feel weak. Was able to ambulate with PT. Denies dizziness / balance difficulties.   Objective: Temp:  [98 F (36.7 C)-99.1 F (37.3 C)] 98.8 F (37.1 C) (09/03 0402) Pulse Rate:  [77-82] 80 (09/03 0402) Resp:  [16-18] 17 (09/03 0402) BP: (80-102)/(42-53) 84/42 mmHg  (09/03 0402) SpO2:  [96 %-100 %] 98 % (09/03 0402) Physical Exam: General: NAD.  HEENT: NCAT. No scleral icterus. MMM.  Cardiovascular: RRR. Systolic murmur appreciated over left and right lower sternal borders. 2+ pedal pulses.     Respiratory: mild crackles noted at bases bilaterally   Abdomen: +BS, soft, nontender but distended;   MSK:  Pitting edema +1 Skin:  Diffuse telangiectasias.  Neuro: Alert and answers questions appropriately. Cooperative with exam. Laboratory:  Recent Labs Lab 08/12/15 2203 08/13/15 0520 08/14/15 0300  WBC 4.6 4.5 7.0  HGB 8.5* 8.3* 8.7*  HCT 26.2* 25.8* 27.1*  PLT 48* 45* 50*    Recent Labs Lab 08/09/15 1621 08/10/15 1320  08/12/15 0551 08/13/15 1215 08/14/15 0300  NA 122* 124*  < > 120* 126* 129*  K 4.6 4.6  < > 3.6 2.9* 3.0*  CL 87* 88*  < > 84* 89* 91*  CO2 29 27  < > 27 27 27   BUN 70* 70*  < > 63* 61* 59*  CREATININE 2.19* 2.39*  < > 2.18* 2.17* 1.99*  CALCIUM 8.4 8.7*  < > 8.1* 8.2* 8.2*  PROT 4.9* 4.8*  --   --   --   --   BILITOT 2.0* 2.5*  --   --   --   --   ALKPHOS 176* 175*  --   --   --   --   ALT 26 29  --   --   --   --   AST 30 40  --   --   --   --   GLUCOSE 107* 121*  < > 82 121* 118*  < > = values in this interval not displayed. Imaging/Diagnostic Tests: EGD 9/2: noted bleeding angiodysplastic lesions in gastric fundus and cardia for which APC applied resulting in complete hemostasis. APC applied to lesion in gastric fundus and gastric body.   Olam Idler, MD 08/14/2015, 10:24 AM PGY-3, [place] Intern pager: [phone], text pages welcome

## 2015-08-14 NOTE — Progress Notes (Signed)
Daily Rounding Note  08/14/2015, 9:09 AM  LOS: 4 days   SUBJECTIVE:       No further epistaxis.  Several BMs overnight.  Oriented and appropriate earlier this AM per his wife and staff. Not c/o pain.   OBJECTIVE:         Vital signs in last 24 hours:    Temp:  [97.9 F (36.6 C)-99.1 F (37.3 C)] 98.8 F (37.1 C) (09/03 0402) Pulse Rate:  [77-82] 80 (09/03 0402) Resp:  [12-18] 17 (09/03 0402) BP: (80-102)/(42-54) 84/42 mmHg (09/03 0402) SpO2:  [96 %-100 %] 98 % (09/03 0402) Last BM Date: 08/14/15 Filed Weights   08/11/15 0626 08/12/15 0943 08/13/15 0430  Weight: 175 lb 11.3 oz (79.7 kg) 180 lb 4.8 oz (81.784 kg) 180 lb 8 oz (81.874 kg)   General: somnolent, did not awaken pt for exam.     Heart: RRR Chest: clear bil.  No labored resps or cough Abdomen: soft, NT, somewhat protuberant  Extremities: no CCE.  Some muscle wasting in LE  Neuro/Psych:  Did not awaken during exam.  + asteixis.   Intake/Output from previous day: 09/02 0701 - 09/03 0700 In: 106 [I.V.:106] Out: 2156 [Urine:1950; Emesis/NG output:200; Stool:1; Blood:5]  Intake/Output this shift:    Lab Results:  Recent Labs  08/12/15 2203 08/13/15 0520 08/14/15 0300  WBC 4.6 4.5 7.0  HGB 8.5* 8.3* 8.7*  HCT 26.2* 25.8* 27.1*  PLT 48* 45* 50*   BMET  Recent Labs  08/12/15 0551 08/13/15 1215 08/14/15 0300  NA 120* 126* 129*  K 3.6 2.9* 3.0*  CL 84* 89* 91*  CO2 27 27 27   GLUCOSE 82 121* 118*  BUN 63* 61* 59*  CREATININE 2.18* 2.17* 1.99*  CALCIUM 8.1* 8.2* 8.2*   LFT No results for input(s): PROT, ALBUMIN, AST, ALT, ALKPHOS, BILITOT, BILIDIR, IBILI in the last 72 hours. PT/INR  Recent Labs  08/12/15 0605  LABPROT 16.1*  INR 1.27   Hepatitis Panel No results for input(s): HEPBSAG, HCVAB, HEPAIGM, HEPBIGM in the last 72 hours.  Studies/Results: No results found.   Scheduled Meds: . sodium chloride   Intravenous Once  .  digoxin  0.125 mg Oral Once per day on Mon Wed Fri  . feeding supplement (ENSURE ENLIVE)  237 mL Oral BID BM  . ferrous sulfate  325 mg Oral BID WC  . lactulose  30 g Oral QID  . multivitamin  1 tablet Oral QHS  . pantoprazole  40 mg Oral Daily  . rifaximin  550 mg Oral BID  . sodium chloride  3 mL Intravenous Q12H  . spironolactone  25 mg Oral Daily  . torsemide  100 mg Oral Daily   Continuous Infusions:  PRN Meds:.fentaNYL (SUBLIMAZE) injection, promethazine   ASSESMENT:   * Acute on chronic blood loss anemia in pt with hereditary hemorrhagic telangiectasia with hepatic AVMs. S/p PRBC x 5.  Hgb improved.   EGD 9/2: to D2.  1. Bleeding angiodysplastic lesions in the gastric fundus and cardia; APC applied with complete hemostasis achieved 2. Angiodysplastic lesions in the gastric fundus and gastric body; APC applied with good treatment effect 3. Small hiatal hernia 4. The EGD otherwise appeared normal  *  Large volume epistaxis 9/1.  Hx same requiring packing,  Cauterization 04/2014.   *  Thrombocytopenia. Stable to somewhat improved platelets.   * Cirrhosis due to right heart failure.  * FOBT +. No previous EGD. Last  colonoscopy at least 5 years ago.   * Hyponatremia. Improved.   * Hepatic encephalopathy. On Rifaximin, lactulose.    * CKD.  AKI improved.    *  DNR.  Palliative care involved.  Prognosis is less than 6  Months.      PLAN   *  Supportive care, transfuse prn.  GI available prn.  Back off on Lactulose.    Hunter [name]  08/14/2015, 9:09 AM Pager: [phone]

## 2015-08-15 ENCOUNTER — Encounter (HOSPITAL_COMMUNITY): Payer: Self-pay | Admitting: Gastroenterology

## 2015-08-15 LAB — CBC
HEMATOCRIT: 26.7 % — AB (ref 39.0–52.0)
Hemoglobin: 8.4 g/dL — ABNORMAL LOW (ref 13.0–17.0)
MCH: 28.3 pg (ref 26.0–34.0)
MCHC: 31.5 g/dL (ref 30.0–36.0)
MCV: 89.9 fL (ref 78.0–100.0)
Platelets: 48 10*3/uL — ABNORMAL LOW (ref 150–400)
RBC: 2.97 MIL/uL — ABNORMAL LOW (ref 4.22–5.81)
RDW: 19.4 % — AB (ref 11.5–15.5)
WBC: 4.2 10*3/uL (ref 4.0–10.5)

## 2015-08-15 LAB — COMPREHENSIVE METABOLIC PANEL
ALBUMIN: 2.5 g/dL — AB (ref 3.5–5.0)
ALT: 26 U/L (ref 17–63)
AST: 37 U/L (ref 15–41)
Alkaline Phosphatase: 183 U/L — ABNORMAL HIGH (ref 38–126)
Anion gap: 8 (ref 5–15)
BUN: 53 mg/dL — AB (ref 6–20)
CHLORIDE: 90 mmol/L — AB (ref 101–111)
CO2: 27 mmol/L (ref 22–32)
Calcium: 8 mg/dL — ABNORMAL LOW (ref 8.9–10.3)
Creatinine, Ser: 1.8 mg/dL — ABNORMAL HIGH (ref 0.61–1.24)
GFR calc Af Amer: 40 mL/min — ABNORMAL LOW (ref >60)
GFR calc non Af Amer: 35 mL/min — ABNORMAL LOW (ref >60)
GLUCOSE: 90 mg/dL (ref 65–99)
POTASSIUM: 3.7 mmol/L (ref 3.5–5.1)
Sodium: 125 mmol/L — ABNORMAL LOW (ref 135–145)
Total Bilirubin: 3 mg/dL — ABNORMAL HIGH (ref 0.3–1.2)
Total Protein: 4.9 g/dL — ABNORMAL LOW (ref 6.5–8.1)

## 2015-08-15 MED ORDER — PANTOPRAZOLE SODIUM 40 MG PO TBEC: 40.0000 mg | DELAYED_RELEASE_TABLET | Freq: Every day | ORAL | Status: DC

## 2015-08-15 MED ORDER — LACTULOSE 10 GM/15ML PO SOLN: 30.0000 g | Freq: Two times a day (BID) | ORAL | Status: DC

## 2015-08-15 MED ORDER — ENSURE ENLIVE PO LIQD: 237.0000 mL | Freq: Two times a day (BID) | ORAL | Status: AC

## 2015-08-15 NOTE — Progress Notes (Signed)
Family Medicine Teaching Service Daily Progress Note Intern Pager: [phone]  Patient name: Hunter [name] Medical record number: [id number] Date of birth: 1935-04-28 Age: 80 y.o. Gender: male  Primary Care Provider: No PCP Per Patient Consultants: Palliative, GI, HF Code Status: DNR  Pt Overview and Major Events to Date:  - 9/1 received 4th unit RBC with post hgb of 7.4; received 5th unit RBC with post hgb 8.5 with stable hgb at 8.3  - HF team ordered IV lasix 80mg ; given 40mg  due to low SBP in upper 90s. - 9/2:EGD   - 9/4: Hgb stable home today  Assessment and Plan: Hunter [name] is a 80 y.o. male presenting with fatigue and severe anemia. 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 Suspect worsening of cirrhosis 2/2 CHF.  # Anemia:   Hgb of 5.7 in the ED and 5.5 at GI outpt visit. Baseline 7-8. FOBT positive in the ED. Received 5th unit RBC 9/1 with post transfusion hgb 8.5 with repeat stable this morning at 8.4 - EGD noted bleeding angiodysplastic lesions in gastric fundus and cardia for which APC applied resulting in complete hemostasis. APC applied to lesion in gastric fundus and gastric body.  - Continue home ferrous sulfate. - Monitor BPs.  - monitor Hgb with CBC   # Cirrhosis/liver failure: Believed to be secondary to hepatic AV fistula and CHF. Care everywhere review shows Hep A, B, C negative 07/21/2015. Hospitalized with hepatic encephalopathy 07/17/15 and 06/30/15. Ammonia elevated at 133. (Recent levels have ranged from 83-201). AST/ALT WNL. Wife notes of multiple BMs yesterday and overnight.  - Lactulose 30mg  BID - Home rifaximin BID.   - Palliative Care consulted: spoke with palliative care Stated patient is more open to hospice while wife is a little resistant. Patient wanted to first start with home health to provide PT  so patient is able to regain some strength. Also discussed the idea of utilizing hospice after home health PT. Palliative care updated case manager regarding this. Recommends offering hospice after home health ends and also outpatient palliative care for further discussion.   # CHF: ECHO from 02/09/2015 showed LVEF 50-55% with grade 2 diastolic filling pattern, enlarged left and right ventricles, as well as severely dilated right and left atrium and tricuspid and mitral regurgitation. EKG showed pacer spikes and prolonged QTc. Digoxin level 0.6 - CHF team following, appreciate recommendations.  - Digoxin  - torsemide 100 mg PO qd - spironolactone 25 mg qd  # Dark, Tarry Stools: chronically taking iron tablets - Protonix 40 mg daily   # Hyponatremia: appears to be chronic from liver failure. Care everywhere review shows low sodium 120s during hospitalization earlier this month, July in mid/upper 120s. Asymptomatic.  - Chronic. May administer NS IVFs if BP remains low.  # Hypotension: SBP in upper 80s to mid90s. - Holding home flomax, metolazone.  - restarted torsemide, spironolactone 8/31  # CKD IV: SCr 2.5 on admission. Improved to 2.18 on 9/1.  BUN/Cr 28.8 points to prerenal etiology. Cautious in giving fluids due to edema. SCr baseline per care everywhere review appears to be 1.4 back in March, but most recently 1.8 over past 2 months.  -  continue to monitor  # Thrombocytopenia: Chronic. 50 < 64. - Continue to monitor with CBCs   FEN/GI: Regular diet Prophylaxis: protonix, promethazine  Disposition: Admitted to [place] [facility] for blood transfusion  and care coordination.  Subjective:  Feels ready to go home today. Denies acute complaints, but continues to feel a little weak.    Objective: Temp:  [97.4 F (36.3 C)-98.3 F (36.8 C)] 97.4 F (36.3 C) (09/04 0500) Pulse Rate:  [80-81] 80 (09/04 0500) Resp:  [18-20] 20 (09/04 0500) BP: (96-103)/(44-58) 97/58  mmHg (09/04 0500) SpO2:  [96 %-99 %] 96 % (09/04 0500) Weight:  [173 lb 4.8 oz (78.608 kg)-177 lb 3.2 oz (80.377 kg)] 177 lb 3.2 oz (80.377 kg) (09/04 0500) Physical Exam: General: NAD.  HEENT: NCAT. No scleral icterus. MMM.  Cardiovascular: RRR. Systolic murmur appreciated over left and right lower sternal borders. 2+ pedal pulses.     Respiratory: mild crackles noted at bases bilaterally   Abdomen: +BS, soft, nontender but distended;   MSK:  Pitting edema +1 Skin:  Diffuse telangiectasias.  Neuro: Alert and answers questions appropriately. Cooperative with exam. Laboratory:  Recent Labs Lab 08/13/15 0520 08/14/15 0300 08/15/15 0645  WBC 4.5 7.0 4.2  HGB 8.3* 8.7* 8.4*  HCT 25.8* 27.1* 26.7*  PLT 45* 50* 48*    Recent Labs Lab 08/09/15 1621 08/10/15 1320  08/13/15 1215 08/14/15 0300 08/15/15 0645  NA 122* 124*  < > 126* 129* 125*  K 4.6 4.6  < > 2.9* 3.0* 3.7  CL 87* 88*  < > 89* 91* 90*  CO2 29 27  < > 27 27 27   BUN 70* 70*  < > 61* 59* 53*  CREATININE 2.19* 2.39*  < > 2.17* 1.99* 1.80*  CALCIUM 8.4 8.7*  < > 8.2* 8.2* 8.0*  PROT 4.9* 4.8*  --   --   --  4.9*  BILITOT 2.0* 2.5*  --   --   --  3.0*  ALKPHOS 176* 175*  --   --   --  183*  ALT 26 29  --   --   --  26  AST 30 40  --   --   --  37  GLUCOSE 107* 121*  < > 121* 118* 90  < > = values in this interval not displayed. Imaging/Diagnostic Tests: EGD 9/2: noted bleeding angiodysplastic lesions in gastric fundus and cardia for which APC applied resulting in complete hemostasis. APC applied to lesion in gastric fundus and gastric body.   Olam Idler, MD 08/15/2015, 9:38 AM PGY-3, [place] Intern pager: [phone], text pages welcome

## 2015-08-15 NOTE — Progress Notes (Signed)
[facility] aware of dc home with HH. See previous NCM notes. Jonnie Finner RN CCM Case Mgmt phone [phone]

## 2015-08-15 NOTE — Plan of Care (Signed)
Problem: Phase III Progression Outcomes Goal: Voiding independently Outcome: Completed/Met Date Met:  08/15/15 Client is incont. Of urine and stool at times

## 2015-08-15 NOTE — Discharge Instructions (Signed)
Low-Sodium Eating Plan °Sodium raises blood pressure and causes water to be held in the body. Getting less sodium from food will help lower your blood pressure, reduce any swelling, and protect your heart, liver, and kidneys. We get sodium by adding salt (sodium chloride) to food. Most of our sodium comes from canned, boxed, and frozen foods. Restaurant foods, fast foods, and pizza are also very high in sodium. Even if you take medicine to lower your blood pressure or to reduce fluid in your body, getting less sodium from your food is important. °WHAT IS MY PLAN? °Most people should limit their sodium intake to 2,300 mg a day. Your health care provider recommends that you limit your sodium intake to __________ a day.  °WHAT DO I NEED TO KNOW ABOUT THIS EATING PLAN? °For the low-sodium eating plan, you will follow these general guidelines: °· Choose foods with a % Daily Value for sodium of less than 5% (as listed on the food label).   °· Use salt-free seasonings or herbs instead of table salt or sea salt.   °· Check with your health care provider or pharmacist before using salt substitutes.   °· Eat fresh foods. °· Eat more vegetables and fruits. °· Limit canned vegetables. If you do use them, rinse them well to decrease the sodium.   °· Limit cheese to 1 oz (28 g) per day.    °· Eat lower-sodium products, often labeled as "lower sodium" or "no salt added." °· Avoid foods that contain monosodium glutamate (MSG). MSG is sometimes added to Chinese food and some canned foods.   °· Check food labels (Nutrition Facts labels) on foods to learn how much sodium is in one serving. °· Eat more home-cooked food and less restaurant, buffet, and fast food.  °· When eating at a restaurant, ask that your food be prepared with less salt or none, if possible.   °HOW DO I READ FOOD LABELS FOR SODIUM INFORMATION? °The Nutrition Facts label lists the amount of sodium in one serving of the food. If you eat more than one serving, you must  multiply the listed amount of sodium by the number of servings. °Food labels may also identify foods as: °· Sodium free--Less than 5 mg in a serving. °· Very low sodium--35 mg or less in a serving. °· Low sodium--140 mg or less in a serving. °· Light in sodium--50% less sodium in a serving. For example, if a food that usually has 300 mg of sodium is changed to become light in sodium, it will have 150 mg of sodium. °· Reduced sodium--25% less sodium in a serving. For example, if a food that usually has 400 mg of sodium is changed to reduced sodium, it will have 300 mg of sodium. °WHAT FOODS CAN I EAT? °Grains  °Low-sodium cereals, including oats, puffed wheat and rice, and shredded wheat cereals. Low-sodium crackers. Unsalted rice and pasta. Lower-sodium bread.  °Vegetables  °Frozen or fresh vegetables. Low-sodium or reduced-sodium canned vegetables. Low-sodium or reduced-sodium tomato sauce and paste. Low-sodium or reduced-sodium tomato and vegetable juices.  °Fruits  °Fresh, frozen, and canned fruit. Fruit juice.  °Meat and Other Protein Products  °Low-sodium canned tuna and salmon. Fresh or frozen meat, poultry, seafood, and fish. Lamb. Unsalted nuts. Dried beans, peas, and lentils without added salt. Unsalted canned beans. Homemade soups without salt. Eggs.  °Dairy  °Milk. Soy milk. Ricotta cheese. Low-sodium or reduced-sodium cheeses. Yogurt.  °Condiments  °Fresh and dried herbs and spices. Salt-free seasonings. Onion and garlic powders. Low-sodium varieties of mustard and ketchup. Lemon juice.  °Fats and Oils   °  Reduced-sodium salad dressings. Unsalted butter.  Other Unsalted popcorn and pretzels.  The items listed above may not be a complete list of recommended foods or beverages. Contact your dietitian for more options. WHAT FOODS ARE NOT RECOMMENDED? Grains Instant hot cereals. Bread stuffing, pancake, and biscuit mixes. Croutons. Seasoned rice or pasta mixes. Noodle soup cups. Boxed or frozen  macaroni and cheese. Self-rising flour. Regular salted crackers. Vegetables Regular canned vegetables. Regular canned tomato sauce and paste. Regular tomato and vegetable juices. Frozen vegetables in sauces. Salted french fries. Olives. Angie Fava. Relishes. Sauerkraut. Salsa. Meat and Other Protein Products Salted, canned, smoked, spiced, or pickled meats, seafood, or fish. Bacon, ham, sausage, hot dogs, corned beef, chipped beef, and packaged luncheon meats. Salt pork. Jerky. Pickled herring. Anchovies, regular canned tuna, and sardines. Salted nuts. Dairy Processed cheese and cheese spreads. Cheese curds. Blue cheese and cottage cheese. Buttermilk.  Condiments Onion and garlic salt, seasoned salt, table salt, and sea salt. Canned and packaged gravies. Worcestershire sauce. Tartar sauce. Barbecue sauce. Teriyaki sauce. Soy sauce, including reduced sodium. Steak sauce. Fish sauce. Oyster sauce. Cocktail sauce. Horseradish. Regular ketchup and mustard. Meat flavorings and tenderizers. Bouillon cubes. Hot sauce. Tabasco sauce. Marinades. Taco seasonings. Relishes. Fats and Oils Regular salad dressings. Salted butter. Margarine. Ghee. Bacon fat.  Other Potato and tortilla chips. Corn chips and puffs. Salted popcorn and pretzels. Canned or dried soups. Pizza. Frozen entrees and pot pies.  The items listed above may not be a complete list of foods and beverages to avoid. Contact your dietitian for more information. Document Released: 05/19/2002 Document Revised: 12/02/2013 Document Reviewed: 10/01/2013 [facility] Patient Information 2015 [place], [place]. This information is not intended to replace advice given to you by your health care provider. Make sure you discuss any questions you have with your health care provider.   Heart Failure Heart failure means your heart has trouble pumping blood. This makes it hard for your body to work well. Heart failure is usually a long-term (chronic) condition.  You must take good care of yourself and follow your doctor's treatment plan. HOME CARE  Take your heart medicine as told by your doctor.  Do not stop taking medicine unless your doctor tells you to.  Do not skip any dose of medicine.  Refill your medicines before they run out.  Take other medicines only as told by your doctor or pharmacist.  Stay active if told by your doctor. The elderly and people with severe heart failure should talk with a doctor about physical activity.  Eat heart-healthy foods. Choose foods that are without trans fat and are low in saturated fat, cholesterol, and salt (sodium). This includes fresh or frozen fruits and vegetables, fish, lean meats, fat-free or low-fat dairy foods, whole grains, and high-fiber foods. Lentils and dried peas and beans (legumes) are also good choices.  Limit salt if told by your doctor.  Cook in a healthy way. Roast, grill, broil, bake, poach, steam, or stir-fry foods.  Limit fluids as told by your doctor.  Weigh yourself every morning. Do this after you pee (urinate) and before you eat breakfast. Write down your weight to give to your doctor.  Take your blood pressure and write it down if your doctor tells you to.  Ask your doctor how to check your pulse. Check your pulse as told.  Lose weight if told by your doctor.  Stop smoking or chewing tobacco. Do not use gum or patches that help you quit without your doctor's approval.  Schedule  and go to doctor visits as told.  Nonpregnant women should have no more than 1 drink a day. Men should have no more than 2 drinks a day. Talk to your doctor about drinking alcohol.  Stop illegal drug use.  Stay current with shots (immunizations).  Manage your health conditions as told by your doctor.  Learn to manage your stress.  Rest when you are tired.  If it is really hot outside:  Avoid intense activities.  Use air conditioning or fans, or get in a cooler place.  Avoid  caffeine and alcohol.  Wear loose-fitting, lightweight, and light-colored clothing.  If it is really cold outside:  Avoid intense activities.  Layer your clothing.  Wear mittens or gloves, a hat, and a scarf when going outside.  Avoid alcohol.  Learn about heart failure and get support as needed.  Get help to maintain or improve your quality of life and your ability to care for yourself as needed. GET HELP IF:   You gain 03 lb/1.4 kg or more in 1 day or 05 lb/2.3 kg in a week.  You are more short of breath than usual.  You cannot do your normal activities.  You tire easily.  You cough more than normal, especially with activity.  You have any or more puffiness (swelling) in areas such as your hands, feet, ankles, or belly (abdomen).  You cannot sleep because it is hard to breathe.  You feel like your heart is beating fast (palpitations).  You get dizzy or light-headed when you stand up. GET HELP RIGHT AWAY IF:   You have trouble breathing.  There is a change in mental status, such as becoming less alert or not being able to focus.  You have chest pain or discomfort.  You faint. MAKE SURE YOU:   Understand these instructions.  Will watch your condition.  Will get help right away if you are not doing well or get worse. Document Released: 09/05/2008 Document Revised: 04/13/2014 Document Reviewed: 01/13/2013 [facility] Patient Information 2015 [place], [place]. This information is not intended to replace advice given to you by your health care provider. Make sure you discuss any questions you have with your health care provider.   Gastrointestinal Bleeding Gastrointestinal bleeding is bleeding somewhere along the path that food travels through the body (digestive tract). This path is anywhere between the mouth and the opening of the butt (anus). You may have blood in your throw up (vomit) or in your poop (stools). If there is a lot of bleeding, you may need to stay in  the hospital. [place]  Only take medicine as told by your doctor.  Eat foods with fiber such as whole grains, fruits, and vegetables. You can also try eating 1 to 3 prunes a day.  Drink enough fluids to keep your pee (urine) clear or pale yellow. GET HELP RIGHT AWAY IF:   Your bleeding gets worse.  You feel dizzy, weak, or you pass out (faint).  You have bad cramps in your back or belly (abdomen).  You have large blood clumps (clots) in your poop.  Your problems are getting worse. MAKE SURE YOU:   Understand these instructions.  Will watch your condition.  Will get help right away if you are not doing well or get worse. Document Released: 09/05/2008 Document Revised: 11/13/2012 Document Reviewed: 11/06/2011 [facility] Patient Information 2015 [place], [place]. This information is not intended to replace advice given to you by your health care provider. Make sure you discuss  any questions you have with your health care provider. ° °

## 2015-08-17 ENCOUNTER — Telehealth: Payer: Self-pay | Admitting: Internal Medicine

## 2015-08-17 NOTE — Telephone Encounter (Signed)
OK to have OT but I will need a hard copy to specify further

## 2015-08-17 NOTE — Telephone Encounter (Signed)
[facility] asking for occupational therapy orders for this patient.  Please advise????

## 2015-08-17 NOTE — Telephone Encounter (Signed)
Beverlee Nims advised of the orders

## 2015-08-19 ENCOUNTER — Telehealth: Payer: Self-pay | Admitting: Internal Medicine

## 2015-08-19 NOTE — Telephone Encounter (Signed)
LoaAnn notified

## 2015-08-19 NOTE — Telephone Encounter (Signed)
Speech therapy order - evaluate and treat cough, dysphagia ? aspiration

## 2015-08-19 NOTE — Telephone Encounter (Signed)
Dr. Carlean Purl they are requesting now speech therapy.  Please advise

## 2015-08-27 ENCOUNTER — Telehealth: Payer: Self-pay | Admitting: Internal Medicine

## 2015-08-27 MED ORDER — RIFAXIMIN 550 MG PO TABS: 550.0000 mg | ORAL_TABLET | Freq: Two times a day (BID) | ORAL | Status: DC

## 2015-08-27 NOTE — Telephone Encounter (Signed)
Ok per Anna to send in refill as requested and [name] his [facility] was informed and confirmed pharmacy with her.

## 2015-08-27 NOTE — Telephone Encounter (Signed)
Patient has a skin tear on his arm.  Donita asked for an order to clean the area with NS, apply tripple antibiotic ointment and a telfa dressing.  Order given. She will call back for any additional concerns.

## 2015-09-02 ENCOUNTER — Telehealth (HOSPITAL_COMMUNITY): Payer: Self-pay | Admitting: Cardiology

## 2015-09-02 ENCOUNTER — Ambulatory Visit (HOSPITAL_COMMUNITY)
Admission: RE | Admit: 2015-09-02 | Discharge: 2015-09-02 | Disposition: A | Discharge status: Home / Self Care | Payer: Medicare Other | Source: Ambulatory Visit | Attending: Internal Medicine | Admitting: Internal Medicine

## 2015-09-02 ENCOUNTER — Encounter (HOSPITAL_COMMUNITY): Payer: Self-pay

## 2015-09-02 VITALS — BP 100/52 | HR 106 | Wt 177.1 lb

## 2015-09-02 DIAGNOSIS — K7469 Other cirrhosis of liver: Secondary | ICD-10-CM | POA: Diagnosis not present

## 2015-09-02 DIAGNOSIS — K7460 Unspecified cirrhosis of liver: Secondary | ICD-10-CM | POA: Insufficient documentation

## 2015-09-02 DIAGNOSIS — I509 Heart failure, unspecified: Secondary | ICD-10-CM

## 2015-09-02 DIAGNOSIS — I5032 Chronic diastolic (congestive) heart failure: Secondary | ICD-10-CM | POA: Diagnosis not present

## 2015-09-02 DIAGNOSIS — D649 Anemia, unspecified: Secondary | ICD-10-CM | POA: Insufficient documentation

## 2015-09-02 DIAGNOSIS — I272 Other secondary pulmonary hypertension: Secondary | ICD-10-CM | POA: Diagnosis not present

## 2015-09-02 DIAGNOSIS — Z79899 Other long term (current) drug therapy: Secondary | ICD-10-CM | POA: Diagnosis not present

## 2015-09-02 DIAGNOSIS — K7290 Hepatic failure, unspecified without coma: Secondary | ICD-10-CM | POA: Insufficient documentation

## 2015-09-02 DIAGNOSIS — N183 Chronic kidney disease, stage 3 (moderate): Secondary | ICD-10-CM | POA: Diagnosis not present

## 2015-09-02 DIAGNOSIS — I780 Hereditary hemorrhagic telangiectasia: Secondary | ICD-10-CM | POA: Insufficient documentation

## 2015-09-02 DIAGNOSIS — I50810 Right heart failure, unspecified: Secondary | ICD-10-CM

## 2015-09-02 DIAGNOSIS — Z950 Presence of cardiac pacemaker: Secondary | ICD-10-CM | POA: Insufficient documentation

## 2015-09-02 LAB — BASIC METABOLIC PANEL
ANION GAP: 11 (ref 5–15)
BUN: 39 mg/dL — ABNORMAL HIGH (ref 6–20)
CALCIUM: 8.8 mg/dL — AB (ref 8.9–10.3)
CO2: 25 mmol/L (ref 22–32)
CREATININE: 1.71 mg/dL — AB (ref 0.61–1.24)
Chloride: 99 mmol/L — ABNORMAL LOW (ref 101–111)
GFR, EST AFRICAN AMERICAN: 43 mL/min — AB (ref >60)
GFR, EST NON AFRICAN AMERICAN: 37 mL/min — AB (ref >60)
GLUCOSE: 128 mg/dL — AB (ref 65–99)
Potassium: 2.6 mmol/L — CL (ref 3.5–5.1)
Sodium: 135 mmol/L (ref 135–145)

## 2015-09-02 LAB — CBC
HCT: 24.4 % — ABNORMAL LOW (ref 39.0–52.0)
Hemoglobin: 7.5 g/dL — ABNORMAL LOW (ref 13.0–17.0)
MCH: 27.8 pg (ref 26.0–34.0)
MCHC: 30.7 g/dL (ref 30.0–36.0)
MCV: 90.4 fL (ref 78.0–100.0)
PLATELETS: 66 10*3/uL — AB (ref 150–400)
RBC: 2.7 MIL/uL — AB (ref 4.22–5.81)
RDW: 18.4 % — AB (ref 11.5–15.5)
WBC: 3.3 10*3/uL — ABNORMAL LOW (ref 4.0–10.5)

## 2015-09-02 LAB — BRAIN NATRIURETIC PEPTIDE: B Natriuretic Peptide: 280.5 pg/mL — ABNORMAL HIGH (ref 0.0–100.0)

## 2015-09-02 MED ORDER — SPIRONOLACTONE 50 MG PO TABS: 50.0000 mg | ORAL_TABLET | Freq: Every day | ORAL | Status: DC

## 2015-09-02 MED ORDER — DIGOXIN 125 MCG PO TABS: 0.1250 mg | ORAL_TABLET | ORAL | Status: DC

## 2015-09-02 MED ORDER — TORSEMIDE 20 MG PO TABS: 80.0000 mg | ORAL_TABLET | Freq: Every day | ORAL | Status: DC

## 2015-09-02 MED ORDER — METOLAZONE 2.5 MG PO TABS: 2.5000 mg | ORAL_TABLET | ORAL | Status: DC | PRN

## 2015-09-02 NOTE — Patient Instructions (Signed)
Change Torsemide to 80 mg (4 tabs) daily  Change Metolazone to 2.5 mg AS NEEDED FOR INCREASED SWELLING ONLY  Increase Spironolactone to 50 mg daily, we have sent you in a new prescription for 50 mg tablets  Labs today  Weekly labs with [facility]  Please wear compression stockings during the day, put them on as soon as you wake up and remove at night before going to bed

## 2015-09-02 NOTE — Progress Notes (Signed)
[facility] Note     Patient ID: Hunter [name], male   DOB: [birth year], 79 y.o.   MRN: [id number]   Primary Cardiologist: Dr. Claude Manges, in [place], had also been acting as PCP  HPI:  Hunter [name] is a 80 y.o. male with a history of diastolic HF Echo 07/19/97 EF 45-50% NYHA class III, pulmonary hypertension, liver failure, CKD stage III, and hereditary hemorrhagic telangiectasia with hepatic AVMs and associated anemia requiring transfusion.[name]  He was admitted to [facility] on 8/30 after labs from [place] on 8/29 showed marked anemia with Hemoglobin of 5 and hyponatremia.   They could not reach Hunter [name] by telephone until the morning of 8/30 and he reported to [facility] ED. He reported worsening fatigue and weakness. He has DOE at baseline. He is normally hypotensive with SBPs in the 90-100 range. Pertinent admission labs include K 4.5, Creatinine 2.5, BNP 188.0, and Hgb 5.7.  He had been taking 100 mg of Torsemide as needed for swelling, and would use it a couple times a week. He was determined to be stable from a HF stand point. Weight on discharge 177 lbs.  He presents today for post hospital follow up. Weight up slightly by home weights, but appetite has much improved from previous. Weight at home has been 172 lbs. Has been taking torsemide daily with Metolazone M/F.    Has not noted any SOB, but limited mostly by weakness/fatigue.Has been working with PT/OT at home and able to get around more. He denies Orthopnea/PND/CP. Has been off flowmax since hospital stay, but he hasn't noticed much of a difference.   Echo 08/11/15 45-50 %. RV normal. Marked biatrial enlargement with probable restrictive filling pattern. Moderate to severe MR. Moderate TR. PA peak pressure 51 mm Hg.  Records from [place]: Echo 3/16 EF 50-55%, Grade 2 DD, RV severely dilated Echo 3/15 EF 55-60%, mod enlarged RV, RV systolic function low normal. Increased LV filling pressures, Moderate MR  [facility]  01/19/15 RA 24/18, (20) RV 45/20 PA 45/30 (35) PCW systole 35 mean 30  [facility] 02/10/81 RA systolic 24, mean 18 RV 50/53 PA 50/25 (34) PCW 28 mean    Past Medical History  Diagnosis Date  . CHF (congestive heart failure)   . Pacemaker     single lead Medtronic pacemaker  . HHT (hereditary hemorrhagic telangiectasia)   . Anemia   . Pulmonary hypertension   . Osler-Weber-Rendu syndrome   . Renal insufficiency   . Liver failure   . Atrial fibrillation   . Hepatic AV fistula 08/09/2015  . 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 Anemia due to chronic blood loss 08/09/2015  . Right heart failure 08/09/2015  . Tricuspid regurgitation 08/09/2015  . Osler-Weber-Rendu syndrome     Current Outpatient Prescriptions  Medication Sig Dispense Refill  . b complex-vitamin c-folic acid (NEPHRO-VITE) 0.8 MG TABS tablet Take 1 tablet by mouth daily.    . Cyanocobalamin 1000 MCG/ML KIT Inject 1 Syringe as directed every 30 (thirty) days.    . digoxin (LANOXIN) 0.125 MG tablet Take 0.125 mg by mouth 3 (three) times a week. Mon, Wed, Fri    . feeding supplement, ENSURE ENLIVE, (ENSURE ENLIVE) LIQD Take 237 mLs by mouth 2 (two) times daily between meals. 237 mL 12  . ferrous sulfate 325 (65 FE) MG EC tablet Take 325 mg by mouth 2 (two) times daily with a meal.     . lactulose (CHRONULAC) 10 GM/15ML solution Take 45 mLs (30 g  total) by mouth 2 (two) times daily. 240 mL 0  . Magnesium 250 MG TABS Take 1 tablet by mouth daily.    . metolazone (ZAROXOLYN) 2.5 MG tablet Take 2.5 mg by mouth 2 (two) times a week. Mon and Fri    . pantoprazole (PROTONIX) 40 MG tablet Take 1 tablet (40 mg total) by mouth daily. 30 tablet 0  . potassium chloride (MICRO-K) 10 MEQ CR capsule Take 10 mEq by mouth 2 (two) times daily.    . promethazine (PHENERGAN) 25 MG tablet Take 6.25 mg by mouth daily as needed for nausea or vomiting.     . rifaximin (XIFAXAN) 550 MG TABS tablet Take 1 tablet (550 mg total) by mouth 2 (two) times daily. 60  tablet 1  . spironolactone (ALDACTONE) 25 MG tablet Take 25 mg by mouth daily.     . tamsulosin (FLOMAX) 0.4 MG CAPS capsule Take 0.4 mg by mouth.    . torsemide (DEMADEX) 100 MG tablet Take 100 mg by mouth daily as needed (fluid).      No current facility-administered medications for this encounter.    No Known Allergies    Social History   Social History  . Marital Status: Married    Spouse Name: N/A  . Number of Children: 1  . Years of Education: N/A   Occupational History  . retired    Social History Main Topics  . Smoking status: Never Smoker   . Smokeless tobacco: Never Used  . Alcohol Use: No  . Drug Use: No  . Sexual Activity: Yes   Other Topics Concern  . Not on file   Social History Narrative   Married 1 son   Retired Therapist, art   One caffeinated beverage a day no alcohol   He was living in [employer] at the [place] [address] met in [place] and has located to [place] recently.   08/09/2015         Family History  Problem Relation Age of Onset  . Other Mother     HHT  . Other Son     HHT  . Heart failure Father     Filed Vitals:   09/02/15 1013  BP: 100/52  Pulse: 106  Weight: 177 lb 1.9 oz (80.341 kg)  SpO2: 98%    PHYSICAL EXAM: General: Elderly, Chronically ill, and fatigued. HEENT: Diffuse telengectasias.  Neck: supple. JVP 7-8 . Carotids 2+ bilat; no bruits. No lymphadenopathy or thryomegaly. Cor: PMI nondisplaced. Regular rate & rhythm. II/VI SEM at LUSB and apex Lungs: Diminished bases L>R Abdomen: soft, NT, mild distention. No HSM appreciated. No bruits or masses. +BS Extremities: no cyanosis, clubbing, rash. 2-3+ LE edema to mid calf Neuro: alert & orientedx3, cranial nerves grossly intact. moves all 4 extremities w/o difficulty. Affect flat.    ASSESSMENT & PLAN:  1. Chronic diastolic CHF Echo 05/07/40 45-50%.  2. Pulmonary hypertension on Echo 08/11/15 PA peak pressure 51 mm Hg 3. Liver  failure / cirrhosis 4. CKD Stage III 5. Chronic Anemia 6. Hereditary telangiectasia. 7. Hepatic encephalopathy  Volume overloaded on exam with leg swelling.    Increase Torsemide to 80 mg daily and spiro to 50 mg daily Use metolazone 2.5 mg as needed for swelling (will continue twice a week for now) Order compression stockings and stocking donner  BMET/BNP today, and will have [facility] draw weekly BMET  Barrington Ellison, PA-C 09/02/2015   Patient seen and examined with Oda Kilts, PA-C. We  discussed all aspects of the encounter. I agree with the assessment and plan as stated above.   Doing well but still with significant LE edema. Change torsemide to 49m daily and spiro to 557mdaily. Use metolazone as needed (they will continue 2x/week for now and adjust as they go). We have ordered compression stockings.   Total time spent 40 minutes. Over half that time spent discussing above.   Bensimhon, Daniel,MD 4:44 PM

## 2015-09-02 NOTE — Telephone Encounter (Signed)
Critical labs K- 2.6 Per Vo Dr.Bensimhon Pt should take total 120 MEQ today (40, 40, 40) Restart KCL at 20 MEQ BID and recheck labs x 1 week   Left detailed message on phone for patient to return call regarding abnormal labs

## 2015-09-03 MED ORDER — POTASSIUM CHLORIDE ER 10 MEQ PO CPCR
20.0000 meq | ORAL_CAPSULE | Freq: Two times a day (BID) | ORAL | Status: DC
Start: 2015-09-03 — End: 2015-09-15

## 2015-09-03 NOTE — Telephone Encounter (Signed)
Hunter [name], [place] spoke w/pt 9/22 at 5 pm regarding instructions, rx was sent in and pt is sch for weekly bmets with [facility]

## 2015-09-07 ENCOUNTER — Encounter (HOSPITAL_COMMUNITY): Payer: Self-pay | Admitting: *Deleted

## 2015-09-14 ENCOUNTER — Telehealth (HOSPITAL_COMMUNITY): Payer: Self-pay | Admitting: Cardiology

## 2015-09-14 NOTE — Telephone Encounter (Signed)
VERBAL ORDERS GIVEN TO CONTINUE TO SEE PATIENT FOR HH PT

## 2015-09-15 ENCOUNTER — Ambulatory Visit (HOSPITAL_COMMUNITY)
Admission: RE | Admit: 2015-09-15 | Discharge: 2015-09-15 | Disposition: A | Discharge status: Home / Self Care | Payer: Medicare Other | Source: Ambulatory Visit | Attending: Internal Medicine | Admitting: Internal Medicine

## 2015-09-15 ENCOUNTER — Encounter (HOSPITAL_COMMUNITY): Payer: Self-pay | Admitting: Internal Medicine

## 2015-09-15 VITALS — BP 106/56 | HR 88 | Wt 175.8 lb

## 2015-09-15 DIAGNOSIS — D649 Anemia, unspecified: Secondary | ICD-10-CM | POA: Diagnosis not present

## 2015-09-15 DIAGNOSIS — I780 Hereditary hemorrhagic telangiectasia: Secondary | ICD-10-CM | POA: Insufficient documentation

## 2015-09-15 DIAGNOSIS — Z79899 Other long term (current) drug therapy: Secondary | ICD-10-CM | POA: Insufficient documentation

## 2015-09-15 DIAGNOSIS — I272 Other secondary pulmonary hypertension: Secondary | ICD-10-CM | POA: Insufficient documentation

## 2015-09-15 DIAGNOSIS — Z950 Presence of cardiac pacemaker: Secondary | ICD-10-CM | POA: Insufficient documentation

## 2015-09-15 DIAGNOSIS — N183 Chronic kidney disease, stage 3 (moderate): Secondary | ICD-10-CM | POA: Diagnosis not present

## 2015-09-15 DIAGNOSIS — K7460 Unspecified cirrhosis of liver: Secondary | ICD-10-CM | POA: Insufficient documentation

## 2015-09-15 DIAGNOSIS — I5032 Chronic diastolic (congestive) heart failure: Secondary | ICD-10-CM | POA: Diagnosis not present

## 2015-09-15 DIAGNOSIS — I509 Heart failure, unspecified: Secondary | ICD-10-CM | POA: Diagnosis not present

## 2015-09-15 DIAGNOSIS — K7290 Hepatic failure, unspecified without coma: Secondary | ICD-10-CM | POA: Insufficient documentation

## 2015-09-15 DIAGNOSIS — R278 Other lack of coordination: Secondary | ICD-10-CM | POA: Insufficient documentation

## 2015-09-15 DIAGNOSIS — K7682 Hepatic encephalopathy: Secondary | ICD-10-CM

## 2015-09-15 DIAGNOSIS — I50810 Right heart failure, unspecified: Secondary | ICD-10-CM

## 2015-09-15 LAB — CBC
HCT: 27.7 % — ABNORMAL LOW (ref 39.0–52.0)
Hemoglobin: 8.3 g/dL — ABNORMAL LOW (ref 13.0–17.0)
MCH: 27.2 pg (ref 26.0–34.0)
MCHC: 30 g/dL (ref 30.0–36.0)
MCV: 90.8 fL (ref 78.0–100.0)
PLATELETS: 59 10*3/uL — AB (ref 150–400)
RBC: 3.05 MIL/uL — AB (ref 4.22–5.81)
RDW: 17.7 % — ABNORMAL HIGH (ref 11.5–15.5)
WBC: 3.1 10*3/uL — AB (ref 4.0–10.5)

## 2015-09-15 LAB — COMPREHENSIVE METABOLIC PANEL
ALT: 22 U/L (ref 17–63)
AST: 40 U/L (ref 15–41)
Albumin: 2.9 g/dL — ABNORMAL LOW (ref 3.5–5.0)
Alkaline Phosphatase: 161 U/L — ABNORMAL HIGH (ref 38–126)
Anion gap: 8 (ref 5–15)
BUN: 26 mg/dL — AB (ref 6–20)
CHLORIDE: 101 mmol/L (ref 101–111)
CO2: 27 mmol/L (ref 22–32)
Calcium: 8.7 mg/dL — ABNORMAL LOW (ref 8.9–10.3)
Creatinine, Ser: 1.77 mg/dL — ABNORMAL HIGH (ref 0.61–1.24)
GFR calc Af Amer: 41 mL/min — ABNORMAL LOW (ref >60)
GFR, EST NON AFRICAN AMERICAN: 35 mL/min — AB (ref >60)
Glucose, Bld: 94 mg/dL (ref 65–99)
POTASSIUM: 3.2 mmol/L — AB (ref 3.5–5.1)
Sodium: 136 mmol/L (ref 135–145)
Total Bilirubin: 2.8 mg/dL — ABNORMAL HIGH (ref 0.3–1.2)
Total Protein: 5.5 g/dL — ABNORMAL LOW (ref 6.5–8.1)

## 2015-09-15 LAB — AMMONIA: AMMONIA: 81 umol/L — AB (ref 9–35)

## 2015-09-15 MED ORDER — TORSEMIDE 20 MG PO TABS: ORAL_TABLET | ORAL | Status: DC

## 2015-09-15 MED ORDER — PANTOPRAZOLE SODIUM 40 MG PO TBEC: 40.0000 mg | DELAYED_RELEASE_TABLET | Freq: Every day | ORAL | Status: DC

## 2015-09-15 MED ORDER — POTASSIUM CHLORIDE CRYS ER 20 MEQ PO TBCR: EXTENDED_RELEASE_TABLET | ORAL | Status: DC

## 2015-09-15 NOTE — Patient Instructions (Addendum)
Increase Torsemide 80mg  (4 tablets) in the am and 40 mg (2 tablets) in the evening  Increase Potassium 40 meq (2 tablets) in the am and 20 meq (1 tablet) in the evening  Labs today will call if abnormal  Follow up in 3 weeks with Bensimhon  Do the following things EVERYDAY: 1. Weigh yourself in the morning before breakfast. Write it down and keep it in a log. 2. Take your medicines as prescribed 3. Eat low salt foods-Limit salt (sodium) to 2000 mg per day.  4. Stay as active as you can everyday 5. Limit all fluids for the day to less than 2 liters

## 2015-09-15 NOTE — Progress Notes (Signed)
Advanced Heart Failure Clinic Note   Patient ID: Hunter [name], male   DOB: 1935-05-31, 80 y.o.   MRN: [id number]  Primary Cardiologist: Dr. Claude Manges, in [place], had also been acting as PCP Primary HF: Dr. Haroldine Laws   HPI:  Hunter [name] is a 80 y.o. male with a history of diastolic HF Echo 2/91/91 EF 45-50% NYHA class III, pulmonary hypertension, liver failure, CKD stage III, and hereditary hemorrhagic telangiectasia with hepatic AVMs and associated anemia requiring transfusion.[name]  He was admitted to [facility] on 8/30 after labs from [place] on 8/29 showed marked anemia with Hemoglobin of 5 and hyponatremia.  They could not reach Hunter [name] by telephone until the morning of 8/30 and he reported to [facility] ED. He reported worsening fatigue and weakness. He has DOE at baseline. He is normally hypotensive with SBPs in the 90-100 range. Pertinent admission labs include K 4.5, Creatinine 2.5, BNP 188.0, and Hgb 5.7.  He had been taking 100 mg of Torsemide as needed for swelling, and would use it a couple times a week. He was determined to be stable from a HF stand point. Weight on discharge 177 lbs.  He presents today for regular follow up.  At last visit we increased torsemide to 64m daily and spiro to 566mdaily.  Has been doing great. Down 2 lbs.  Legs still swollen but near baseline. Only needed metolazone one day, with some swelling.  Weights at home stable around 172. Appetite continues to improve. No SOB, energy and tolerance for activity continues to improve. Finished OT today. PT still working with balance. He denies Orthopnea/PND/CP. Has had no problem off of flowmax. Legs better with compression stockings.   Echo 08/11/15 45-50 %. RV normal. Marked biatrial enlargement with probable restrictive filling pattern. Moderate to severe MR. Moderate TR. PA peak pressure 51 mm Hg.  [place] EF 50-55%, Grade 2 DD, RV severely dilated Echo 3/15 EF 55-60%, mod enlarged  RV, RV systolic function low normal. Increased LV filling pressures, Moderate MR  RHC 01/19/15 RA 24/18, (20) RV 45/20 PA 45/30 (35) PCW systole 35 mean 30  RHC 4/6/6/06A systolic 24, mean 18 RV 5000/45A 50/25 (34) PCW 28 mean    Past Medical History  Diagnosis Date  . CHF (congestive heart failure) (HCBennett  . Pacemaker     single lead Medtronic pacemaker  . HHT (hereditary hemorrhagic telangiectasia) (HCDuson  . Anemia   . Pulmonary hypertension (HCGrapevine  . Osler-Weber-Rendu syndrome (HCAlexander  . Renal insufficiency   . Liver failure (HCOcean Park  . Atrial fibrillation (HCEdna Bay  . Hepatic AV fistula (HCHenderson8/29/2016  . 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 Anemia due to chronic blood loss 08/09/2015  . Right heart failure (HCNassau Bay8/29/2016  . Tricuspid regurgitation 08/09/2015  . Osler-Weber-Rendu syndrome ([facility]    Current Outpatient Prescriptions  Medication Sig Dispense Refill  . b complex-vitamin c-folic acid (NEPHRO-VITE) 0.8 MG TABS tablet Take 1 tablet by mouth daily.    . Cyanocobalamin 1000 MCG/ML KIT Inject 1 Syringe as directed every 30 (thirty) days.    . digoxin (LANOXIN) 0.125 MG tablet Take 1 tablet (0.125 mg total) by mouth 3 (three) times a week. Mon, Wed, Fri 15 tablet 6  . feeding supplement, ENSURE ENLIVE, (ENSURE ENLIVE) LIQD Take 237 mLs by mouth 2 (two) times daily between meals. 237 mL 12  . ferrous sulfate 325 (65 FE) MG EC tablet Take 325 mg  by mouth 2 (two) times daily with a meal.     . lactulose (CHRONULAC) 10 GM/15ML solution Take 45 mLs (30 g total) by mouth 2 (two) times daily. 240 mL 0  . Magnesium 250 MG TABS Take 1 tablet by mouth daily.    . metolazone (ZAROXOLYN) 2.5 MG tablet Take 1 tablet (2.5 mg total) by mouth as needed (for increased swelling). Mon and Fri    . pantoprazole (PROTONIX) 40 MG tablet Take 1 tablet (40 mg total) by mouth daily. 30 tablet 0  . potassium chloride (MICRO-K) 10 MEQ CR capsule Take 2 capsules (20 mEq total) by mouth 2 (two) times  daily. 120 capsule 3  . promethazine (PHENERGAN) 25 MG tablet Take 6.25 mg by mouth daily as needed for nausea or vomiting.     . rifaximin (XIFAXAN) 550 MG TABS tablet Take 1 tablet (550 mg total) by mouth 2 (two) times daily. 60 tablet 1  . spironolactone (ALDACTONE) 50 MG tablet Take 1 tablet (50 mg total) by mouth daily. 30 tablet 3  . tamsulosin (FLOMAX) 0.4 MG CAPS capsule Take 0.4 mg by mouth.    . torsemide (DEMADEX) 20 MG tablet Take 4 tablets (80 mg total) by mouth daily. 120 tablet 3   No current facility-administered medications for this encounter.    No Known Allergies    Social History   Social History  . Marital Status: Married    Spouse Name: N/A  . Number of Children: 1  . Years of Education: N/A   Occupational History  . retired    Social History Main Topics  . Smoking status: Never Smoker   . Smokeless tobacco: Never Used  . Alcohol Use: No  . Drug Use: No  . Sexual Activity: Yes   Other Topics Concern  . Not on file   Social History Narrative   Married 1 son   Retired Therapist, art   One caffeinated beverage a day no alcohol   He was living in [employer] at the [place] on [address] met in [place] and has located to [place] recently.   08/09/2015         Family History  Problem Relation Age of Onset  . Other Mother     HHT  . Other Son     HHT  . Heart failure Father     Filed Vitals:   09/15/15 1438  BP: 106/56  Pulse: 88  Weight: 175 lb 12 oz (79.72 kg)  SpO2: 99%    PHYSICAL EXAM: General: Elderly, Chronically ill, and fatigued. HEENT: Diffuse telengectasias.  Neck: supple. JVP 9-10 . Carotids 2+ bilat; no bruits. No lymphadenopathy or thryomegaly. Cor: PMI nondisplaced. Regular rate & rhythm. II/VI SEM at LUSB and apex Lungs: Diminished bases L>R Abdomen: soft, NT, mild distention. No HSM appreciated. No bruits or masses. +BS Extremities: no cyanosis, clubbing, rash. 2+ LE edema to thigh. Mild  asterixis Neuro: alert & orientedx3, cranial nerves grossly intact. moves all 4 extremities w/o difficulty. Affect flat.    ASSESSMENT & PLAN:  1. Chronic diastolic CHF Echo 0/35/00 45-50%.  2. Pulmonary hypertension on Echo 08/11/15 PA peak pressure 51 mm Hg 3. Liver failure / cirrhosis 4. CKD Stage III 5. Chronic Anemia 6. Hereditary telangiectasia. 7. Hepatic encephalopathy  Volume overloaded on exam.  Increase torsemide to 80 mg qam and 40 mg qpm.  Increase K to 40 meq qam and 20 meq qpm.   Mild asterixis on exam.  Will  check Ammonia. Encouraged to give lactulose TID.  Needs to be set up with PCP. Dr. Haroldine Laws recommends Dr Dimple Nanas.  Labs via [facility] weekly. Check CMET, Ammonia, and CBC today.  Barrington Ellison, PA-C 09/15/2015   Patient seen and examined with Oda Kilts, PA-C. We discussed all aspects of the encounter. I agree with the assessment and plan as stated above.   Volume status remains elevated. Will increase torsemide to 80/40. Increase K to 40/20. Check labs next week. Also appears mildly encephalopathic and discussed him taking extra lactulose as needed. Check labs today.   Shaney Deckman,MD 6:19 PM

## 2015-09-17 ENCOUNTER — Other Ambulatory Visit (HOSPITAL_COMMUNITY): Payer: Self-pay | Admitting: Internal Medicine

## 2015-09-20 ENCOUNTER — Other Ambulatory Visit (INDEPENDENT_AMBULATORY_CARE_PROVIDER_SITE_OTHER): Payer: Medicare Other

## 2015-09-20 ENCOUNTER — Ambulatory Visit (INDEPENDENT_AMBULATORY_CARE_PROVIDER_SITE_OTHER): Payer: Medicare Other | Admitting: Internal Medicine

## 2015-09-20 ENCOUNTER — Encounter: Payer: Self-pay | Admitting: Internal Medicine

## 2015-09-20 VITALS — BP 100/50 | HR 88 | Ht 71.0 in | Wt 175.5 lb

## 2015-09-20 DIAGNOSIS — K7290 Hepatic failure, unspecified without coma: Secondary | ICD-10-CM

## 2015-09-20 DIAGNOSIS — I780 Hereditary hemorrhagic telangiectasia: Secondary | ICD-10-CM

## 2015-09-20 DIAGNOSIS — K744 Secondary biliary cirrhosis: Secondary | ICD-10-CM

## 2015-09-20 DIAGNOSIS — K7682 Hepatic encephalopathy: Secondary | ICD-10-CM

## 2015-09-20 DIAGNOSIS — D500 Iron deficiency anemia secondary to blood loss (chronic): Secondary | ICD-10-CM

## 2015-09-20 LAB — CBC WITH DIFFERENTIAL/PLATELET
BASOS ABS: 0 10*3/uL (ref 0.0–0.1)
Basophils Relative: 0.3 % (ref 0.0–3.0)
EOS ABS: 0 10*3/uL (ref 0.0–0.7)
Eosinophils Relative: 0.9 % (ref 0.0–5.0)
HCT: 25.1 % — ABNORMAL LOW (ref 39.0–52.0)
Hemoglobin: 8.1 g/dL — ABNORMAL LOW (ref 13.0–17.0)
LYMPHS ABS: 0.4 10*3/uL — AB (ref 0.7–4.0)
LYMPHS PCT: 9.7 % — AB (ref 12.0–46.0)
MCHC: 32.5 g/dL (ref 30.0–36.0)
MCV: 87.3 fl (ref 78.0–100.0)
MONOS PCT: 15.1 % — AB (ref 3.0–12.0)
Monocytes Absolute: 0.6 10*3/uL (ref 0.1–1.0)
NEUTROS ABS: 3 10*3/uL (ref 1.4–7.7)
NEUTROS PCT: 74 % (ref 43.0–77.0)
PLATELETS: 63 10*3/uL — AB (ref 150.0–400.0)
RBC: 2.88 Mil/uL — AB (ref 4.22–5.81)
RDW: 18.2 % — ABNORMAL HIGH (ref 11.5–15.5)
WBC: 4 10*3/uL (ref 4.0–10.5)

## 2015-09-20 LAB — FERRITIN: Ferritin: 23.3 ng/mL (ref 22.0–322.0)

## 2015-09-20 MED ORDER — ONDANSETRON HCL 4 MG PO TABS: 4.0000 mg | ORAL_TABLET | Freq: Three times a day (TID) | ORAL | Status: DC | PRN

## 2015-09-20 NOTE — Assessment & Plan Note (Signed)
Stable discused titration of lactulose

## 2015-09-20 NOTE — Assessment & Plan Note (Signed)
Cbc today

## 2015-09-20 NOTE — Progress Notes (Signed)
Subjective:    Patient ID: Hunter [name], male    DOB: 1935-08-09, 80 y.o.   MRN: [id number] He is complaint: Cirrhosis liver failure follow-up, chronic blood loss anemia from hereditary telangiectasia HPI The patient presents with his wife. He is improved since he was hospitalized in late August and early September. He was transfused, he had an AVM ablated by my partner Dr. Fuller Plan. He is been holding his hemoglobin better and is stronger. He takes some naps, there is been some transient confusion but no overt or worsening encephalopathy per his wife. He is undergoing occupational therapy and physical therapy and is going to see Dr. Charlett Blake for primary care. The heart failure team with Dr. Haroldine Laws is adjusting his diuretics and cardiac treatment with success. He did have a single episode of epistaxis the day before it visit today but overall has been having less of that. He is having some nausea at times, promethazine makes him sleepy though she reduces the dose and that seems to help. Dr. Haroldine Laws is advised him some extra lactulose at times he remains on Xifaxan as well. Medications, allergies, past medical history, past surgical history, family history and social history are reviewed and updated in the EMR.   Review of Systems As per history of present illness he ambulates using a walker or cane and is improving in this area with therapy.    Objective:   Physical Exam '@BP'  100/50 mmHg  Pulse 88  Ht '5\' 11"'  (1.803 m)  Wt 175 lb 8 oz (79.606 kg)  BMI 24.49 kg/m2@  General:  Chronically ill but stronger than last visit Eyes:  anicteric. ENT:   telangiectasia Lungs: Clear to auscultation bilaterally. Heart:  Regular rate & rhythm. II/VI SEM at LUSB and apex + JVD Abdomen:  soft, non-tender, mildly distended + hepatic bruit, BS+.  Lymph:  no cervical or supraclavicular adenopathy. Extremities:   2+ pretib edema Skin   telangectasia Neuro:  A&O x 3. ? Slight asterixis Psych:  appropriate  mood and  Affect.   Data Reviewed:   Heart failure clinic notes, labs include creatinine 1.77 on October 5 of BUN 26 albumin 2.9 and normal transaminases out [place] 161 and bilirubin 2.8. Hemoglobin 8.3 which is relatively stable and per his wife good for him.      Assessment & Plan:   1. Secondary biliary cirrhosis ([place])   2. Anemia due to chronic blood loss   3. Hepatic encephalopathy ([place])   4. HHT (hereditary hemorrhagic telangiectasia) ([place])    Though he remains seriously chronically ill he is significantly improved from when I met him initially.  I have rechecked CBC today that it is with hemoglobin 8.1. This is stable. Ferritin is barely normal at 23.  He will continue his current regimen and should have serial hemoglobins, I will recheck those in 2 weeks including a ferritin. If his ferritin drops in his hemoglobin is stable we could make a case for ferriheme infusion I think. He certainly may need transfusion support as well. I broached the idea of using a hematologist but the wife is appropriately concerned about too many doctor visits, I think at this point either Dr. Haroldine Laws her I could manage and transfusions if needed. Dr. Charlett Blake could help with that as well perhaps. He will remain on oral ferrous sulfate.  Guarding his hepatic encephalopathy he looks okay today though I suspect there is very low-grade encephalopathy. I will follow him clinically more than the numbers with the ammonia. He  is having a few soft stools a day, no diarrhea them aware of his wife can add extra lactulose when necessary. She seems to grasp how to do this.  I have prescribed ondansetron for the nausea which should not sedate him hopefully.  He will see me again in the on December 5. Again I'm checking a CBC and ferritin in 2 weeks. I will help follow.  I appreciate the opportunity to care for this patient.  I will send a copy to Dr. Haroldine Laws

## 2015-09-20 NOTE — Assessment & Plan Note (Signed)
1 epistaxis since hospitalization/last visit

## 2015-09-20 NOTE — Assessment & Plan Note (Signed)
improved

## 2015-09-20 NOTE — Patient Instructions (Addendum)
   Your physician has requested that you go to the basement for the following lab work before leaving today: CBC/diff, Ferritin    Follow up with Dr Carlean Purl in 2 months.  We have sent the following medications to your pharmacy for you to pick up at your convenience: Generic zofran  I appreciate the opportunity to care for you. Silvano Rusk, MD, [facility]

## 2015-09-21 ENCOUNTER — Other Ambulatory Visit: Payer: Self-pay

## 2015-09-21 DIAGNOSIS — D509 Iron deficiency anemia, unspecified: Secondary | ICD-10-CM

## 2015-09-21 NOTE — Progress Notes (Signed)
Quick Note:  Hgb 8.1 - not bad for him Repeat a CBC and ferritin in 2 weeks please dx anemia of chronic blood loss  ______

## 2015-09-22 ENCOUNTER — Encounter: Payer: Self-pay | Admitting: Internal Medicine

## 2015-10-07 ENCOUNTER — Ambulatory Visit (HOSPITAL_COMMUNITY)
Admission: RE | Admit: 2015-10-07 | Discharge: 2015-10-07 | Disposition: A | Discharge status: Home / Self Care | Payer: Medicare Other | Source: Ambulatory Visit | Attending: Internal Medicine | Admitting: Internal Medicine

## 2015-10-07 ENCOUNTER — Telehealth: Payer: Self-pay | Admitting: Family Medicine

## 2015-10-07 ENCOUNTER — Encounter (HOSPITAL_COMMUNITY): Payer: Self-pay | Admitting: Internal Medicine

## 2015-10-07 VITALS — BP 106/58 | HR 95 | Wt 177.4 lb

## 2015-10-07 DIAGNOSIS — K7460 Unspecified cirrhosis of liver: Secondary | ICD-10-CM | POA: Insufficient documentation

## 2015-10-07 DIAGNOSIS — N183 Chronic kidney disease, stage 3 (moderate): Secondary | ICD-10-CM | POA: Insufficient documentation

## 2015-10-07 DIAGNOSIS — Z950 Presence of cardiac pacemaker: Secondary | ICD-10-CM | POA: Diagnosis not present

## 2015-10-07 DIAGNOSIS — Z79899 Other long term (current) drug therapy: Secondary | ICD-10-CM | POA: Diagnosis not present

## 2015-10-07 DIAGNOSIS — I780 Hereditary hemorrhagic telangiectasia: Secondary | ICD-10-CM | POA: Diagnosis not present

## 2015-10-07 DIAGNOSIS — I5032 Chronic diastolic (congestive) heart failure: Secondary | ICD-10-CM

## 2015-10-07 DIAGNOSIS — K7290 Hepatic failure, unspecified without coma: Secondary | ICD-10-CM

## 2015-10-07 DIAGNOSIS — D649 Anemia, unspecified: Secondary | ICD-10-CM | POA: Diagnosis not present

## 2015-10-07 DIAGNOSIS — I272 Other secondary pulmonary hypertension: Secondary | ICD-10-CM | POA: Insufficient documentation

## 2015-10-07 DIAGNOSIS — I071 Rheumatic tricuspid insufficiency: Secondary | ICD-10-CM | POA: Diagnosis not present

## 2015-10-07 DIAGNOSIS — K7682 Hepatic encephalopathy: Secondary | ICD-10-CM

## 2015-10-07 LAB — BASIC METABOLIC PANEL
Anion gap: 7 (ref 5–15)
BUN: 31 mg/dL — ABNORMAL HIGH (ref 6–20)
CHLORIDE: 100 mmol/L — AB (ref 101–111)
CO2: 28 mmol/L (ref 22–32)
CREATININE: 1.93 mg/dL — AB (ref 0.61–1.24)
Calcium: 8.7 mg/dL — ABNORMAL LOW (ref 8.9–10.3)
GFR, EST AFRICAN AMERICAN: 37 mL/min — AB (ref >60)
GFR, EST NON AFRICAN AMERICAN: 32 mL/min — AB (ref >60)
Glucose, Bld: 138 mg/dL — ABNORMAL HIGH (ref 65–99)
Potassium: 4.5 mmol/L (ref 3.5–5.1)
SODIUM: 135 mmol/L (ref 135–145)

## 2015-10-07 LAB — CBC
HCT: 24.2 % — ABNORMAL LOW (ref 39.0–52.0)
HEMOGLOBIN: 7.5 g/dL — AB (ref 13.0–17.0)
MCH: 28 pg (ref 26.0–34.0)
MCHC: 31 g/dL (ref 30.0–36.0)
MCV: 90.3 fL (ref 78.0–100.0)
PLATELETS: 64 10*3/uL — AB (ref 150–400)
RBC: 2.68 MIL/uL — AB (ref 4.22–5.81)
RDW: 17.2 % — ABNORMAL HIGH (ref 11.5–15.5)
WBC: 3.6 10*3/uL — ABNORMAL LOW (ref 4.0–10.5)

## 2015-10-07 LAB — DIGOXIN LEVEL

## 2015-10-07 LAB — FERRITIN: FERRITIN: 19 ng/mL — AB (ref 24–336)

## 2015-10-07 NOTE — Telephone Encounter (Signed)
Caller name: Dr. Suezanne Jacquet   Relationship to patient: Referring provider   Reason for call: He says due to pt's medical history he would need to be scheduled sooner than March. Found np appt on schedule on 11/7 at 2:30. LVM for pt to confirm new appt date and time.        Thanks

## 2015-10-07 NOTE — Patient Instructions (Signed)
Labs today  Your physician recommends that you schedule a follow-up appointment in: 6 weeks  Do the following things EVERYDAY: 1) Weigh yourself in the morning before breakfast. Write it down and keep it in a log. 2) Take your medicines as prescribed 3) Eat low salt foods-Limit salt (sodium) to 2000 mg per day.  4) Stay as active as you can everyday 5) Limit all fluids for the day to less than 2 liters 6)   

## 2015-10-07 NOTE — Telephone Encounter (Signed)
Pt has been scheduled.  °

## 2015-10-07 NOTE — Addendum Note (Signed)
Encounter addended by: Kerry Dory, CMA on: 10/07/2015 11:12 AM<BR>     Documentation filed: Dx Association, Patient Instructions Section, Orders

## 2015-10-07 NOTE — Progress Notes (Signed)
Advanced Heart Failure Clinic Note   Patient ID: Hunter [name], male   DOB: 11-12-1935, 80 y.o.   MRN: [id number]  Primary Cardiologist: Dr. Claude Manges, in [place], had also been acting as PCP Primary HF: Dr. Haroldine Laws   HPI:  Hunter [name] is a 80 y.o. male with a history of diastolic HF Echo 03/19/80 EF 45-50% NYHA class III, pulmonary hypertension, liver failure, CKD stage III, and hereditary hemorrhagic telangiectasia with hepatic AVMs and associated anemia requiring transfusion.[name]  He was admitted to [facility] on 8/30 after labs from [place] on 8/29 showed marked anemia with Hemoglobin of 5 and hyponatremia. He was transfused and also diuresed. EGD showed AVMs which were treated.  Weight on discharge 177 lbs.  He presents today for regular follow up.  At last visit we increased torsemide to 80/40 and increased his K to 79mQ qAM. Continuing with spiro to 5101mdaily. He has required 1 dose of metolazone on 10/16 when his weight was up to 173. His average weight is 169-172. Legs still swollen but near baseline.  Appetite continues to improve. No SOB, energy and tolerance for activity continues to improve. Still working with PT and able to walk down the street.  He denies Orthopnea/PND/CP. Has had no problem off of flomax.   Currently has no PCP, he [facility]  Echo 08/11/15 45-50 %. RV normal. Marked biatrial enlargement with probable restrictive filling pattern. Moderate to severe MR. Moderate TR. PA peak pressure 51 mm Hg.  Labs: 09/15/15: Creatinine 1.77, K 3.2   [facility] EF 50-55%, Grade 2 DD, RV severely dilated Echo 3/15 EF 55-60%, mod enlarged RV, RV systolic function low normal. Increased LV filling pressures, Moderate MR  RHC 01/19/15 RA 24/18, (20) RV 45/20 PA 45/30 (35) PCW  mean 30    Past Medical History  Diagnosis Date  . CHF (congestive heart failure) (HCNuevo  .  Pacemaker     single lead Medtronic pacemaker  . HHT (hereditary hemorrhagic telangiectasia) (HCLaSalle  . Anemia   . Pulmonary hypertension (HCVanceburg  . Osler-Weber-Rendu syndrome (HCBloomfield  . Renal insufficiency   . Liver failure (HCPurdin  . Atrial fibrillation (HCBlue Springs  . Hepatic AV fistula (HCEast Berlin8/29/2016  . 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 Anemia due to chronic blood loss 08/09/2015  . Right heart failure (HCSunnyside8/29/2016  . Tricuspid regurgitation 08/09/2015  . Osler-Weber-Rendu syndrome (H[facility]    Current Outpatient Prescriptions  Medication Sig Dispense Refill  . b complex-vitamin c-folic acid (NEPHRO-VITE) 0.8 MG TABS tablet Take 1 tablet by mouth daily.    . Cyanocobalamin 1000 MCG/ML KIT Inject 1 Syringe as directed every 30 (thirty) days.    . digoxin (LANOXIN) 0.125 MG tablet Take 1 tablet (0.125 mg total) by mouth 3 (three) times a week. Mon, Wed, Fri 15 tablet 6  . feeding supplement, ENSURE ENLIVE, (ENSURE ENLIVE) LIQD Take 237 mLs by mouth 2 (two) times daily between meals. 237 mL 12  . ferrous sulfate 325 (65 FE) MG EC tablet Take 325 mg by mouth 2 (two) times daily with a meal.     . lactulose (CHRONULAC) 10 GM/15ML solution Take 45 mLs (30 g total) by mouth 2 (two) times daily. 240 mL 0  . Magnesium 250 MG TABS Take 1 tablet by mouth daily.    . metolazone (ZAROXOLYN) 2.5 MG tablet Take 2.5 mg  by mouth as needed (for weight gain).    . ondansetron (ZOFRAN) 4 MG tablet Take 1 tablet (4 mg total) by mouth every 8 (eight) hours as needed for nausea or vomiting. 30 tablet 5  . pantoprazole (PROTONIX) 40 MG tablet Take 1 tablet (40 mg total) by mouth daily. 30 tablet 6  . potassium chloride SA (K-DUR,KLOR-CON) 20 MEQ tablet Take 40 meq (2 tablets) in the am and 20 meq (1 tablet) in the evening 90 tablet 3  . promethazine (PHENERGAN) 25 MG tablet Take 6.25 mg by mouth daily as needed for nausea or vomiting.     . rifaximin (XIFAXAN) 550 MG TABS tablet Take 1 tablet (550 mg total) by mouth 2  (two) times daily. 60 tablet 1  . spironolactone (ALDACTONE) 50 MG tablet Take 1 tablet (50 mg total) by mouth daily. 30 tablet 3  . tamsulosin (FLOMAX) 0.4 MG CAPS capsule Take 0.4 mg by mouth.    . torsemide (DEMADEX) 20 MG tablet Take 80 mg (4 tablets) in the am and 40 mg (2 tablets) in the evening 180 tablet 3   No current facility-administered medications for this encounter.    No Known Allergies    Social History   Social History  . Marital Status: Married    Spouse Name: N/A  . Number of Children: 1  . Years of Education: N/A   Occupational History  . retired    Social History Main Topics  . Smoking status: Never Smoker   . Smokeless tobacco: Never Used  . Alcohol Use: No  . Drug Use: No  . Sexual Activity: Yes   Other Topics Concern  . 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   08/09/2015         Family History  Problem Relation Age of Onset  . Other Mother     HHT  . Other Son     HHT  . Heart failure Father     Filed Vitals:   10/07/15 1015  BP: 106/58  Pulse: 95  Weight: 177 lb 6.4 oz (80.468 kg)  SpO2: 98%    PHYSICAL EXAM: General: Elderly, Chronically ill, and fatigued. HEENT: Diffuse telengectasias.  Neck: supple. JVP 8 . Carotids 2+ bilat; no bruits. No lymphadenopathy or thryomegaly. Cor: PMI nondisplaced. Regular rate & rhythm. II/VI SEM at LUSB and apex Lungs: Diminished air movement in the bases L>R Abdomen: soft, NT, mild distention. No HSM appreciated. No bruits or masses. +BS Extremities: no cyanosis, clubbing, rash. 1+ LE pitting edema to thigh. Mild asterixis Neuro: alert & orientedx3, cranial nerves grossly intact. moves all 4 extremities w/o difficulty. Affect flat.    ASSESSMENT & PLAN:  1. Chronic  diastolic CHF Echo 01/31/24 45-50%.  2. Pulmonary hypertension on Echo 08/11/15 PA peak pressure 51 mm Hg 3. Liver failure / cirrhosis 4. CKD Stage III 5. Chronic Anemia 6. Hereditary telangiectasia. 7. Hepatic encephalopathy  Volume overloaded on exam: Continue with torsemide 83m qAM and 457mqPM as well as KDUR 4060mqAM and 46m50mPM.   Continue metolazone 2.5mg 84mneeded for increased weight.   Repeat CMET,CBC and dig level today (or whenever the patient is due to have a repeat CBC and ferritin with Dr.  Carlean Purl).  Archie Patten, MD [facility] Resident  10/07/2015, 10:30 AM  Patient seen and examined with Dr. Lorenso Courier. We discussed all aspects of the encounter. I agree with the assessment and plan as stated above.   He is doing very well. His wife has been an excellent caretaker. Minimally fluid overloaded but much improved. Continue current regimen. Can take metolazone as needed to protect weight of 168-170. Still mildly encephalopthic on Xifaxin and lactulose. We discussed limiting protein/meat intake.   Dr. Randel Pigg has graciously accepted him as a new patient to establish PCP care. I am available to help at any time.   Cori Henningsen,MD 11:03 AM

## 2015-10-14 ENCOUNTER — Telehealth (HOSPITAL_COMMUNITY): Payer: Self-pay

## 2015-10-14 NOTE — Telephone Encounter (Signed)
Verbal order for recertiication for twice weekly PT for 6 weeks for balance and strength training to Tye [place], PT with [facility] from Dr. Vaughan Browner

## 2015-10-15 ENCOUNTER — Telehealth: Payer: Self-pay

## 2015-10-15 NOTE — Telephone Encounter (Signed)
Pre-Visit call made. Wife if [employer] wants to call me back. States she will call when she gets home. Advised I will call her as well.

## 2015-10-18 ENCOUNTER — Ambulatory Visit (INDEPENDENT_AMBULATORY_CARE_PROVIDER_SITE_OTHER): Payer: Medicare Other | Admitting: Family Medicine

## 2015-10-18 ENCOUNTER — Encounter: Payer: Self-pay | Admitting: Family Medicine

## 2015-10-18 VITALS — BP 120/72 | HR 98 | Temp 97.8°F | Ht 71.0 in | Wt 178.2 lb

## 2015-10-18 DIAGNOSIS — C439 Malignant melanoma of skin, unspecified: Secondary | ICD-10-CM | POA: Diagnosis not present

## 2015-10-18 DIAGNOSIS — I5032 Chronic diastolic (congestive) heart failure: Secondary | ICD-10-CM

## 2015-10-18 DIAGNOSIS — N189 Chronic kidney disease, unspecified: Secondary | ICD-10-CM | POA: Diagnosis not present

## 2015-10-18 DIAGNOSIS — D649 Anemia, unspecified: Secondary | ICD-10-CM

## 2015-10-18 MED ORDER — NEPHRO-VITE 0.8 MG PO TABS: 1.0000 | ORAL_TABLET | Freq: Every day | ORAL | Status: DC

## 2015-10-18 NOTE — Progress Notes (Signed)
Pre visit review using our clinic review tool, if applicable. No additional management support is needed unless otherwise documented below in the visit note. 

## 2015-10-18 NOTE — Patient Instructions (Signed)

## 2015-10-19 ENCOUNTER — Encounter: Payer: Self-pay | Admitting: Family Medicine

## 2015-10-24 NOTE — Assessment & Plan Note (Addendum)
Likely multifactorial would beneift from referral to hematology for further consideration. Increase leafy greens, consider increased lean red meat and using cast iron cookware. Continue to monitor, report any concerns

## 2015-10-24 NOTE — Progress Notes (Signed)
Subjective:    Patient ID: Hunter [name], male    DOB: [birth year], 80 y.o.   MRN: [id number]  Chief Complaint  Patient presents with  . Establish Care    HPI Patient is in today for new patient appt. He is accompanied by his wife. No recent illness but he is in need a primary care physician.  Due to his complicated cardiac history ESRD established with cardiology. He has a past medical history significant for congestive heart failure, multifactorial anemia, AV malformations, cirrhosis, renal insufficiency as well as several others. He is feeling well today. Denies CP/palp/SOB/HA/congestion/fevers/GI or GU c/o. Taking meds as prescribed Past Medical History  Diagnosis Date  . CHF (congestive heart failure) ([place])   . Pacemaker     single lead Medtronic pacemaker  . HHT (hereditary hemorrhagic telangiectasia) ([place])   . Anemia   . Pulmonary hypertension ([place])   . 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 Renal insufficiency   . Liver failure ([place])   . Atrial fibrillation ([place])   . Hepatic AV fistula ([name]) 08/09/2015  . 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 Anemia due to chronic blood loss 08/09/2015  . Right heart failure ([place]) 08/09/2015  . Tricuspid regurgitation 08/09/2015  . 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 Melanoma ([place])     2 on nose, 1 on back, 1 on leg    Past Surgical History  Procedure Laterality Date  . Pacemaker insertion    . Colon surgery  1196  . Esophagogastroduodenoscopy N/A 08/13/2015    Procedure: ESOPHAGOGASTRODUODENOSCOPY (EGD);  Surgeon: Ladene Artist, MD;  Location: [facility] ENDOSCOPY;  Service: Endoscopy;  Laterality: N/A;  . Tonsillectomy Bilateral     Family History  Problem Relation Age of Onset  . Other Mother     HHT  . Other Son     HHT  . Heart failure Father   . Heart disease Sister     afib, pacer  . Arthritis Brother     b/l TKR  . Other Brother   . Heart disease Sister     pacer    Social History   Social History  . Marital Status: Married      Spouse Name: N/A  . Number of Children: 1  . Years of Education: N/A   Occupational History  . retired    Social History Main Topics  . Smoking status: Never Smoker   . Smokeless tobacco: Never Used  . Alcohol Use: No  . Drug Use: No  . 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 Not on file   Social History Narrative   Married 1 son   Retired Therapist, art   One caffeinated beverage a day no alcohol   He was living in [employer] at the [place] [address] met in [place] and has located to [place] recently.   08/09/2015       Outpatient Prescriptions Prior to Visit  Medication Sig Dispense Refill  . Cyanocobalamin 1000 MCG/ML KIT Inject 1 Syringe as directed every 30 (thirty) days.    . digoxin (LANOXIN) 0.125 MG tablet Take 1 tablet (0.125 mg total) by mouth 3 (three) times a week. Mon, Wed, Fri 15 tablet 6  . feeding supplement, ENSURE ENLIVE, (ENSURE ENLIVE) LIQD Take 237 mLs by mouth 2 (two) times daily between meals. 237 mL 12  .  ferrous sulfate 325 (65 FE) MG EC tablet Take 325 mg by mouth 2 (two) times daily with a meal.     . lactulose (CHRONULAC) 10 GM/15ML solution Take 45 mLs (30 g total) by mouth 2 (two) times daily. 240 mL 0  . Magnesium 250 MG TABS Take 1 tablet by mouth daily.    . metolazone (ZAROXOLYN) 2.5 MG tablet Take 2.5 mg by mouth as needed (for weight gain).    . ondansetron (ZOFRAN) 4 MG tablet Take 1 tablet (4 mg total) by mouth every 8 (eight) hours as needed for nausea or vomiting. 30 tablet 5  . pantoprazole (PROTONIX) 40 MG tablet Take 1 tablet (40 mg total) by mouth daily. 30 tablet 6  . potassium chloride SA (K-DUR,KLOR-CON) 20 MEQ tablet Take 40 meq (2 tablets) in the am and 20 meq (1 tablet) in the evening 90 tablet 3  . promethazine (PHENERGAN) 25 MG tablet Take 6.25 mg by mouth daily as  needed for nausea or vomiting.     . rifaximin (XIFAXAN) 550 MG TABS tablet Take 1 tablet (550 mg total) by mouth 2 (two) times daily. 60 tablet 1  . spironolactone (ALDACTONE) 50 MG tablet Take 1 tablet (50 mg total) by mouth daily. 30 tablet 3  . torsemide (DEMADEX) 20 MG tablet Take 80 mg (4 tablets) in the am and 40 mg (2 tablets) in the evening 180 tablet 3  . b complex-vitamin c-folic acid (NEPHRO-VITE) 0.8 MG TABS tablet Take 1 tablet by mouth daily.    . tamsulosin (FLOMAX) 0.4 MG CAPS capsule Take 0.4 mg by mouth.     No facility-administered medications prior to visit.    No Known Allergies  Review of Systems  Constitutional: Positive for malaise/fatigue. Negative for fever.  HENT: Negative for congestion.   Eyes: Negative for discharge.  Respiratory: Positive for shortness of breath.   Cardiovascular: Positive for leg swelling. Negative for chest pain and palpitations.  Gastrointestinal: Negative for nausea and abdominal pain.  Genitourinary: Negative for dysuria.  Musculoskeletal: Negative for falls.  Skin: Negative for rash.  Neurological: Negative for loss of consciousness and headaches.  Endo/Heme/Allergies: Negative for environmental allergies.  Psychiatric/Behavioral: Negative for depression. The patient is not nervous/anxious.        Objective:    Physical Exam  Constitutional: He is oriented to person, place, and time. He appears well-developed and well-nourished. No distress.  HENT:  Head: Normocephalic and atraumatic.  Eyes: Conjunctivae are normal.  Neck: Neck supple. No thyromegaly present.  Cardiovascular: Normal rate, regular rhythm and normal heart sounds.   Pulmonary/Chest: Effort normal and breath sounds normal. No respiratory distress. He has no wheezes.  Abdominal: Soft. Bowel sounds are normal. He exhibits no mass. There is no tenderness.  Musculoskeletal: He exhibits no edema.  Lymphadenopathy:    He has no cervical adenopathy.  Neurological:  He is alert and oriented to person, place, and time.  Skin: Skin is warm and dry.  Psychiatric: He has a normal mood and affect. His behavior is normal.    BP 120/72 mmHg  Pulse 98  Temp(Src) 97.8 F (36.6 C) (Oral)  Ht '5\' 11"'  (1.803 m)  Wt 178 lb 4 oz (80.854 kg)  BMI 24.87 kg/m2  SpO2 99% Wt Readings from Last 3 Encounters:  10/18/15 178 lb 4 oz (80.854 kg)  10/07/15 177 lb 6.4 oz (80.468 kg)  09/20/15 175 lb 8 oz (79.606 kg)     Lab Results  Component Value Date  WBC 3.6* 10/07/2015   HGB 7.5* 10/07/2015   HCT 24.2* 10/07/2015   PLT 64* 10/07/2015   GLUCOSE 138* 10/07/2015   ALT 22 09/15/2015   AST 40 09/15/2015   NA 135 10/07/2015   K 4.5 10/07/2015   CL 100* 10/07/2015   CREATININE 1.93* 10/07/2015   BUN 31* 10/07/2015   CO2 28 10/07/2015   TSH 5.49* 08/09/2015   INR 1.27 08/12/2015    Lab Results  Component Value Date   TSH 5.49* 08/09/2015   Lab Results  Component Value Date   WBC 3.6* 10/07/2015   HGB 7.5* 10/07/2015   HCT 24.2* 10/07/2015   MCV 90.3 10/07/2015   PLT 64* 10/07/2015   Lab Results  Component Value Date   NA 135 10/07/2015   K 4.5 10/07/2015   CO2 28 10/07/2015   GLUCOSE 138* 10/07/2015   BUN 31* 10/07/2015   CREATININE 1.93* 10/07/2015   BILITOT 2.8* 09/15/2015   ALKPHOS 161* 09/15/2015   AST 40 09/15/2015   ALT 22 09/15/2015   PROT 5.5* 09/15/2015   ALBUMIN 2.9* 09/15/2015   CALCIUM 8.7* 10/07/2015   ANIONGAP 7 10/07/2015   GFR 31.10* 08/09/2015   No results found for: CHOL No results found for: HDL No results found for: LDLCALC No results found for: TRIG No results found for: CHOLHDL No results found for: HGBA1C     Assessment & Plan:   Problem List Items Addressed This Visit    Anemia    Likely multifactorial would beneift from referral to hematology for further consideration. Increase leafy greens, consider increased lean red meat and using cast iron cookware. Continue to monitor, report any concerns       Chronic diastolic heart failure ([place])    He follows closely with cardiology and is stable on current meds      Chronic kidney disease    Will continue to monitor, may need referral to nephrology      Melanoma [facility])    Referred to dermatology for evaluation of history of melanoma       Other Visit Diagnoses    Melanoma of skin ([place])    -  Primary    Relevant Orders    Ambulatory referral to Dermatology       I have discontinued Mr. Gurry tamsulosin. I am also having him maintain his ferrous sulfate, Magnesium, Cyanocobalamin, promethazine, feeding supplement (ENSURE ENLIVE), lactulose, rifaximin, spironolactone, digoxin, torsemide, potassium chloride SA, pantoprazole, ondansetron, metolazone, and b complex-vitamin c-folic acid.  Meds ordered this encounter  Medications  . b complex-vitamin c-folic acid (NEPHRO-VITE) 0.8 MG TABS tablet    Sig: Take 1 tablet by mouth daily.    Dispense:  30 tablet    Refill:  6     Penni Homans, MD

## 2015-10-24 NOTE — Assessment & Plan Note (Signed)
Will continue to monitor, may need referral to nephrology

## 2015-10-24 NOTE — Assessment & Plan Note (Signed)
He follows closely with cardiology and is stable on current meds

## 2015-10-24 NOTE — Assessment & Plan Note (Signed)
Referred to dermatology for evaluation of history of melanoma

## 2015-10-25 ENCOUNTER — Telehealth: Payer: Self-pay | Admitting: Family Medicine

## 2015-10-25 NOTE — Telephone Encounter (Signed)
Spoke to the patients wife and she stated he is seeing a GI MD and knows that is not the same thing as a hematologist, but also aware of his anemia and that he has HHT.  She will discuss and will call back if they decide to proceed with referral/or discuss with PCP at next OV in January in 2017.

## 2015-10-25 NOTE — Telephone Encounter (Signed)
-----   Message from Mosie Lukes, MD sent at 10/24/2015  9:13 PM EST ----- Reviewing his chart I believe he would benefit from a hematology consult due to his significant anemia. Please see if he has a hematologist already and if not then refer for anemia.

## 2015-10-27 ENCOUNTER — Other Ambulatory Visit: Payer: Self-pay | Admitting: Internal Medicine

## 2015-10-27 NOTE — Telephone Encounter (Signed)
Refill x 1 year 

## 2015-10-27 NOTE — Telephone Encounter (Signed)
Ok to refill [name]? 

## 2015-11-15 ENCOUNTER — Encounter: Payer: Self-pay | Admitting: Internal Medicine

## 2015-11-15 ENCOUNTER — Ambulatory Visit (INDEPENDENT_AMBULATORY_CARE_PROVIDER_SITE_OTHER): Payer: Medicare Other | Admitting: Internal Medicine

## 2015-11-15 ENCOUNTER — Other Ambulatory Visit (INDEPENDENT_AMBULATORY_CARE_PROVIDER_SITE_OTHER): Payer: Medicare Other

## 2015-11-15 VITALS — BP 112/60 | HR 88 | Ht 71.0 in | Wt 176.4 lb

## 2015-11-15 DIAGNOSIS — K7682 Hepatic encephalopathy: Secondary | ICD-10-CM

## 2015-11-15 DIAGNOSIS — K744 Secondary biliary cirrhosis: Secondary | ICD-10-CM

## 2015-11-15 DIAGNOSIS — D500 Iron deficiency anemia secondary to blood loss (chronic): Secondary | ICD-10-CM | POA: Diagnosis not present

## 2015-11-15 DIAGNOSIS — D509 Iron deficiency anemia, unspecified: Secondary | ICD-10-CM | POA: Diagnosis not present

## 2015-11-15 DIAGNOSIS — K7290 Hepatic failure, unspecified without coma: Secondary | ICD-10-CM

## 2015-11-15 LAB — COMPREHENSIVE METABOLIC PANEL
ALBUMIN: 3.2 g/dL — AB (ref 3.5–5.2)
ALK PHOS: 147 U/L — AB (ref 39–117)
ALT: 19 U/L (ref 0–53)
AST: 33 U/L (ref 0–37)
BILIRUBIN TOTAL: 2.2 mg/dL — AB (ref 0.2–1.2)
BUN: 28 mg/dL — AB (ref 6–23)
CO2: 27 mEq/L (ref 19–32)
CREATININE: 1.89 mg/dL — AB (ref 0.40–1.50)
Calcium: 8.8 mg/dL (ref 8.4–10.5)
Chloride: 99 mEq/L (ref 96–112)
GFR: 36.84 mL/min — ABNORMAL LOW (ref >60.00)
GLUCOSE: 90 mg/dL (ref 70–99)
POTASSIUM: 3.9 meq/L (ref 3.5–5.1)
SODIUM: 133 meq/L — AB (ref 135–145)
TOTAL PROTEIN: 5.7 g/dL — AB (ref 6.0–8.3)

## 2015-11-15 LAB — CBC WITH DIFFERENTIAL/PLATELET
BASOS PCT: 0.9 % (ref 0.0–3.0)
Basophils Absolute: 0 10*3/uL (ref 0.0–0.1)
EOS ABS: 0.1 10*3/uL (ref 0.0–0.7)
EOS PCT: 1.8 % (ref 0.0–5.0)
LYMPHS PCT: 15.3 % (ref 12.0–46.0)
Lymphs Abs: 0.5 10*3/uL — ABNORMAL LOW (ref 0.7–4.0)
MCHC: 31.6 g/dL (ref 30.0–36.0)
MCV: 83.8 fl (ref 78.0–100.0)
Monocytes Absolute: 0.7 10*3/uL (ref 0.1–1.0)
Monocytes Relative: 19.4 % — ABNORMAL HIGH (ref 3.0–12.0)
Neutro Abs: 2.1 10*3/uL (ref 1.4–7.7)
Neutrophils Relative %: 62.6 % (ref 43.0–77.0)
Platelets: 84 10*3/uL — ABNORMAL LOW (ref 150.0–400.0)
RBC: 2.8 Mil/uL — AB (ref 4.22–5.81)
RDW: 16.8 % — AB (ref 11.5–15.5)
WBC: 3.4 10*3/uL — AB (ref 4.0–10.5)

## 2015-11-15 LAB — FERRITIN: FERRITIN: 20 ng/mL — AB (ref 22.0–322.0)

## 2015-11-15 NOTE — Progress Notes (Signed)
   Subjective:    Patient ID: Hunter [name], male    DOB: 03/14/1935, 80 y.o.   MRN: [id number] Chief complaint: Follow-up cirrhosis HPI The patient is here for follow-up of his cirrhosis, and chronic blood loss anemia associated with hereditary hemorrhagic telangiectasia. Overall he and his wife think he is doing fairly well, he is going through physical therapy and that is helping and they're appreciative of the management of Dr. Haroldine Laws and Dr. Charlett Blake. Epistaxis is stable - controlled   Some possible melena after epistaxis  Mental status seems improved. Playing some computer games, texting on the phone, etc - was not before Overall improved  Medications, allergies, past medical history, past surgical history, family history and social history are reviewed and updated in the EMR.  Review of Systems As per history of present illness    Objective:   Physical Exam @BP  112/60 mmHg  Pulse 88  Ht 5\' 11"  (1.803 m)  Wt 176 lb 6 oz (80.003 kg)  BMI 24.61 kg/m2@  General:  NAD - appearing a bit stronger but chron ill Eyes:   anicteric Lungs:  clear Heart:: S1S2 2/6 SEM at LUSB, + JVD Abdomen:  soft and nontender, BS+, mild ascites Ext:   1+ bilat pretib edema,  Skin:  2+ pretib edema Neuro  No asterixis  Data Reviewed:  Cardiology and primary care notes. Recent labs in the EMR.     Assessment & Plan:  Anemia due to chronic blood loss CBC and ferritin today  Cirrhosis Stable edema/ascites CMET today  Hepatic encephalopathy (HCC) Stable - ok on Xifaxan   I will plan to see him back in 3 months routinely sooner if needed.

## 2015-11-15 NOTE — Progress Notes (Signed)
Quick Note:  Overall labs are stable He needs feraheme infusion x 2 for chronic blood loss iron def anemia please ______

## 2015-11-15 NOTE — Assessment & Plan Note (Signed)
Stable edema/ascites CMET today

## 2015-11-15 NOTE — Assessment & Plan Note (Signed)
Stable - ok on Xifaxan

## 2015-11-15 NOTE — Patient Instructions (Signed)
  Your physician has requested that you go to the basement for the following lab work before leaving today: CBC/diff, CMET, Ferritin   I appreciate the opportunity to care for you. Silvano Rusk, MD, [facility]

## 2015-11-15 NOTE — Assessment & Plan Note (Signed)
CBC and ferritin today

## 2015-11-16 ENCOUNTER — Other Ambulatory Visit: Payer: Self-pay

## 2015-11-16 DIAGNOSIS — D509 Iron deficiency anemia, unspecified: Secondary | ICD-10-CM

## 2015-11-18 ENCOUNTER — Encounter (HOSPITAL_COMMUNITY): Payer: Self-pay | Admitting: Internal Medicine

## 2015-11-18 ENCOUNTER — Ambulatory Visit (HOSPITAL_COMMUNITY)
Admission: RE | Admit: 2015-11-18 | Discharge: 2015-11-18 | Disposition: A | Discharge status: Home / Self Care | Payer: Medicare Other | Source: Ambulatory Visit | Attending: Internal Medicine | Admitting: Internal Medicine

## 2015-11-18 VITALS — BP 110/54 | HR 87 | Wt 174.0 lb

## 2015-11-18 DIAGNOSIS — Z85820 Personal history of malignant melanoma of skin: Secondary | ICD-10-CM | POA: Diagnosis not present

## 2015-11-18 DIAGNOSIS — D649 Anemia, unspecified: Secondary | ICD-10-CM | POA: Diagnosis not present

## 2015-11-18 DIAGNOSIS — Z950 Presence of cardiac pacemaker: Secondary | ICD-10-CM | POA: Insufficient documentation

## 2015-11-18 DIAGNOSIS — I509 Heart failure, unspecified: Secondary | ICD-10-CM

## 2015-11-18 DIAGNOSIS — N184 Chronic kidney disease, stage 4 (severe): Secondary | ICD-10-CM | POA: Diagnosis not present

## 2015-11-18 DIAGNOSIS — Z8249 Family history of ischemic heart disease and other diseases of the circulatory system: Secondary | ICD-10-CM | POA: Diagnosis not present

## 2015-11-18 DIAGNOSIS — K7290 Hepatic failure, unspecified without coma: Secondary | ICD-10-CM | POA: Diagnosis not present

## 2015-11-18 DIAGNOSIS — N183 Chronic kidney disease, stage 3 (moderate): Secondary | ICD-10-CM | POA: Insufficient documentation

## 2015-11-18 DIAGNOSIS — I5032 Chronic diastolic (congestive) heart failure: Secondary | ICD-10-CM

## 2015-11-18 DIAGNOSIS — I50810 Right heart failure, unspecified: Secondary | ICD-10-CM

## 2015-11-18 DIAGNOSIS — I272 Other secondary pulmonary hypertension: Secondary | ICD-10-CM | POA: Insufficient documentation

## 2015-11-18 DIAGNOSIS — Z79899 Other long term (current) drug therapy: Secondary | ICD-10-CM | POA: Diagnosis not present

## 2015-11-18 DIAGNOSIS — K7460 Unspecified cirrhosis of liver: Secondary | ICD-10-CM | POA: Insufficient documentation

## 2015-11-18 DIAGNOSIS — I780 Hereditary hemorrhagic telangiectasia: Secondary | ICD-10-CM | POA: Diagnosis not present

## 2015-11-18 MED ORDER — TORSEMIDE 20 MG PO TABS: ORAL_TABLET | ORAL | Status: DC

## 2015-11-18 NOTE — Progress Notes (Signed)
Advanced Heart Failure Clinic Note   Patient ID: Hunter [name], male   DOB: 08/25/1935, 80 y.o.   MRN: [id number]  Primary Cardiologist: Dr. Claude Manges, in [place], had also been acting as PCP Primary HF: Dr. Haroldine Laws  PCP: Randel Pigg  HPI:  Hunter [name] is a 80 y.o. male with a history of diastolic HF Echo 02/23/16 EF 45-50% NYHA class III, pulmonary hypertension, liver failure, CKD stage III, and hereditary hemorrhagic telangiectasia with hepatic AVMs and associated anemia requiring transfusion.[name]  He was admitted to [facility] on 8/30 after labs from [place] on 8/29 showed marked anemia with Hemoglobin of 5 and hyponatremia. He was transfused and also diuresed. EGD showed AVMs which were treated.  Weight on discharge 177 lbs.  He presents today for regular follow up with his wife. Has been doing well. Weight relatively stable 167-169.  Took metolazone once for weight 172. Improving HHPT. Appetite comes and goes. Gets around the house well. Able to go to mailbox and up and down a few steps. He denies Orthopnea/PND/CP. Dr. Carlean Purl planning to give him [facility].   Echo 08/11/15 45-50 %. RV normal. Marked biatrial enlargement with probable restrictive filling pattern. Moderate to severe MR. Moderate TR. PA peak pressure 51 mm Hg.  Labs: 09/15/15: Creatinine 1.77, K 3.2  11/15/15: K 3.9 Cr 1.89  Hgb 7.4  Records from [place]: Echo 3/16 EF 50-55%, Grade 2 DD, RV severely dilated Echo 3/15 EF 55-60%, mod enlarged RV, RV systolic function low normal. Increased LV filling pressures, Moderate MR  RHC 01/19/15 RA 24/18, (20) RV 45/20 PA 45/30 (35) PCW  mean 30    Past Medical History  Diagnosis Date  . CHF (congestive heart failure) ([place])   . Pacemaker     single lead Medtronic pacemaker  . HHT (hereditary hemorrhagic telangiectasia) ([place])   . Anemia   . Pulmonary hypertension ([place])   . 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 Renal insufficiency   . Liver failure ([place])   . Atrial  fibrillation ([place])   . Hepatic AV fistula ([place]) 08/09/2015  . 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 Anemia due to chronic blood loss 08/09/2015  . Right heart failure ([place]) 08/09/2015  . Tricuspid regurgitation 08/09/2015  . 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 Melanoma ([place])     2 on nose, 1 on back, 1 on leg    Current Outpatient Prescriptions  Medication Sig Dispense Refill  . b complex-vitamin c-folic acid (NEPHRO-VITE) 0.8 MG TABS tablet Take 1 tablet by mouth daily. 30 tablet 6  . Cyanocobalamin 1000 MCG/ML KIT Inject 1 Syringe as directed every 30 (thirty) days.    . digoxin (LANOXIN) 0.125 MG tablet Take 1 tablet (0.125 mg total) by mouth 3 (three) times a week. Mon, Wed, Fri 15 tablet 6  . feeding supplement, ENSURE ENLIVE, (ENSURE ENLIVE) LIQD Take 237 mLs by mouth 2 (two) times daily between meals. 237 mL 12  . ferrous sulfate 325 (65 FE) MG EC tablet Take 325 mg by mouth 2 (two) times daily with a meal.     . lactulose (CHRONULAC) 10 GM/15ML solution Take 45 mLs (30 g total) by mouth 2 (two) times daily. 240 mL 0  . Magnesium 250 MG TABS Take 1 tablet by mouth daily.    . ondansetron (ZOFRAN) 4 MG tablet Take 1 tablet (4 mg total) by mouth every 8 (eight) hours as needed for nausea or vomiting. 30 tablet 5  . pantoprazole (PROTONIX) 40 MG tablet Take 1 tablet (40  mg total) by mouth daily. 30 tablet 6  . potassium chloride SA (K-DUR,KLOR-CON) 20 MEQ tablet Take 40 meq (2 tablets) in the am and 20 meq (1 tablet) in the evening 90 tablet 3  . spironolactone (ALDACTONE) 50 MG tablet Take 1 tablet (50 mg total) by mouth daily. 30 tablet 3  . torsemide (DEMADEX) 20 MG tablet Take 80 mg (4 tablets) in the am and 40 mg (2 tablets) in the evening 180 tablet 3  . XIFAXAN 550 MG TABS tablet TAKE ONE TABLET BY MOUTH TWICE DAILY 60 tablet 11  . metolazone (ZAROXOLYN) 2.5 MG tablet Take 2.5 mg by mouth as needed (for weight gain).    . promethazine (PHENERGAN) 25 MG tablet Take 6.25 mg by mouth daily  as needed for nausea or vomiting.      No current facility-administered medications for this encounter.    No Known Allergies    Social History   Social History  . Marital Status: Married    Spouse Name: N/A  . Number of Children: 1  . Years of Education: N/A   Occupational History  . retired    Social History Main Topics  . Smoking status: Never Smoker   . Smokeless tobacco: Never Used  . Alcohol Use: No  . Drug Use: No  . 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 Not on file   Social History Narrative   Married 1 son   Retired Therapist, art   One caffeinated beverage a day no alcohol   He was living in [employer] at the [place] on [address] met in [place] and has located to [place] recently.   08/09/2015         Family History  Problem Relation Age of Onset  . Other Mother     HHT  . Other Son     HHT  . Heart failure Father   . Heart disease Sister     afib, pacer  . Arthritis Brother     b/l TKR  . Other Brother   . Heart disease Sister     pacer    Filed Vitals:   11/18/15 1142  BP: 110/54  Pulse: 87  Weight: 174 lb (78.926 kg)  SpO2: 100%    PHYSICAL EXAM: General: Elderly, NAD HEENT: Diffuse telengectasias.  Neck: supple. JVP 8 . Carotids 2+ bilat; no bruits. No lymphadenopathy or thryomegaly. Cor: PMI nondisplaced. Regular rate & rhythm. II/VI SEM at LUSB and apex Lungs: Diminished air movement in the bases L>R Abdomen: soft, NT, mild distention. No HSM appreciated. No bruits or masses. +BS Extremities: no cyanosis, clubbing, rash. 1-2+ LE edema. no asterixis Neuro: alert & orientedx3, cranial nerves grossly intact. moves all 4 extremities w/o difficulty. Affect flat.    ASSESSMENT & PLAN:  1. Chronic diastolic CHF Echo 05/26/06 45-50%.  2. Pulmonary hypertension on Echo 08/11/15 PA  peak pressure 51 mm Hg 3. Liver failure / cirrhosis 4. CKD Stage III 5. Chronic Anemia 6. Hereditary telangiectasia. 7. Hepatic encephalopathy  Much improved. Volume status still a little elevated. Renal function is stable. Will increase torsemide to 80/60 and follow renal function very closely. Continue spiro. Take metolazone for weight of 170 or greater. BMET 1 week.   Mayari Matus,MD 12:23 PM

## 2015-11-18 NOTE — Patient Instructions (Signed)
INCREASE Torsemide to 80mg  in the morning and 60mg  in the evening.  LABS: 1 week (bmet)  FOLLOW UP: 2-3 months with Dr.Bensimhon  Do the following things EVERYDAY: 1) Weigh yourself in the morning before breakfast. Write it down and keep it in a log. 2) Take your medicines as prescribed 3) Eat low salt foods-Limit salt (sodium) to 2000 mg per day.  4) Stay as active as you can everyday 5) Limit all fluids for the day to less than 2 liters

## 2015-11-18 NOTE — Addendum Note (Signed)
Encounter addended by: Harvie Junior, CMA on: 11/18/2015 12:40 PM<BR>     Documentation filed: Patient Instructions Section, Orders

## 2015-11-26 ENCOUNTER — Encounter (HOSPITAL_COMMUNITY): Payer: Self-pay

## 2015-11-26 ENCOUNTER — Encounter (HOSPITAL_COMMUNITY)
Admission: RE | Admit: 2015-11-26 | Discharge: 2015-11-26 | Disposition: A | Discharge status: Home / Self Care | Payer: Medicare Other | Source: Ambulatory Visit | Attending: Internal Medicine | Admitting: Internal Medicine

## 2015-11-26 ENCOUNTER — Ambulatory Visit (HOSPITAL_COMMUNITY)
Admission: RE | Admit: 2015-11-26 | Discharge: 2015-11-26 | Disposition: A | Discharge status: Home / Self Care | Payer: Medicare Other | Source: Ambulatory Visit | Attending: Internal Medicine | Admitting: Internal Medicine

## 2015-11-26 VITALS — BP 99/44 | HR 79 | Temp 98.3°F | Resp 18

## 2015-11-26 DIAGNOSIS — D509 Iron deficiency anemia, unspecified: Secondary | ICD-10-CM

## 2015-11-26 DIAGNOSIS — I5032 Chronic diastolic (congestive) heart failure: Secondary | ICD-10-CM

## 2015-11-26 LAB — BASIC METABOLIC PANEL
ANION GAP: 12 (ref 5–15)
BUN: 29 mg/dL — ABNORMAL HIGH (ref 6–20)
CO2: 21 mmol/L — ABNORMAL LOW (ref 22–32)
Calcium: 8.5 mg/dL — ABNORMAL LOW (ref 8.9–10.3)
Chloride: 99 mmol/L — ABNORMAL LOW (ref 101–111)
Creatinine, Ser: 2.11 mg/dL — ABNORMAL HIGH (ref 0.61–1.24)
GFR calc Af Amer: 33 mL/min — ABNORMAL LOW (ref >60)
GFR, EST NON AFRICAN AMERICAN: 28 mL/min — AB (ref >60)
Glucose, Bld: 154 mg/dL — ABNORMAL HIGH (ref 65–99)
POTASSIUM: 4.5 mmol/L (ref 3.5–5.1)
SODIUM: 132 mmol/L — AB (ref 135–145)

## 2015-11-26 MED ORDER — SODIUM CHLORIDE 0.9 % IV SOLN
Freq: Once | INTRAVENOUS | Status: AC
  Administered 2015-11-26: 15:00:00 via INTRAVENOUS

## 2015-11-26 MED ORDER — FERUMOXYTOL INJECTION 510 MG/17 ML
510.0000 mg | INTRAVENOUS | Status: DC
  Administered 2015-11-26: 510 mg via INTRAVENOUS
  Filled 2015-11-26: qty 17

## 2015-11-26 NOTE — Discharge Instructions (Signed)

## 2015-11-26 NOTE — Progress Notes (Signed)
1st feraheme infusion tolerated well. Pt stayed 30 min. Post infusion.  D/c instructions on feraheme given to wife and explained.  Next feraheme is scheduled for Dec.23, pt and pt wife is aware.  Pt was d/c via wheelchair to lobby.

## 2015-12-02 ENCOUNTER — Telehealth (HOSPITAL_COMMUNITY): Payer: Self-pay | Admitting: Cardiology

## 2015-12-02 NOTE — Telephone Encounter (Signed)
Patient has been unable to keep PT schedule this week as availability for [facility] and patients appointments for iron infusion has conflicted  Tye Maryland, PT with [facility] called to request verbal to go out to seen patient for additional visit x1, re access and re cert if neccessary  Verbal order given

## 2015-12-03 ENCOUNTER — Encounter (HOSPITAL_COMMUNITY)
Admission: RE | Admit: 2015-12-03 | Discharge: 2015-12-03 | Disposition: A | Discharge status: Home / Self Care | Payer: Medicare Other | Source: Ambulatory Visit | Attending: Internal Medicine | Admitting: Internal Medicine

## 2015-12-03 ENCOUNTER — Encounter (HOSPITAL_COMMUNITY): Payer: Self-pay

## 2015-12-03 VITALS — BP 96/55 | HR 80 | Temp 96.3°F | Resp 18

## 2015-12-03 DIAGNOSIS — D509 Iron deficiency anemia, unspecified: Secondary | ICD-10-CM | POA: Diagnosis not present

## 2015-12-03 MED ORDER — FERUMOXYTOL INJECTION 510 MG/17 ML
510.0000 mg | INTRAVENOUS | Status: AC
  Administered 2015-12-03: 510 mg via INTRAVENOUS
  Filled 2015-12-03: qty 17

## 2015-12-03 MED ORDER — SODIUM CHLORIDE 0.9 % IV SOLN
Freq: Once | INTRAVENOUS | Status: AC
  Administered 2015-12-03: 09:00:00 via INTRAVENOUS

## 2015-12-03 NOTE — Progress Notes (Signed)
Patient has tolerated Feraheme well. Dozed during infusion. Awakened out of sleep for DC

## 2015-12-28 ENCOUNTER — Ambulatory Visit: Payer: Medicare Other | Admitting: Family Medicine

## 2016-01-21 ENCOUNTER — Ambulatory Visit (INDEPENDENT_AMBULATORY_CARE_PROVIDER_SITE_OTHER): Payer: Medicare Other | Admitting: Family Medicine

## 2016-01-21 ENCOUNTER — Encounter: Payer: Self-pay | Admitting: Family Medicine

## 2016-01-21 VITALS — BP 120/50 | HR 97 | Temp 97.9°F | Ht 71.0 in | Wt 178.1 lb

## 2016-01-21 DIAGNOSIS — D62 Acute posthemorrhagic anemia: Secondary | ICD-10-CM

## 2016-01-21 DIAGNOSIS — Z23 Encounter for immunization: Secondary | ICD-10-CM

## 2016-01-21 DIAGNOSIS — E871 Hypo-osmolality and hyponatremia: Secondary | ICD-10-CM

## 2016-01-21 DIAGNOSIS — K7682 Hepatic encephalopathy: Secondary | ICD-10-CM

## 2016-01-21 DIAGNOSIS — D649 Anemia, unspecified: Secondary | ICD-10-CM | POA: Diagnosis not present

## 2016-01-21 DIAGNOSIS — I780 Hereditary hemorrhagic telangiectasia: Secondary | ICD-10-CM

## 2016-01-21 DIAGNOSIS — N184 Chronic kidney disease, stage 4 (severe): Secondary | ICD-10-CM

## 2016-01-21 DIAGNOSIS — I959 Hypotension, unspecified: Secondary | ICD-10-CM | POA: Diagnosis not present

## 2016-01-21 DIAGNOSIS — I5032 Chronic diastolic (congestive) heart failure: Secondary | ICD-10-CM | POA: Diagnosis not present

## 2016-01-21 DIAGNOSIS — D500 Iron deficiency anemia secondary to blood loss (chronic): Secondary | ICD-10-CM

## 2016-01-21 DIAGNOSIS — K7290 Hepatic failure, unspecified without coma: Secondary | ICD-10-CM

## 2016-01-21 DIAGNOSIS — I509 Heart failure, unspecified: Secondary | ICD-10-CM

## 2016-01-21 DIAGNOSIS — E538 Deficiency of other specified B group vitamins: Secondary | ICD-10-CM

## 2016-01-21 LAB — COMPREHENSIVE METABOLIC PANEL
ALT: 17 U/L (ref 9–46)
AST: 29 U/L (ref 10–35)
Albumin: 3.2 g/dL — ABNORMAL LOW (ref 3.6–5.1)
Alkaline Phosphatase: 129 U/L — ABNORMAL HIGH (ref 40–115)
BILIRUBIN TOTAL: 2.3 mg/dL — AB (ref 0.2–1.2)
BUN: 29 mg/dL — ABNORMAL HIGH (ref 7–25)
CALCIUM: 8.6 mg/dL (ref 8.6–10.3)
CO2: 28 mmol/L (ref 20–31)
CREATININE: 1.65 mg/dL — AB (ref 0.70–1.18)
Chloride: 95 mmol/L — ABNORMAL LOW (ref 98–110)
GLUCOSE: 58 mg/dL — AB (ref 65–99)
Potassium: 3.7 mmol/L (ref 3.5–5.3)
SODIUM: 133 mmol/L — AB (ref 135–146)
Total Protein: 5.3 g/dL — ABNORMAL LOW (ref 6.1–8.1)

## 2016-01-21 MED ORDER — TRAMADOL HCL 50 MG PO TBDP: 25.0000 mg | ORAL_TABLET | Freq: Every evening | ORAL | Status: DC | PRN

## 2016-01-21 MED ORDER — PNEUMOCOCCAL 13-VAL CONJ VACC IM SUSP: 0.5000 mL | INTRAMUSCULAR | Status: DC

## 2016-01-21 MED ORDER — CYANOCOBALAMIN 1000 MCG/ML IJ KIT: 1.0000 | PACK | INTRAMUSCULAR | Status: DC

## 2016-01-21 NOTE — Assessment & Plan Note (Signed)
Vitamin b12 supplemented

## 2016-01-21 NOTE — Progress Notes (Signed)
Pre visit review using our clinic review tool, if applicable. No additional management support is needed unless otherwise documented below in the visit note. 

## 2016-01-21 NOTE — Patient Instructions (Signed)
Chronic Kidney Disease °Chronic kidney disease occurs when the kidneys are damaged over a long period. The kidneys are two organs that lie on either side of the spine between the middle of the back and the front of the abdomen. The kidneys: °· Remove wastes and extra water from the blood. °· Produce important hormones. These help keep bones strong, regulate blood pressure, and help create red blood cells. °· Balance the fluids and chemicals in the blood and tissues. °A small amount of kidney damage may not cause problems, but a large amount of damage may make it difficult or impossible for the kidneys to work the way they should. If steps are not taken to slow down the kidney damage or stop it from getting worse, the kidneys may stop working permanently. Most of the time, chronic kidney disease does not go away. However, it can often be controlled, and those with the disease can usually live normal lives. °CAUSES °The most common causes of chronic kidney disease are diabetes and high blood pressure (hypertension). Chronic kidney disease may also be caused by: °· Diseases that cause the kidneys' filters to become inflamed. °· Diseases that affect the immune system. °· Genetic diseases. °· Medicines that damage the kidneys, such as anti-inflammatory medicines. °· Poisoning or exposure to toxic substances. °· A reoccurring kidney or urinary infection. °· A problem with urine flow. This may be caused by: °¨ Cancer. °¨ Kidney stones. °¨ An enlarged prostate in males. °SIGNS AND SYMPTOMS °Because the kidney damage in chronic kidney disease occurs slowly, symptoms develop slowly and may not be obvious until the kidney damage becomes severe. A person may have a kidney disease for years without showing any symptoms. Symptoms can include: °· Swelling (edema) of the legs, ankles, or feet. °· Tiredness (lethargy). °· Nausea or vomiting. °· Confusion. °· Problems with urination, such as: °¨ Decreased urine  production. °¨ Frequent urination, especially at night. °¨ Frequent accidents in children who are potty trained. °· Muscle twitches and cramps. °· Shortness of breath. °· Weakness. °· Persistent itchiness. °· Loss of appetite. °· Metallic taste in the mouth. °· Trouble sleeping. °· Slowed development in children. °· Short stature in children. °DIAGNOSIS °Chronic kidney disease may be detected and diagnosed by tests, including blood, urine, imaging, or kidney biopsy tests. °TREATMENT °Most chronic kidney diseases cannot be cured. Treatment usually involves relieving symptoms and preventing or slowing the progression of the disease. Treatment may include: °· A special diet. You may need to avoid alcohol and foods that are salty and high in potassium. °· Medicines. These may: °¨ Lower blood pressure. °¨ Relieve anemia. °¨ Relieve swelling. °¨ Protect the bones. °HOME CARE INSTRUCTIONS °· Follow your prescribed diet.  Your health care provider may instruct you to limit daily salt (sodium) and protein intake. °· Take medicines only as directed by your health care provider. Do not take any new medicines (prescription, over-the-counter, or nutritional supplements) unless approved by your health care provider. Many medicines can worsen your kidney damage or need to have the dose adjusted.   °· Quit smoking if you smoke. Talk to your health care provider about a smoking cessation program. °· Keep all follow-up visits as directed by your health care provider. °· Monitor your blood pressure. °· Start or continue an exercise plan. °· Get immunizations as directed by your health care provider. °· Take vitamin and mineral supplements as directed by your health care provider. °SEEK IMMEDIATE MEDICAL CARE IF: °· Your symptoms get worse or you develop   new symptoms. °· You develop symptoms of end-stage kidney disease. These include: °¨ Headaches. °¨ Abnormally dark or light skin. °¨ Numbness in the hands or feet. °¨ Easy  bruising. °¨ Frequent hiccups. °¨ Menstruation stops. °· You have a fever. °· You have decreased urine production. °· You have pain or bleeding when urinating. °MAKE SURE YOU: °· Understand these instructions. °· Will watch your condition. °· Will get help right away if you are not doing well or get worse. °FOR MORE INFORMATION  °· American Association of Kidney Patients: www.aakp.org °· National Kidney Foundation: www.kidney.org °· American Kidney Fund: www.akfinc.org °· [employer] [facility]: www.lifeoptions.org and www.kidneyschool.org °  °This information is not intended to replace advice given to you by your health care provider. Make sure you discuss any questions you have with your health care provider. °  °Document Released: 09/05/2008 Document Revised: 12/18/2014 Document Reviewed: 07/26/2012 °Elsevier Interactive Patient Education ©2016 Elsevier Inc. ° °

## 2016-01-21 NOTE — Progress Notes (Signed)
Subjective:    Patient ID: Quinterius Gaida, male    DOB: 12-20-1934, 81 y.o.   MRN: [id number]  Chief Complaint  Patient presents with  . Follow-up    HPI Patient is in today for follow up. Is doing well today, no recent hospitalization. No acute illness. No new concerns. Denies CP/palp/SOB/HA/congestion/fevers/GI or GU c/o. Taking meds as prescribed. Denies any worsening edema.  Past Medical History  Diagnosis Date  . CHF (congestive heart failure) ([place])   . Pacemaker     single lead Medtronic pacemaker  . HHT (hereditary hemorrhagic telangiectasia) ([place])   . Anemia   . Pulmonary hypertension ([place])   . 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 Renal insufficiency   . Liver failure ([place])   . Atrial fibrillation ([place])   . Hepatic AV fistula ([place]) 08/09/2015  . 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 Anemia due to chronic blood loss 08/09/2015  . Right heart failure ([place]) 08/09/2015  . Tricuspid regurgitation 08/09/2015  . 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 Melanoma ([place])     2 on nose, 1 on back, 1 on leg    Past Surgical History  Procedure Laterality Date  . Pacemaker insertion    . Colon surgery  1196  . Esophagogastroduodenoscopy N/A 08/13/2015    Procedure: ESOPHAGOGASTRODUODENOSCOPY (EGD);  Surgeon: Ladene Artist, MD;  Location: [facility] ENDOSCOPY;  Service: Endoscopy;  Laterality: N/A;  . Tonsillectomy Bilateral     Family History  Problem Relation Age of Onset  . Other Mother     HHT  . Other Son     HHT  . Heart failure Father   . Heart disease Sister     afib, pacer  . Arthritis Brother     b/l TKR  . Other Brother   . Heart disease Sister     pacer    Social History   Social History  . Marital Status: Married    Spouse Name: N/A  . Number of Children: 1  . Years of Education: N/A   Occupational History  . retired    Social History Main Topics  . Smoking status: Never Smoker   . Smokeless tobacco: Never Used  . Alcohol Use: No  . Drug Use: No  . 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 Not on file   Social History Narrative   Married 1 son   Retired Therapist, art   One caffeinated beverage a day no alcohol   He was living in [employer] at the [place] met in [place] and has located to [place] recently.   08/09/2015       Outpatient Prescriptions Prior to Visit  Medication Sig Dispense Refill  . b complex-vitamin c-folic acid (NEPHRO-VITE) 0.8 MG TABS tablet Take 1 tablet by mouth daily. 30 tablet 6  . digoxin (LANOXIN) 0.125 MG tablet Take 1 tablet (0.125 mg total) by mouth 3 (three) times a week. Mon, Wed, Fri 15 tablet 6  . feeding supplement, ENSURE ENLIVE, (ENSURE ENLIVE) LIQD Take 237 mLs by mouth 2 (two) times daily between meals. 237 mL 12  . ferrous sulfate 325 (65 FE) MG EC tablet Take 325 mg by mouth 2 (two) times daily with a meal.     . Magnesium 250 MG TABS Take 1 tablet by mouth daily.    [name]  metolazone (ZAROXOLYN) 2.5 MG tablet Take 2.5 mg by mouth as needed (for weight gain).    . ondansetron (ZOFRAN) 4 MG tablet Take 1 tablet (4 mg total) by mouth every 8 (eight) hours as needed for nausea or vomiting. 30 tablet 5  . pantoprazole (PROTONIX) 40 MG tablet Take 1 tablet (40 mg total) by mouth daily. 30 tablet 6  . potassium chloride SA (K-DUR,KLOR-CON) 20 MEQ tablet Take 40 meq (2 tablets) in the am and 20 meq (1 tablet) in the evening 90 tablet 3  . promethazine (PHENERGAN) 25 MG tablet Take 6.25 mg by mouth daily as needed for nausea or vomiting.     [name] spironolactone (ALDACTONE) 50 MG tablet Take 1 tablet (50 mg total) by mouth daily. 30 tablet 3  . torsemide (DEMADEX) 20 MG tablet Take 80 mg (4 tablets) in the am and 60 mg (3 tablets) in the evening 360 tablet 3  . XIFAXAN 550 MG TABS tablet TAKE ONE TABLET BY MOUTH TWICE DAILY 60 tablet 11  . Cyanocobalamin  1000 MCG/ML KIT Inject 1 Syringe as directed every 30 (thirty) days.    [name] lactulose (CHRONULAC) 10 GM/15ML solution Take 45 mLs (30 g total) by mouth 2 (two) times daily. 240 mL 0   No facility-administered medications prior to visit.    No Known Allergies  Review of Systems  Constitutional: Positive for malaise/fatigue. Negative for fever.  HENT: Negative for congestion.   Eyes: Negative for discharge.  Respiratory: Negative for shortness of breath.   Cardiovascular: Negative for chest pain, palpitations and leg swelling.  Gastrointestinal: Negative for nausea and abdominal pain.  Genitourinary: Negative for dysuria.  Musculoskeletal: Positive for myalgias. Negative for falls.  Skin: Negative for rash.  Neurological: Negative for loss of consciousness and headaches.  Endo/Heme/Allergies: Negative for environmental allergies.  Psychiatric/Behavioral: Negative for depression. The patient is not nervous/anxious.        Objective:    Physical Exam  Constitutional: He is oriented to person, place, and time. He appears well-developed and well-nourished. No distress.  HENT:  Head: Normocephalic and atraumatic.  Nose: Nose normal.  Eyes: Right eye exhibits no discharge. Left eye exhibits no discharge.  Neck: Normal range of motion. Neck supple.  Cardiovascular: Normal rate and regular rhythm.   Pulmonary/Chest: Effort normal and breath sounds normal.  Abdominal: Soft. Bowel sounds are normal. There is no tenderness.  Musculoskeletal: He exhibits no edema.  Neurological: He is alert and oriented to person, place, and time.  Skin: Skin is warm and dry.  Psychiatric: He has a normal mood and affect.  Nursing note and vitals reviewed.   BP 120/50 mmHg  Pulse 97  Temp(Src) 97.9 F (36.6 C) (Oral)  Ht _0  (1.803 m)  Wt 178 lb 2 oz (80.797 kg)  BMI 24.85 kg/m2  SpO2 100% Wt Readings from Last 3 Encounters:  01/21/16 178 lb 2 oz (80.797 kg)  11/18/15 174 lb (78.926 kg)    11/15/15 176 lb 6 oz (80.003 kg)     Lab Results  Component Value Date   WBC 3.9* 01/21/2016   HGB 7.5* 01/21/2016   HCT 24.2* 01/21/2016   PLT 65* 01/21/2016   GLUCOSE 58* 01/21/2016   ALT 17 01/21/2016   AST 29 01/21/2016   NA 133* 01/21/2016   K 3.7 01/21/2016   CL 95* 01/21/2016   CREATININE 1.65* 01/21/2016   BUN 29* 01/21/2016   CO2 28 01/21/2016   TSH 5.49* 08/09/2015   INR 1.27  08/12/2015    Lab Results  Component Value Date   TSH 5.49* 08/09/2015   Lab Results  Component Value Date   WBC 3.9* 01/21/2016   HGB 7.5* 01/21/2016   HCT 24.2* 01/21/2016   MCV 87.7 01/21/2016   PLT 65* 01/21/2016   Lab Results  Component Value Date   NA 133* 01/21/2016   K 3.7 01/21/2016   CO2 28 01/21/2016   GLUCOSE 58* 01/21/2016   BUN 29* 01/21/2016   CREATININE 1.65* 01/21/2016   BILITOT 2.3* 01/21/2016   ALKPHOS 129* 01/21/2016   AST 29 01/21/2016   ALT 17 01/21/2016   PROT 5.3* 01/21/2016   ALBUMIN 3.2* 01/21/2016   CALCIUM 8.6 01/21/2016   ANIONGAP 12 11/26/2015   GFR 36.84* 11/15/2015       Assessment & Plan:   Problem List Items Addressed This Visit    Acute blood loss anemia - Primary   Relevant Medications   Cyanocobalamin 1000 MCG/ML KIT   Other Relevant Orders   CBC (Completed)   Comp Met (CMET) (Completed)   B12 (Completed)   Anemia    Stable, Increase leafy greens, consider increased lean red meat and using cast iron cookware. Continue to monitor, report any concerns. Is following with gastroenterology      Relevant Medications   Cyanocobalamin 1000 MCG/ML KIT   Other Relevant Orders   CBC (Completed)   Comp Met (CMET) (Completed)   B12 (Completed)   Anemia due to chronic blood loss   Relevant Medications   Cyanocobalamin 1000 MCG/ML KIT   Other Relevant Orders   CBC (Completed)   Comp Met (CMET) (Completed)   B12 (Completed)   RESOLVED: Arterial hypotension   Relevant Orders   CBC (Completed)   Comp Met (CMET) (Completed)   B12  (Completed)   Chronic diastolic heart failure [facility])    Following with cardiology. euvolemic today. No changes      Relevant Orders   CBC (Completed)   Comp Met (CMET) (Completed)   B12 (Completed)   Chronic kidney disease    Stable, maintain hydration and monitor      Relevant Orders   CBC (Completed)   Comp Met (CMET) (Completed)   B12 (Completed)   Congestive heart disease ([place])   Relevant Orders   CBC (Completed)   Comp Met (CMET) (Completed)   B12 (Completed)   Hepatic encephalopathy (HCC)   Relevant Orders   CBC (Completed)   Comp Met (CMET) (Completed)   B12 (Completed)   HHT (hereditary hemorrhagic telangiectasia) (HCC)    Vitamin b12 supplemented      Relevant Orders   CBC (Completed)   Comp Met (CMET) (Completed)   B12 (Completed)   Hyponatremia    Mild, asymptomatic. No changes for now       Other Visit Diagnoses    Vitamin B12 deficiency        Relevant Orders    CBC (Completed)    Comp Met (CMET) (Completed)    B12 (Completed)    Need for vaccination with 13-polyvalent pneumococcal conjugate vaccine        Relevant Medications    pneumococcal 13-valent conjugate vaccine (PREVNAR 13) SUSP injection    Other Relevant Orders    CBC (Completed)    Comp Met (CMET) (Completed)    B12 (Completed)       I am having Mr. Carithers start on TraMADol HCl and pneumococcal 13-valent conjugate vaccine. I am also having him maintain his ferrous sulfate, Magnesium, promethazine,  feeding supplement (ENSURE ENLIVE), spironolactone, digoxin, potassium chloride SA, pantoprazole, ondansetron, metolazone, b complex-vitamin c-folic acid, XIFAXAN, torsemide, and Cyanocobalamin.  Meds ordered this encounter  Medications  . Cyanocobalamin 1000 MCG/ML KIT    Sig: Inject 1 Syringe as directed every 30 (thirty) days.    Dispense:  1 kit    Refill:  1  . TraMADol HCl 50 MG TBDP    Sig: Take 25-50 mg by mouth at bedtime as needed.    Dispense:  10 tablet    Refill:  0  .  pneumococcal 13-valent conjugate vaccine (PREVNAR 13) SUSP injection    Sig: Inject 0.5 mLs into the muscle tomorrow at 10 am.    Dispense:  0.5 mL    Refill:  0     Penni Homans, MD

## 2016-01-22 LAB — CBC
HCT: 24.2 % — ABNORMAL LOW (ref 39.0–52.0)
Hemoglobin: 7.5 g/dL — ABNORMAL LOW (ref 13.0–17.0)
MCH: 27.2 pg (ref 26.0–34.0)
MCHC: 31 g/dL (ref 30.0–36.0)
MCV: 87.7 fL (ref 78.0–100.0)
MPV: 10.6 fL (ref 8.6–12.4)
PLATELETS: 65 10*3/uL — AB (ref 150–400)
RBC: 2.76 MIL/uL — ABNORMAL LOW (ref 4.22–5.81)
RDW: 17.8 % — AB (ref 11.5–15.5)
WBC: 3.9 10*3/uL — AB (ref 4.0–10.5)

## 2016-01-22 LAB — VITAMIN B12: Vitamin B-12: >2000 pg/mL — ABNORMAL HIGH (ref 200–1100)

## 2016-01-24 ENCOUNTER — Other Ambulatory Visit: Payer: Self-pay | Admitting: Family Medicine

## 2016-01-24 MED ORDER — CYANOCOBALAMIN 1000 MCG/ML IJ SOLN: 1000.0000 ug | Freq: Once | INTRAMUSCULAR | Status: DC

## 2016-01-25 ENCOUNTER — Other Ambulatory Visit: Payer: Self-pay | Admitting: Family Medicine

## 2016-01-25 MED ORDER — LACTULOSE 10 GM/15ML PO SOLN: 30.0000 g | Freq: Two times a day (BID) | ORAL | Status: DC

## 2016-01-26 ENCOUNTER — Encounter: Payer: Self-pay | Admitting: Family Medicine

## 2016-01-27 ENCOUNTER — Other Ambulatory Visit: Payer: Self-pay | Admitting: Family Medicine

## 2016-01-27 MED ORDER — "SYRINGE/NEEDLE (DISP) 25G X 5/8"" 3 ML MISC": Status: DC

## 2016-01-27 MED ORDER — LACTULOSE 10 GM/15ML PO SOLN: 30.0000 g | Freq: Two times a day (BID) | ORAL | Status: DC

## 2016-01-30 ENCOUNTER — Encounter: Payer: Self-pay | Admitting: Family Medicine

## 2016-01-30 NOTE — Assessment & Plan Note (Signed)
Following with cardiology. euvolemic today. No changes

## 2016-01-30 NOTE — Assessment & Plan Note (Signed)
Stable, maintain hydration and monitor

## 2016-01-30 NOTE — Assessment & Plan Note (Signed)
Stable, Increase leafy greens, consider increased lean red meat and using cast iron cookware. Continue to monitor, report any concerns. Is following with gastroenterology

## 2016-01-30 NOTE — Assessment & Plan Note (Signed)
Mild, asymptomatic. No changes for now

## 2016-02-13 ENCOUNTER — Other Ambulatory Visit (HOSPITAL_COMMUNITY): Payer: Self-pay | Admitting: Internal Medicine

## 2016-02-24 ENCOUNTER — Ambulatory Visit (INDEPENDENT_AMBULATORY_CARE_PROVIDER_SITE_OTHER): Payer: Medicare Other | Admitting: Internal Medicine

## 2016-02-24 ENCOUNTER — Other Ambulatory Visit (INDEPENDENT_AMBULATORY_CARE_PROVIDER_SITE_OTHER): Payer: Medicare Other

## 2016-02-24 ENCOUNTER — Other Ambulatory Visit: Payer: Self-pay

## 2016-02-24 ENCOUNTER — Encounter: Payer: Self-pay | Admitting: Internal Medicine

## 2016-02-24 VITALS — BP 100/50 | HR 112 | Ht 71.0 in | Wt 177.0 lb

## 2016-02-24 DIAGNOSIS — D500 Iron deficiency anemia secondary to blood loss (chronic): Secondary | ICD-10-CM

## 2016-02-24 DIAGNOSIS — D62 Acute posthemorrhagic anemia: Secondary | ICD-10-CM

## 2016-02-24 DIAGNOSIS — K7469 Other cirrhosis of liver: Secondary | ICD-10-CM | POA: Diagnosis not present

## 2016-02-24 DIAGNOSIS — K7682 Hepatic encephalopathy: Secondary | ICD-10-CM

## 2016-02-24 DIAGNOSIS — K7290 Hepatic failure, unspecified without coma: Secondary | ICD-10-CM

## 2016-02-24 LAB — CBC WITH DIFFERENTIAL/PLATELET
Basophils Absolute: 0 10*3/uL (ref 0.0–0.1)
Basophils Relative: 0.1 % (ref 0.0–3.0)
EOS PCT: 0 % (ref 0.0–5.0)
Eosinophils Absolute: 0 10*3/uL (ref 0.0–0.7)
HCT: 20.9 % — CL (ref 39.0–52.0)
Hemoglobin: 6.5 g/dL — CL (ref 13.0–17.0)
LYMPHS ABS: 0.3 10*3/uL — AB (ref 0.7–4.0)
Lymphocytes Relative: 6.3 % — ABNORMAL LOW (ref 12.0–46.0)
MCHC: 31 g/dL (ref 30.0–36.0)
MCV: 83.8 fl (ref 78.0–100.0)
MONOS PCT: 16.9 % — AB (ref 3.0–12.0)
Monocytes Absolute: 0.7 10*3/uL (ref 0.1–1.0)
NEUTROS PCT: 76.7 % (ref 43.0–77.0)
Neutro Abs: 3.2 10*3/uL (ref 1.4–7.7)
Platelets: 73 10*3/uL — ABNORMAL LOW (ref 150.0–400.0)
RDW: 17.4 % — ABNORMAL HIGH (ref 11.5–15.5)
WBC: 4.2 10*3/uL (ref 4.0–10.5)

## 2016-02-24 LAB — COMPREHENSIVE METABOLIC PANEL
ALBUMIN: 3.1 g/dL — AB (ref 3.5–5.2)
ALK PHOS: 122 U/L — AB (ref 39–117)
ALT: 18 U/L (ref 0–53)
AST: 26 U/L (ref 0–37)
BUN: 44 mg/dL — ABNORMAL HIGH (ref 6–23)
CALCIUM: 8.7 mg/dL (ref 8.4–10.5)
CHLORIDE: 96 meq/L (ref 96–112)
CO2: 30 mEq/L (ref 19–32)
Creatinine, Ser: 1.98 mg/dL — ABNORMAL HIGH (ref 0.40–1.50)
GFR: 34.89 mL/min — AB (ref >60.00)
Glucose, Bld: 111 mg/dL — ABNORMAL HIGH (ref 70–99)
POTASSIUM: 3.8 meq/L (ref 3.5–5.1)
SODIUM: 132 meq/L — AB (ref 135–145)
Total Bilirubin: 2.2 mg/dL — ABNORMAL HIGH (ref 0.2–1.2)
Total Protein: 5.3 g/dL — ABNORMAL LOW (ref 6.0–8.3)

## 2016-02-24 LAB — PROTIME-INR
INR: 1.4 ratio — AB (ref 0.8–1.0)
PROTHROMBIN TIME: 14.4 s — AB (ref 9.6–13.1)

## 2016-02-24 LAB — AMMONIA: AMMONIA: 85 umol/L — AB (ref 11–35)

## 2016-02-24 MED ORDER — METOLAZONE 2.5 MG PO TABS: 2.5000 mg | ORAL_TABLET | ORAL | Status: DC | PRN

## 2016-02-24 MED ORDER — SODIUM CHLORIDE 0.9 % IV SOLN: Freq: Once | INTRAVENOUS | Status: DC

## 2016-02-24 NOTE — Assessment & Plan Note (Addendum)
Stable? Question his fatigue is related to encephalopathy or perhaps anemia in addition to the labs otherwise outlined will get a seem at today.

## 2016-02-24 NOTE — Progress Notes (Signed)
   Subjective:    Patient ID: Hunter [name], male    DOB: [birth year], 81 y.o.   MRN: [id number] Chief complaint: Fatigue. Here for follow-up of cirrhosis in the setting of heart failure and hereditary hemorrhagic telangiectasia HPI Patient is here with his wife. She reports that he is sleeping or than usual. He does not seem overtly confused but is sleeping more and has fatigue. Epistaxis may have picked up a little bit lately and frequency. He is not having any GI bleeding signs that we know of otherwise. He has a new bruise under his left eye that is not painful and he does not know how he got it showed up yesterday. He does bruise easily. They would like a refill on the when necessary metolazone that Dr. Haroldine Laws prescribed previously. Medications, allergies, past medical history, past surgical history, family history and social history are reviewed and updated in the EMR.   Review of Systems As above    Objective:   Physical Exam  General:         NAD - appearing a bit stronger but chron ill Eyes:               anicteric Lungs:            clear Heart::            S1S2 2/6 SEM at LUSB, + JVD Abdomen:       soft and nontender, BS+, probable slight ascites Ext:                 1+ bilat pretib edema,  Neuro              alert and oriented x 3, ? 1 flap - no sig asterixis Skin:  Left infraorbital ecchymosis, + telangiectasia  Lab Results  Component Value Date   WBC 3.9* 01/21/2016   HGB 7.5* 01/21/2016   HCT 24.2* 01/21/2016   MCV 87.7 01/21/2016   PLT 65* 01/21/2016     Chemistry      Component Value Date/Time   NA 133* 01/21/2016 1507   K 3.7 01/21/2016 1507   CL 95* 01/21/2016 1507   CO2 28 01/21/2016 1507   BUN 29* 01/21/2016 1507   CREATININE 1.65* 01/21/2016 1507   CREATININE 2.11* 11/26/2015 1036      Component Value Date/Time   CALCIUM 8.6 01/21/2016 1507   ALKPHOS 129* 01/21/2016 1507   AST 29 01/21/2016 1507   ALT 17 01/21/2016 1507   BILITOT 2.3* 01/21/2016  1507          Assessment & Plan:  Cirrhosis Stable? Question his fatigue is related to encephalopathy or perhaps anemia in addition to the labs otherwise outlined will get a seem at today.  Anemia due to chronic blood loss Ferritin, CBC today. He might need transfusion or more Feraheme. Fatigue could be from his anemia.  Hepatic encephalopathy (HCC) Ammonia level today. He seems pretty good overall with question incredibly slight asterixis. Fatigue could be a component of this though it could be from anemia.   CC: Penni Homans, MD  I will also send a copy to Dr. Haroldine Laws

## 2016-02-24 NOTE — Assessment & Plan Note (Addendum)
Ammonia level today. He seems pretty good overall with question incredibly slight asterixis. Fatigue could be a component of this though it could be from anemia.

## 2016-02-24 NOTE — Progress Notes (Signed)
Quick Note:  Hgb is down 1 g and I think that is why he is so tired  Ammonia is stable as are other abnormalities or slightly worse  I recommend a transfusion of 2 U RBC each over 3 hrs with 20 mg furosemide IV between unitis  Also - have lab add ferritin to labs as I forgot to order ______

## 2016-02-24 NOTE — Patient Instructions (Signed)
  Your physician has requested that you go to the basement for lab work before leaving today.    Follow up with [place] in 3 months.    I appreciate the opportunity to care for you. Silvano Rusk, MD, [facility]

## 2016-02-24 NOTE — Assessment & Plan Note (Addendum)
Ferritin, CBC today. He might need transfusion or more Feraheme. Fatigue could be from his anemia.

## 2016-02-25 ENCOUNTER — Other Ambulatory Visit: Payer: Self-pay

## 2016-02-25 ENCOUNTER — Ambulatory Visit (HOSPITAL_COMMUNITY)
Admission: RE | Admit: 2016-02-25 | Discharge: 2016-02-25 | Disposition: A | Discharge status: Home / Self Care | Payer: Medicare Other | Source: Ambulatory Visit | Attending: Internal Medicine | Admitting: Internal Medicine

## 2016-02-25 DIAGNOSIS — D649 Anemia, unspecified: Secondary | ICD-10-CM | POA: Diagnosis present

## 2016-02-25 LAB — ABO/RH: ABO/RH(D): A POS

## 2016-02-25 LAB — FERRITIN: FERRITIN: 22.3 ng/mL (ref 22.0–322.0)

## 2016-02-25 LAB — PREPARE RBC (CROSSMATCH)

## 2016-02-25 MED ORDER — FUROSEMIDE 10 MG/ML IJ SOLN
20.0000 mg | Freq: Once | INTRAMUSCULAR | Status: AC
  Administered 2016-02-25: 20 mg via INTRAVENOUS
  Filled 2016-02-25: qty 2

## 2016-02-25 MED ORDER — SODIUM CHLORIDE 0.9 % IV SOLN
Freq: Once | INTRAVENOUS | Status: AC
  Administered 2016-02-25: 11:00:00 via INTRAVENOUS

## 2016-02-25 NOTE — Progress Notes (Signed)
PCP: Silvano Rusk MD  Associated Diagnosis: Anemia   Procedure Note: Transfused 2 units PRBC with IV Lasix between the first and second unit.  Condition: Tolerated procedure well. No reaction or any symptoms of Shortness of breath. Discharge instructions given to patient and patient understands. Discharged to home. Alert, oriented and ambulatory with walker.

## 2016-02-25 NOTE — Progress Notes (Signed)
Quick Note:  He will need feraheme X 2 to boost iron stores (do at least 1 week apart)  Iron def and chronic blood loss anemia  Can do after blood transfusion (days/wekks ______

## 2016-02-27 ENCOUNTER — Encounter: Payer: Self-pay | Admitting: Family Medicine

## 2016-02-28 ENCOUNTER — Ambulatory Visit: Payer: Medicare Other | Admitting: Family Medicine

## 2016-02-28 ENCOUNTER — Other Ambulatory Visit: Payer: Self-pay

## 2016-02-28 DIAGNOSIS — D509 Iron deficiency anemia, unspecified: Secondary | ICD-10-CM

## 2016-02-28 LAB — TYPE AND SCREEN
ABO/RH(D): A POS
ANTIBODY SCREEN: NEGATIVE
Unit division: 0
Unit division: 0

## 2016-03-01 ENCOUNTER — Telehealth: Payer: Self-pay

## 2016-03-01 ENCOUNTER — Other Ambulatory Visit (HOSPITAL_COMMUNITY): Payer: Self-pay | Admitting: Internal Medicine

## 2016-03-01 NOTE — Telephone Encounter (Signed)
He needs an appt     ----- Message -----     From: Jamesetta Orleans, CMA     Sent: 02/28/2016  7:12 AM      To: Mosie Lukes, MD    Subject: FW: Non-Urgent Medical Question                       ----- Message -----     From: [name]     Sent: 02/27/2016  8:47 PM      To: [facility] Clinical Pool    Subject: Non-Urgent Medical Question                 Hunter [name] hemoglobin had dropped to 6.5 last Thursday, on Friday he was given 2 units of blood.  Yesterday and today he has been weak and sleepy.  I feel he needs to be seen by Dr. Charlett Blake to see if anything can or should be done for him.  Hunter [name] Sunday 0000000.    Called to follow up with patient.  Wife states that patient appears to be doing somewhat better today.  Seems to have more energy.  Pt was given 2 units of blood last Friday (01/28/16) and will have an iron infusion on Monday.  Blood levels have not drawn since infusion.  Encouraged to schedule an appt. No openings tomorrow (03/02/16) with Dr. Charlett Blake.  Appt scheduled with Dr. Lorelei Pont for tomorrow (03/02/16) at 1pm.  Advised if symptoms worsen to go to ER.  Wife stated understanding and agreed.

## 2016-03-02 ENCOUNTER — Telehealth: Payer: Self-pay

## 2016-03-02 ENCOUNTER — Encounter: Payer: Self-pay | Admitting: Family Medicine

## 2016-03-02 ENCOUNTER — Ambulatory Visit (INDEPENDENT_AMBULATORY_CARE_PROVIDER_SITE_OTHER): Payer: Medicare Other | Admitting: Family Medicine

## 2016-03-02 VITALS — BP 113/62 | HR 88 | Ht 71.0 in | Wt 179.2 lb

## 2016-03-02 DIAGNOSIS — D500 Iron deficiency anemia secondary to blood loss (chronic): Secondary | ICD-10-CM

## 2016-03-02 DIAGNOSIS — E871 Hypo-osmolality and hyponatremia: Secondary | ICD-10-CM | POA: Diagnosis not present

## 2016-03-02 DIAGNOSIS — R400 Somnolence: Secondary | ICD-10-CM

## 2016-03-02 DIAGNOSIS — R4182 Altered mental status, unspecified: Secondary | ICD-10-CM | POA: Diagnosis not present

## 2016-03-02 LAB — CBC WITH DIFFERENTIAL/PLATELET
BASOS PCT: 0.3 % (ref 0.0–3.0)
Basophils Absolute: 0 10*3/uL (ref 0.0–0.1)
EOS ABS: 0 10*3/uL (ref 0.0–0.7)
Eosinophils Relative: 0.4 % (ref 0.0–5.0)
HCT: 23.3 % — CL (ref 39.0–52.0)
Lymphocytes Relative: 7.6 % — ABNORMAL LOW (ref 12.0–46.0)
Lymphs Abs: 0.3 10*3/uL — ABNORMAL LOW (ref 0.7–4.0)
MCHC: 31.5 g/dL (ref 30.0–36.0)
MCV: 82.7 fl (ref 78.0–100.0)
MONO ABS: 0.5 10*3/uL (ref 0.1–1.0)
Monocytes Relative: 13.7 % — ABNORMAL HIGH (ref 3.0–12.0)
NEUTROS ABS: 2.9 10*3/uL (ref 1.4–7.7)
Neutrophils Relative %: 78 % — ABNORMAL HIGH (ref 43.0–77.0)
PLATELETS: 59 10*3/uL — AB (ref 150.0–400.0)
RBC: 2.82 Mil/uL — ABNORMAL LOW (ref 4.22–5.81)
RDW: 18.4 % — AB (ref 11.5–15.5)
WBC: 3.7 10*3/uL — AB (ref 4.0–10.5)

## 2016-03-02 LAB — BASIC METABOLIC PANEL
BUN: 42 mg/dL — ABNORMAL HIGH (ref 6–23)
CO2: 29 mEq/L (ref 19–32)
Calcium: 8.6 mg/dL (ref 8.4–10.5)
Chloride: 96 mEq/L (ref 96–112)
Creatinine, Ser: 1.97 mg/dL — ABNORMAL HIGH (ref 0.40–1.50)
GFR: 35.09 mL/min — AB (ref >60.00)
Glucose, Bld: 137 mg/dL — ABNORMAL HIGH (ref 70–99)
POTASSIUM: 4 meq/L (ref 3.5–5.1)
SODIUM: 132 meq/L — AB (ref 135–145)

## 2016-03-02 NOTE — Patient Instructions (Signed)
I will be in touch with your labs asap!   I hope that you continue to feel better- keep [place] posted as to your progress

## 2016-03-02 NOTE — Telephone Encounter (Signed)
error 

## 2016-03-02 NOTE — Progress Notes (Addendum)
[place] at [facility] [address], [place], [place] [address] [phone]  Date:  03/02/2016   Name:  Hunter [name]   DOB:  1935-02-27   MRN:  [id number]  PCP:  Penni Homans, MD    Chief Complaint: Follow-up   History of Present Illness:  Hunter [name] is a 81 y.o. 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  He did have a transfusion (?2u) about one week ago.  His wife [name] was worried that he had not improved enough following the transfusion and called the nurse line on sunday.  He was more sleepy than usual.  He seems to be improving more toward his baseline over the last couple of days but they still wanted to check in. They do plan to do iron infusion this coming Monday  He denies any pain.  Appetite is not great but steady.   No history of UTI that they can recal He does have hepatic encephalopathy and uses lactulose twice a day for his ammonia levels.   Patient Active Problem List   Diagnosis Date Noted  . Melanoma ([place])   . Hepatic encephalopathy ([place]) 09/15/2015  . Chronic diastolic heart failure ([place])   . Palliative care encounter 08/11/2015  . Anemia 08/10/2015  . Hyponatremia   . Liver failure ([place])   . Cirrhosis ([place])   . Congestive heart disease ([place])   . Hepatic AV fistula ([place]) 08/09/2015  . Mitral regurgitation 08/09/2015  . HHT (hereditary hemorrhagic telangiectasia) ([place]) 08/09/2015  . Anemia due to chronic blood loss 08/09/2015  . Right heart failure ([place]) 08/09/2015  . 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 Tricuspid regurgitation 08/09/2015  . Chronic kidney disease 08/09/2015    Past Medical History  Diagnosis Date  . CHF (congestive heart failure) ([place])   . Pacemaker     single lead Medtronic pacemaker  . HHT (hereditary hemorrhagic telangiectasia) ([place])   . Anemia   . Pulmonary  hypertension ([place])   . 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 Renal insufficiency   . Liver failure ([place])   . Atrial fibrillation ([place])   . Hepatic AV fistula ([place]) 08/09/2015  . 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 Anemia due to chronic blood loss 08/09/2015  . Right heart failure ([place]) 08/09/2015  . Tricuspid regurgitation 08/09/2015  . 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 Melanoma ([place])     2 on nose, 1 on back, 1 on leg    Past Surgical History  Procedure Laterality Date  . Pacemaker insertion    . Colon surgery  1196  . Esophagogastroduodenoscopy N/A 08/13/2015    Procedure: ESOPHAGOGASTRODUODENOSCOPY (EGD);  Surgeon: Ladene Artist, MD;  Location: [facility] ENDOSCOPY;  Service: Endoscopy;  Laterality: N/A;  . Tonsillectomy Bilateral     Social History  Substance Use Topics  . Smoking status: Never Smoker   . Smokeless tobacco: Never Used  . Alcohol Use: No    Family History  Problem Relation Age of Onset  . Other Mother     HHT  . Other Son     HHT  . Heart failure Father   . Heart disease Sister     afib, pacer  . Arthritis Brother     b/l TKR  . Other Brother   . Heart disease Sister     pacer    No Known  Allergies  Medication list has been reviewed and updated.  Current Outpatient Prescriptions on File Prior to Visit  Medication Sig Dispense Refill  . b complex-vitamin c-folic acid (NEPHRO-VITE) 0.8 MG TABS tablet Take 1 tablet by mouth daily. 30 tablet 6  . cyanocobalamin (,VITAMIN B-12,) 1000 MCG/ML injection Inject 1 mL (1,000 mcg total) into the muscle once. 10 mL 0  . Cyanocobalamin 1000 MCG/ML KIT Inject 1 Syringe as directed every 30 (thirty) days. 1 kit 1  . digoxin (LANOXIN) 0.125 MG tablet Take 1 tablet (0.125 mg total) by mouth 3 (three) times a week. Mon, Wed, Fri 15 tablet 6  . feeding supplement, ENSURE ENLIVE, (ENSURE ENLIVE) LIQD Take 237 mLs by mouth 2 (two) times daily between meals. 237 mL 12  . ferrous sulfate 325 (65 FE) MG EC tablet Take 325 mg by  mouth 2 (two) times daily with a meal.     . KLOR-CON M20 20 MEQ tablet TAKE TWO TABLETS BY MOUTH IN THE MORNING AND ONE IN THE EVENING 90 tablet 0  . lactulose (CHRONULAC) 10 GM/15ML solution Take 45 mLs (30 g total) by mouth 2 (two) times daily. 240 mL 3  . Magnesium 250 MG TABS Take 1 tablet by mouth daily.    . metolazone (ZAROXOLYN) 2.5 MG tablet Take 1 tablet (2.5 mg total) by mouth as needed (for weight gain). 30 tablet 1  . ondansetron (ZOFRAN) 4 MG tablet Take 1 tablet (4 mg total) by mouth every 8 (eight) hours as needed for nausea or vomiting. 30 tablet 5  . pantoprazole (PROTONIX) 40 MG tablet Take 1 tablet (40 mg total) by mouth daily. 30 tablet 6  . pneumococcal 13-valent conjugate vaccine (PREVNAR 13) SUSP injection Inject 0.5 mLs into the muscle tomorrow at 10 am. 0.5 mL 0  . promethazine (PHENERGAN) 25 MG tablet Take 6.25 mg by mouth daily as needed for nausea or vomiting.     [name] spironolactone (ALDACTONE) 50 MG tablet TAKE ONE TABLET BY MOUTH ONCE DAILY 30 tablet 0  . SYRINGE-NEEDLE, DISP, 3 ML 25G X 5/8" 3 ML MISC To use with B12 injections 50 each 2  . torsemide (DEMADEX) 20 MG tablet Take 80 mg (4 tablets) in the am and 60 mg (3 tablets) in the evening 360 tablet 3  . TraMADol HCl 50 MG TBDP Take 25-50 mg by mouth at bedtime as needed. 10 tablet 0  . XIFAXAN 550 MG TABS tablet TAKE ONE TABLET BY MOUTH TWICE DAILY 60 tablet 11   Current Facility-Administered Medications on File Prior to Visit  Medication Dose Route Frequency Provider Last Rate Last Dose  . 0.9 %  sodium chloride infusion   Intravenous Once Gatha Mayer, MD        Review of Systems:  As per HPI- otherwise negative.   Physical Examination: Filed Vitals:   03/02/16 1300  BP: 113/62  Pulse: 88   Filed Vitals:   03/02/16 1300  Height: '5\' 11"'  (1.803 m)  Weight: 179 lb 3.2 oz (81.285 kg)   Body mass index is 25 kg/(m^2). Ideal Body Weight: Weight in (lb) to have BMI = 25: 178.9  GEN: WDWN, NAD,  Non-toxic, A & O x 3, chronically ill but comfortable appearing gentleman accompanied by his wife Hunter [name] today HEENT: Atraumatic, Normocephalic. Neck supple. No masses, No LAD.  Conjunctivae are a pale pink, would not expect hg less than 8-9g Ears and Nose: No external deformity. CV: RRR, No M/G/R. No JVD. No thrill. No  extra heart sounds. PULM: CTA B, no wheezes, crackles, rhonchi. No retractions. No resp. distress. No accessory muscle use. ABD: S, NT, ND. EXTR: No c/c/e NEURO Normal gait for pt- slow, uses a walker PSYCH: Normally interactive.   Assessment and Plan: Anemia due to chronic blood loss - Plan: CBC with Differential/Platelet  Hyponatremia - Plan: Basic metabolic panel  Somnolence - Plan: Urine culture  Altered mental status, unspecified altered mental status type - Plan: Urine culture  Here today to follow-up on anemia s/p recent transfusion. Will also check a BMP and urine culture to look for any other cause of his sleepiness.  His wife will let me know if any worsening in his condition or other changes   Signed Lamar Blinks, MD

## 2016-03-03 ENCOUNTER — Other Ambulatory Visit (HOSPITAL_COMMUNITY): Payer: Self-pay | Admitting: *Deleted

## 2016-03-04 LAB — URINE CULTURE
Colony Count: NO GROWTH
Organism ID, Bacteria: NO GROWTH

## 2016-03-06 ENCOUNTER — Other Ambulatory Visit (HOSPITAL_COMMUNITY): Payer: Self-pay | Admitting: Internal Medicine

## 2016-03-06 ENCOUNTER — Encounter (HOSPITAL_COMMUNITY)
Admission: RE | Admit: 2016-03-06 | Discharge: 2016-03-06 | Disposition: A | Discharge status: Home / Self Care | Payer: Medicare Other | Source: Ambulatory Visit | Attending: Internal Medicine | Admitting: Internal Medicine

## 2016-03-06 VITALS — BP 110/50 | HR 80 | Temp 97.9°F | Resp 20 | Ht 71.0 in | Wt 173.0 lb

## 2016-03-06 DIAGNOSIS — D509 Iron deficiency anemia, unspecified: Secondary | ICD-10-CM | POA: Insufficient documentation

## 2016-03-06 MED ORDER — SODIUM CHLORIDE 0.9 % IV SOLN
510.0000 mg | INTRAVENOUS | Status: DC
  Administered 2016-03-06: 510 mg via INTRAVENOUS
  Filled 2016-03-06: qty 17

## 2016-03-09 ENCOUNTER — Other Ambulatory Visit: Payer: Self-pay | Admitting: Family Medicine

## 2016-03-09 ENCOUNTER — Encounter: Payer: Self-pay | Admitting: Family Medicine

## 2016-03-09 MED ORDER — SYRINGE/NEEDLE (DISP) 25G X 5/8" 3 ML MISC
Status: AC
Start: 2016-03-09 — End: ?

## 2016-03-13 ENCOUNTER — Ambulatory Visit (HOSPITAL_COMMUNITY)
Admission: RE | Admit: 2016-03-13 | Discharge: 2016-03-13 | Disposition: A | Discharge status: Home / Self Care | Payer: Medicare Other | Source: Ambulatory Visit | Attending: Internal Medicine | Admitting: Internal Medicine

## 2016-03-13 VITALS — BP 97/55 | HR 80 | Resp 20 | Ht 71.0 in | Wt 175.0 lb

## 2016-03-13 DIAGNOSIS — D509 Iron deficiency anemia, unspecified: Secondary | ICD-10-CM | POA: Diagnosis present

## 2016-03-13 MED ORDER — SODIUM CHLORIDE 0.9 % IV SOLN
510.0000 mg | INTRAVENOUS | Status: DC
  Administered 2016-03-13: 510 mg via INTRAVENOUS
  Filled 2016-03-13: qty 17

## 2016-03-16 ENCOUNTER — Other Ambulatory Visit (HOSPITAL_COMMUNITY): Payer: Self-pay | Admitting: Internal Medicine

## 2016-03-20 ENCOUNTER — Ambulatory Visit (HOSPITAL_COMMUNITY)
Admission: RE | Admit: 2016-03-20 | Discharge: 2016-03-20 | Disposition: A | Discharge status: Home / Self Care | Payer: Medicare Other | Source: Ambulatory Visit | Attending: Internal Medicine | Admitting: Internal Medicine

## 2016-03-20 ENCOUNTER — Encounter (HOSPITAL_COMMUNITY): Payer: Self-pay | Admitting: Internal Medicine

## 2016-03-20 VITALS — BP 117/50 | HR 94 | Resp 20 | Wt 180.2 lb

## 2016-03-20 DIAGNOSIS — Z85820 Personal history of malignant melanoma of skin: Secondary | ICD-10-CM | POA: Diagnosis not present

## 2016-03-20 DIAGNOSIS — I780 Hereditary hemorrhagic telangiectasia: Secondary | ICD-10-CM | POA: Diagnosis not present

## 2016-03-20 DIAGNOSIS — K7290 Hepatic failure, unspecified without coma: Secondary | ICD-10-CM | POA: Diagnosis not present

## 2016-03-20 DIAGNOSIS — Z79899 Other long term (current) drug therapy: Secondary | ICD-10-CM | POA: Diagnosis not present

## 2016-03-20 DIAGNOSIS — I5032 Chronic diastolic (congestive) heart failure: Secondary | ICD-10-CM | POA: Diagnosis not present

## 2016-03-20 DIAGNOSIS — I272 Other secondary pulmonary hypertension: Secondary | ICD-10-CM | POA: Insufficient documentation

## 2016-03-20 DIAGNOSIS — I4891 Unspecified atrial fibrillation: Secondary | ICD-10-CM | POA: Diagnosis not present

## 2016-03-20 DIAGNOSIS — Z8249 Family history of ischemic heart disease and other diseases of the circulatory system: Secondary | ICD-10-CM | POA: Diagnosis not present

## 2016-03-20 DIAGNOSIS — K7682 Hepatic encephalopathy: Secondary | ICD-10-CM

## 2016-03-20 DIAGNOSIS — D649 Anemia, unspecified: Secondary | ICD-10-CM | POA: Diagnosis not present

## 2016-03-20 DIAGNOSIS — N183 Chronic kidney disease, stage 3 (moderate): Secondary | ICD-10-CM | POA: Insufficient documentation

## 2016-03-20 DIAGNOSIS — Z950 Presence of cardiac pacemaker: Secondary | ICD-10-CM | POA: Insufficient documentation

## 2016-03-20 DIAGNOSIS — D500 Iron deficiency anemia secondary to blood loss (chronic): Secondary | ICD-10-CM

## 2016-03-20 LAB — CBC
HCT: 25.2 % — ABNORMAL LOW (ref 39.0–52.0)
HEMOGLOBIN: 7.6 g/dL — AB (ref 13.0–17.0)
MCH: 27.8 pg (ref 26.0–34.0)
MCHC: 30.2 g/dL (ref 30.0–36.0)
MCV: 92.3 fL (ref 78.0–100.0)
PLATELETS: 60 10*3/uL — AB (ref 150–400)
RBC: 2.73 MIL/uL — AB (ref 4.22–5.81)
RDW: 26.5 % — ABNORMAL HIGH (ref 11.5–15.5)
WBC: 4.3 10*3/uL (ref 4.0–10.5)

## 2016-03-20 LAB — BASIC METABOLIC PANEL
Anion gap: 14 (ref 5–15)
BUN: 43 mg/dL — ABNORMAL HIGH (ref 6–20)
CALCIUM: 8.6 mg/dL — AB (ref 8.9–10.3)
CHLORIDE: 96 mmol/L — AB (ref 101–111)
CO2: 23 mmol/L (ref 22–32)
CREATININE: 2.32 mg/dL — AB (ref 0.61–1.24)
GFR calc Af Amer: 29 mL/min — ABNORMAL LOW (ref >60)
GFR, EST NON AFRICAN AMERICAN: 25 mL/min — AB (ref >60)
GLUCOSE: 174 mg/dL — AB (ref 65–99)
POTASSIUM: 3.7 mmol/L (ref 3.5–5.1)
Sodium: 133 mmol/L — ABNORMAL LOW (ref 135–145)

## 2016-03-20 LAB — AMMONIA: Ammonia: 93 umol/L — ABNORMAL HIGH (ref 9–35)

## 2016-03-20 MED ORDER — TORSEMIDE 20 MG PO TABS: 80.0000 mg | ORAL_TABLET | Freq: Two times a day (BID) | ORAL | Status: DC

## 2016-03-20 MED ORDER — SPIRONOLACTONE 50 MG PO TABS: ORAL_TABLET | ORAL | Status: AC

## 2016-03-20 NOTE — Patient Instructions (Addendum)
INCREASE Torsemide to 80 mg (4 tabs) twice daily.  INCREASE Spironolactone to 50 mg (1 tab) in am and 25 mg (1/2 tab) in pm.  TAKE metolazone 1 tablet once daily today and tomorrow.  Routine lab work today. Will notify you of abnormal results, otherwise no news is good news!  Follow up 1 week.  Do the following things EVERYDAY: 1) Weigh yourself in the morning before breakfast. Write it down and keep it in a log. 2) Take your medicines as prescribed 3) Eat low salt foods-Limit salt (sodium) to 2000 mg per day.  4) Stay as active as you can everyday 5) Limit all fluids for the day to less than 2 liters

## 2016-03-20 NOTE — Progress Notes (Signed)
Patient ID: Hunter [name], male   DOB: 1935-07-24, 81 y.o.   MRN: [id number]   Advanced Heart Failure Clinic Note   Patient ID: Hunter [name], male   DOB: 12-Sep-1935, 81 y.o.   MRN: [id number]  Primary Cardiologist: Dr. Claude Manges, in [place], had also been acting as PCP Primary HF: Dr. Haroldine Laws  PCP: Randel Pigg  HPI: Hunter [name] is a 81 y.o. male with a history of diastolic HF Echo 1/94/17 EF 45-50% NYHA class III, pulmonary hypertension, liver failure, CKD stage III, and hereditary hemorrhagic telangiectasia with hepatic AVMs and associated anemia requiring transfusion.Hunter [name]  He was admitted to [facility] on 8/30 after labs from [facility] on 8/29 showed marked anemia with Hemoglobin of 5 and hyponatremia. He was transfused and also diuresed. EGD showed AVMs which were treated.  Weight on discharge 177 lbs.  He presents today for follow up with his wife. Over the last couple of weeks he has had 2PRBCS and feraheme. Has been doing ok at home. Wife helps him tremendously. Weight at home 171-176.He takes metolazone 1-2 times a week. Appetite improving. Denies SOB. Denies PND/Orthopnea.  Ambulates with a walker. Completed HH.Taking all medications.  Followed closely by Dr Charlett Blake.   Echo 08/11/15 45-50 %. RV normal. Marked biatrial enlargement with probable restrictive filling pattern. Moderate to severe MR. Moderate TR. PA peak pressure 51 mm Hg.  Labs: 09/15/15: Creatinine 1.77, K 3.2  11/15/15: K 3.9 Cr 1.89  Hgb 7.4 02/24/2016: K 3.8 Creatinine 1.98 Hgb 6.5   Records from [place]: Echo 3/16 EF 50-55%, Grade 2 DD, RV severely dilated Echo 3/15 EF 55-60%, mod enlarged RV, RV systolic function low normal. Increased LV filling pressures, Moderate MR  RHC 01/19/15 RA 24/18, (20) RV 45/20 PA 45/30 (35) PCW  mean 30    Past Medical History  Diagnosis Date  . CHF (congestive heart failure) ([place])   . Pacemaker     single lead Medtronic pacemaker  . HHT (hereditary hemorrhagic telangiectasia) ([place])   .  Anemia   . Pulmonary hypertension ([place])   . 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 Renal insufficiency   . Liver failure ([place])   . Atrial fibrillation ([place])   . Hepatic AV fistula ([place]) 08/09/2015  . 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 Anemia due to chronic blood loss 08/09/2015  . Right heart failure ([place]) 08/09/2015  . Tricuspid regurgitation 08/09/2015  . 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 Melanoma ([place])     2 on nose, 1 on back, 1 on leg    Current Outpatient Prescriptions  Medication Sig Dispense Refill  . b complex-vitamin c-folic acid (NEPHRO-VITE) 0.8 MG TABS tablet Take 1 tablet by mouth daily. 30 tablet 6  . Cyanocobalamin 1000 MCG/ML KIT Inject 1 Syringe as directed every 30 (thirty) days. 1 kit 1  . digoxin (LANOXIN) 0.125 MG tablet Take 1 tablet (0.125 mg total) by mouth 3 (three) times a week. Mon, Wed, Fri 15 tablet 6  . feeding supplement, ENSURE ENLIVE, (ENSURE ENLIVE) LIQD Take 237 mLs by mouth 2 (two) times daily between meals. 237 mL 12  . ferrous sulfate 325 (65 FE) MG EC tablet Take 325 mg by mouth 2 (two) times daily with a meal.     . KLOR-CON M20 20 MEQ tablet TAKE TWO TABLETS BY MOUTH IN THE MORNING AND ONE IN THE EVENING 90 tablet 0  . lactulose (CHRONULAC) 10 GM/15ML solution Take 45 mLs (30 g total) by mouth 2 (two) times daily. 240 mL 3  .  Magnesium 250 MG TABS Take 1 tablet by mouth daily.    . metolazone (ZAROXOLYN) 2.5 MG tablet Take 1 tablet (2.5 mg total) by mouth as needed (for weight gain). 30 tablet 1  . ondansetron (ZOFRAN) 4 MG tablet Take 1 tablet (4 mg total) by mouth every 8 (eight) hours as needed for nausea or vomiting. 30 tablet 5  . pantoprazole (PROTONIX) 40 MG tablet Take 1 tablet (40 mg total) by mouth daily. 30 tablet 6  . spironolactone (ALDACTONE) 50 MG tablet TAKE ONE TABLET BY MOUTH ONCE DAILY 30 tablet 0  . torsemide (DEMADEX) 20 MG tablet Take 80 mg (4 tablets) in the am and 60 mg (3 tablets) in the evening 360 tablet 3  . XIFAXAN 550 MG  TABS tablet TAKE ONE TABLET BY MOUTH TWICE DAILY 60 tablet 11  . cyanocobalamin (,VITAMIN B-12,) 1000 MCG/ML injection Inject 1 mL (1,000 mcg total) into the muscle once. 10 mL 0  . pneumococcal 13-valent conjugate vaccine (PREVNAR 13) SUSP injection Inject 0.5 mLs into the muscle tomorrow at 10 am. 0.5 mL 0  . promethazine (PHENERGAN) 25 MG tablet Take 6.25 mg by mouth daily as needed for nausea or vomiting.     Hunter [name] SYRINGE-NEEDLE, DISP, 3 ML 25G X 5/8" 3 ML MISC To use with B12 injections 50 each 2  . TraMADol HCl 50 MG TBDP Take 25-50 mg by mouth at bedtime as needed. 10 tablet 0   Current Facility-Administered Medications  Medication Dose Route Frequency Provider Last Rate Last Dose  . 0.9 %  sodium chloride infusion   Intravenous Once Gatha Mayer, MD        No Known Allergies    Social History   Social History  . Marital Status: Married    Spouse Name: N/A  . Number of Children: 1  . Years of Education: N/A   Occupational History  . retired    Social History Main Topics  . Smoking status: Never Smoker   . Smokeless tobacco: Never Used  . Alcohol Use: No  . Drug Use: No  . 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 Not on file   Social History Narrative   Married 1 son   Retired Therapist, art   One caffeinated beverage a day no alcohol   He was living in [employer] at the [place] on [address] met in [place] and has located to [place] recently.   08/09/2015         Family History  Problem Relation Age of Onset  . Other Mother     HHT  . Other Son     HHT  . Heart failure Father   . Heart disease Sister     afib, pacer  . Arthritis Brother     b/l TKR  . Other Brother   . Heart disease Sister     pacer    Filed Vitals:   03/20/16 1131  BP: 117/50  Pulse: 94  Resp: 20  Weight: 180 lb 4 oz (81.761 kg)    SpO2: 100%    PHYSICAL EXAM: General: Elderly, NAD HEENT: Diffuse telengectasias.  Neck: supple. JVP 10-11.  Carotids 2+ bilat; no bruits. No lymphadenopathy or thryomegaly. Cor: PMI nondisplaced. Regular rate & rhythm. II/VI SEM at LUSB and apex Lungs: Diminished air movement in the bases L>R  Abdomen: soft, NT, mod distention. No HSM appreciated. No bruits or masses. +BS Extremities: no cyanosis, clubbing, rash. 3+ LE edema. + asterixis Neuro: alert & orientedx3, cranial nerves grossly intact. moves all 4 extremities w/o difficulty. Affect flat.    ASSESSMENT & PLAN:  1. Chronic diastolic CHF Echo 2/86/38 45-50%.  2. Pulmonary hypertension on Echo 08/11/15 PA peak pressure 51 mm Hg 3. Liver failure / cirrhosis 4. CKD Stage III 5. Chronic Anemia 6. Hereditary telangiectasia. 7. Hepatic encephalopathy   NYHA III. Volume status elevated. Increase torsemide 80 mg/68m and increase metolazone daily for 2 days. Increase spiro 50 mg in and add 25 mg in pm.    Follow up next week to reassess volume status. May need paracentesis.   Check CBC, BMET , ammonia today.    Follow up next week .  ADarrick Grinder NP-C  11:54 AM  Patient seen and examined with ADarrick Grinder NP. We discussed all aspects of the encounter. I agree with the assessment and plan as stated above.   Doing ok. Volume up some with ascites on board and also seems to be mildly encephalopathic. Will change diuretics as above. Check CBC and ammonia. See back 1 week. If has persistent ascites may require paracentesis soon.   Bensimhon, Daniel,MD 9:35 PM

## 2016-03-20 NOTE — Progress Notes (Signed)
Advanced Heart Failure Medication Review by a Pharmacist  Does the patient  feel that his/her medications are working for him/her?  yes  Has the patient been experiencing any side effects to the medications prescribed?  no  Does the patient measure his/her own blood pressure or blood glucose at home?  no   Does the patient have any problems obtaining medications due to transportation or finances?   yes  Understanding of regimen: excellent Understanding of indications: excellent Potential of compliance: excellent Patient understands to avoid NSAIDs. Patient understands to avoid decongestants.  Issues to address at subsequent visits: insurance coverage   Pharmacist comments:  Hunter [name] is a 81 yo man here for HF clinic visit. His wife manages all of his medications. No missed doses or side effects noted. Weighs self in AM daily and takes metolazone for wt > 175 lbs, pt has been eating better lately. Last metolazone dose on Friday. Uses ondansetron approximately twice a week to help with N/V prior to meals. Promethazine given if ondansetron not effective, and pt has not had to use promethazine in a while.  Hunter [name] wife noted that Xifaxan dose is extremely expensive (~$1100/mo). He currently does not have Rx insurance & pays out of pocket for medications. Hunter [name] is currently in process for Part D enrollment and declined referral to [facility]. Household annual income does not qualify them for any patient assistance programs per wife.   [name], [place].D., BCPS PGY2 Cardiology Pharmacy Resident Pager: [phone]   Time with patient: 10 min Preparation and documentation time: 5 min Total time: 15  min

## 2016-03-21 NOTE — Progress Notes (Signed)
Quick Note:  Discussed w/ wife  He is swollen and just a little shaky but not confused Stronger overall since blood and iron infusions  Will increase lactulose by adding third dose w/ 2-3 tbsp and she prefers to f/u as planned w/ Dr. Haroldine Laws in 5 d unless worsening I told her CHF issues could drive a need for hospitalization ______

## 2016-03-27 ENCOUNTER — Ambulatory Visit (HOSPITAL_COMMUNITY)
Admission: RE | Admit: 2016-03-27 | Discharge: 2016-03-27 | Disposition: A | Discharge status: Home / Self Care | Payer: Medicare Other | Source: Ambulatory Visit | Attending: Internal Medicine | Admitting: Internal Medicine

## 2016-03-27 ENCOUNTER — Encounter (HOSPITAL_COMMUNITY): Payer: Self-pay | Admitting: Internal Medicine

## 2016-03-27 VITALS — BP 121/58 | HR 90 | Resp 22 | Wt 175.5 lb

## 2016-03-27 DIAGNOSIS — D649 Anemia, unspecified: Secondary | ICD-10-CM | POA: Diagnosis not present

## 2016-03-27 DIAGNOSIS — R531 Weakness: Secondary | ICD-10-CM

## 2016-03-27 DIAGNOSIS — Z950 Presence of cardiac pacemaker: Secondary | ICD-10-CM | POA: Diagnosis not present

## 2016-03-27 DIAGNOSIS — Z8249 Family history of ischemic heart disease and other diseases of the circulatory system: Secondary | ICD-10-CM | POA: Insufficient documentation

## 2016-03-27 DIAGNOSIS — K7290 Hepatic failure, unspecified without coma: Secondary | ICD-10-CM

## 2016-03-27 DIAGNOSIS — K7469 Other cirrhosis of liver: Secondary | ICD-10-CM | POA: Diagnosis not present

## 2016-03-27 DIAGNOSIS — D500 Iron deficiency anemia secondary to blood loss (chronic): Secondary | ICD-10-CM

## 2016-03-27 DIAGNOSIS — I780 Hereditary hemorrhagic telangiectasia: Secondary | ICD-10-CM | POA: Insufficient documentation

## 2016-03-27 DIAGNOSIS — I272 Other secondary pulmonary hypertension: Secondary | ICD-10-CM | POA: Insufficient documentation

## 2016-03-27 DIAGNOSIS — Z85820 Personal history of malignant melanoma of skin: Secondary | ICD-10-CM | POA: Diagnosis not present

## 2016-03-27 DIAGNOSIS — Z79899 Other long term (current) drug therapy: Secondary | ICD-10-CM | POA: Insufficient documentation

## 2016-03-27 DIAGNOSIS — N184 Chronic kidney disease, stage 4 (severe): Secondary | ICD-10-CM

## 2016-03-27 DIAGNOSIS — I5032 Chronic diastolic (congestive) heart failure: Secondary | ICD-10-CM

## 2016-03-27 DIAGNOSIS — I4891 Unspecified atrial fibrillation: Secondary | ICD-10-CM | POA: Insufficient documentation

## 2016-03-27 DIAGNOSIS — K7682 Hepatic encephalopathy: Secondary | ICD-10-CM

## 2016-03-27 DIAGNOSIS — N183 Chronic kidney disease, stage 3 (moderate): Secondary | ICD-10-CM | POA: Insufficient documentation

## 2016-03-27 LAB — CBC
HEMATOCRIT: 24.2 % — AB (ref 39.0–52.0)
HEMOGLOBIN: 7.6 g/dL — AB (ref 13.0–17.0)
MCH: 28.8 pg (ref 26.0–34.0)
MCHC: 31.4 g/dL (ref 30.0–36.0)
MCV: 91.7 fL (ref 78.0–100.0)
Platelets: 71 10*3/uL — ABNORMAL LOW (ref 150–400)
RBC: 2.64 MIL/uL — ABNORMAL LOW (ref 4.22–5.81)
RDW: 24.4 % — AB (ref 11.5–15.5)
WBC: 4.6 10*3/uL (ref 4.0–10.5)

## 2016-03-27 LAB — BASIC METABOLIC PANEL
ANION GAP: 10 (ref 5–15)
BUN: 45 mg/dL — AB (ref 6–20)
CALCIUM: 8.5 mg/dL — AB (ref 8.9–10.3)
CO2: 22 mmol/L (ref 22–32)
Chloride: 98 mmol/L — ABNORMAL LOW (ref 101–111)
Creatinine, Ser: 2.2 mg/dL — ABNORMAL HIGH (ref 0.61–1.24)
GFR calc Af Amer: 31 mL/min — ABNORMAL LOW (ref >60)
GFR calc non Af Amer: 27 mL/min — ABNORMAL LOW (ref >60)
GLUCOSE: 171 mg/dL — AB (ref 65–99)
POTASSIUM: 4.4 mmol/L (ref 3.5–5.1)
Sodium: 130 mmol/L — ABNORMAL LOW (ref 135–145)

## 2016-03-27 LAB — AMMONIA: Ammonia: 95 umol/L — ABNORMAL HIGH (ref 9–35)

## 2016-03-27 MED ORDER — METOLAZONE 2.5 MG PO TABS: 2.5000 mg | ORAL_TABLET | ORAL | Status: AC | PRN

## 2016-03-27 MED ORDER — POTASSIUM CHLORIDE CRYS ER 20 MEQ PO TBCR: EXTENDED_RELEASE_TABLET | ORAL | Status: DC

## 2016-03-27 NOTE — Progress Notes (Signed)
Advanced Heart Failure Clinic Note   Patient ID: Hunter [name], male   DOB: 06/22/1935, 80 y.o.   MRN: [id number]  Primary Cardiologist: Dr. John Lytash, in [place], had also been acting as PCP Primary HF: Dr. Bensimhon  PCP: [facility]  HPI: Hunter [name] is a 80 y.o. male with a history of diastolic HF Echo 08/11/15 EF 45-50% NYHA class III, pulmonary hypertension, liver failure, CKD stage III, and hereditary hemorrhagic telangiectasia with hepatic AVMs and associated anemia requiring transfusion..  He was admitted to [facility] on 8/30 after labs from [facility] on 8/29 showed marked anemia with Hemoglobin of 5 and hyponatremia. He was transfused and also diuresed. EGD showed AVMs which were treated.  Weight on discharge 177 lbs.  He presents today for follow up with his wife. At last visit was volume overloaded. Torsemide increased with 2 days of metolazone. Spiro also increased. He is down 5 lbs since last week. Lactulose increased by Dr. Gessner with elevated ammonia.  Weight at home down to 170.5. Hasn't had an additional metolazone since those 2 doses. Has been doing better per wife.  Did eat a full Easter Supper yesterday with high sodium. Denies SOB. No PND/Orthopnea. Getting around with a walker. Taking all medications.  Wife feels like he was a lot stronger when/after working with PT.   Echo 08/11/15 45-50 %. RV normal. Marked biatrial enlargement with probable restrictive filling pattern. Moderate to severe MR. Moderate TR. PA peak pressure 51 mm Hg.  Labs: 09/15/15: Creatinine 1.77, K 3.2  11/15/15: K 3.9 Cr 1.89  Hgb 7.4 02/24/2016: K 3.8 Creatinine 1.98 Hgb 6.5  03/20/16: K 3.7, Creatinine 2.32, Ammonia 93, Hgb 7.6  Records from [place]: Echo 3/16 EF 50-55%, Grade 2 DD, RV severely dilated Echo 3/15 EF 55-60%, mod enlarged RV, RV systolic function low normal. Increased LV filling pressures, Moderate MR  RHC 01/19/15 RA 24/18, (20) RV 45/20 PA 45/30 (35) PCW  mean 30    Past Medical  History  Diagnosis Date  . CHF (congestive heart failure) (HCC)   . Pacemaker     single lead Medtronic pacemaker  . HHT (hereditary hemorrhagic telangiectasia) (HCC)   . Anemia   . Pulmonary hypertension (HCC)   . Osler-Weber-Rendu syndrome (HCC)   . Renal insufficiency   . Liver failure (HCC)   . Atrial fibrillation (HCC)   . Hepatic AV fistula (HCC) 08/09/2015  . 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 Anemia due to chronic blood loss 08/09/2015  . Right heart failure (HCC) 08/09/2015  . Tricuspid regurgitation 08/09/2015  . Osler-Weber-Rendu syndrome (HCC)   . Melanoma (HCC)     2 on nose, 1 on back, 1 on leg    Current Outpatient Prescriptions  Medication Sig Dispense Refill  . b complex-vitamin c-folic acid (NEPHRO-VITE) 0.8 MG TABS tablet Take 1 tablet by mouth daily. 30 tablet 6  . cyanocobalamin (,VITAMIN B-12,) 1000 MCG/ML injection Inject 1,000 mcg into the muscle every 30 (thirty) days.    . digoxin (LANOXIN) 0.125 MG tablet Take 1 tablet (0.125 mg total) by mouth 3 (three) times a week. Mon, Wed, Fri 15 tablet 6  . feeding supplement, ENSURE ENLIVE, (ENSURE ENLIVE) LIQD Take 237 mLs by mouth 2 (two) times daily between meals. 237 mL 12  . ferrous sulfate 325 (65 FE) MG EC tablet Take 325 mg by mouth 2 (two) times daily with a meal.     . KLOR-CON M20 20 MEQ tablet TAKE TWO TABLETS BY MOUTH IN THE MORNING   AND ONE IN THE EVENING 90 tablet 0  . lactulose (CHRONULAC) 10 GM/15ML solution Take 45 mLs (30 g total) by mouth 2 (two) times daily. 240 mL 3  . Magnesium 250 MG TABS Take 1 tablet by mouth daily.    . metolazone (ZAROXOLYN) 2.5 MG tablet Take 1 tablet (2.5 mg total) by mouth as needed (for weight gain). 30 tablet 1  . ondansetron (ZOFRAN) 4 MG tablet Take 1 tablet (4 mg total) by mouth every 8 (eight) hours as needed for nausea or vomiting. 30 tablet 5  . pantoprazole (PROTONIX) 40 MG tablet Take 1 tablet (40 mg total) by mouth daily. 30 tablet 6  . spironolactone (ALDACTONE) 50  MG tablet Take 50 mg (1 tab) in am and 25 mg (1/2 tab) in pm. 45 tablet 6  . SYRINGE-NEEDLE, DISP, 3 ML 25G X 5/8" 3 ML MISC To use with B12 injections 50 each 2  . torsemide (DEMADEX) 20 MG tablet Take 4 tablets (80 mg total) by mouth 2 (two) times daily. 360 tablet 3  . traMADol (ULTRAM) 50 MG tablet Take 50 mg by mouth at bedtime as needed for moderate pain.    . XIFAXAN 550 MG TABS tablet TAKE ONE TABLET BY MOUTH TWICE DAILY 60 tablet 11  . promethazine (PHENERGAN) 25 MG tablet Take 6.25 mg by mouth daily as needed for nausea or vomiting. Reported on 03/27/2016     Current Facility-Administered Medications  Medication Dose Route Frequency Provider Last Rate Last Dose  . 0.9 %  sodium chloride infusion   Intravenous Once Carl E Gessner, MD        No Known Allergies    Social History   Social History  . Marital Status: Married    Spouse Name: N/A  . Number of Children: 1  . Years of Education: N/A   Occupational History  . retired    Social History Main Topics  . Smoking status: Never Smoker   . Smokeless tobacco: Never Used  . Alcohol Use: No  . Drug Use: No  . Sexual Activity: Yes     Comment: lives with wife,  retired from consulting work in auto, electrical engineering, no dietary restrictions,    Other Topics Concern  . Not on file   Social History Narrative   Married 1 son   Retired electrical engineer and consultant   One caffeinated beverage a day no alcohol   He was living in [place] at the [facility] [address] [place] and has located to [place] recently.   08/09/2015         Family History  Problem Relation Age of Onset  . Other Mother     HHT  . Other Son     HHT  . Heart failure Father   . Heart disease Sister     afib, pacer  . Arthritis Brother     b/l TKR  . Other Brother   . Heart disease Sister     pacer    Filed Vitals:   03/27/16 1017  BP: 121/58  Pulse: 90  Resp: 22  Weight: 175 lb 8 oz (79.606 kg)  SpO2: 100%    Wt Readings from Last 3 Encounters:  03/27/16 175 lb 8 oz (79.606 kg)  03/20/16 180 lb 4 oz (81.761 kg)  03/13/16 175 lb (79.379 kg)     PHYSICAL EXAM: General: Elderly, NAD HEENT: Diffuse telengectasias.  Neck: supple. JVP 8-9.  Carotids 2+ bilat; no bruits.   No lymphadenopathy or thryomegaly. Cor: PMI nondisplaced. Regular rate & rhythm. II/VI SEM at LUSB and apex Lungs: Diminished basilar L>R Abdomen: soft, NT, mod distention. No HSM appreciated. No bruits or masses. +BS Extremities: no cyanosis, clubbing, rash. 2+ LE edema up into thighs. + asterixis Neuro: alert & orientedx3, cranial nerves grossly intact. moves all 4 extremities w/o difficulty. Affect flat.    ASSESSMENT & PLAN:  1. Chronic diastolic CHF Echo 0/94/70 45-50%.  2. Pulmonary hypertension on Echo 08/11/15 PA peak pressure 51 mm Hg 3. Liver failure / cirrhosis 4. CKD Stage III 5. Chronic Anemia 6. Hereditary telangiectasia. 7. Hepatic encephalopathy   NYHA III. Volume status remains elevated. Continue torsemide 80 mg BID and take metolazone 2.5 mg once weekly for weights of 171 lbs or greater with extra 20 meq of potassium. (He is currently taking 84mq/20meq of K daily) Continue spiro 50 mg/25 mg for now. May adjust further pending BMET.   Will see if we can get PT to come out and work with him again.   Repeat CBC, BMET, and ammonia today.   Follow up 3-4 weeks.   MShirley Friar PA-C  10:32 AM   Patient seen and examined with AOda Kilts PA-C. We discussed all aspects of the encounter. I agree with the assessment and plan as stated above. _0  Volume status improved since last visit. Will tak metolazone as needed to protect weight of 168-171. Can increase spiro to 50 bid as neeeded to help with diuresis and spare K+. He seems more encephalopathic today. Will recheck labs. D/w Dr. [facility] as well.   Bensimhon, Daniel,MD 12:17 AM

## 2016-03-27 NOTE — Progress Notes (Signed)
Advanced Heart Failure Medication Review by a Pharmacist  Does the patient  feel that his/her medications are working for him/her?  yes  Has the patient been experiencing any side effects to the medications prescribed?  no  Does the patient measure his/her own blood pressure or blood glucose at home?  yes   Does the patient have any problems obtaining medications due to transportation or finances?   no  Understanding of regimen: good Understanding of indications: good Potential of compliance: good Patient understands to avoid NSAIDs. Patient understands to avoid decongestants.  Issues to address at subsequent visits: None   Pharmacist comments:  Mr. Rodeman is a pleasant 81 yo M presenting with his wife and a current medication list. They report good compliance with his regimen. They did state that he has some nausea/vomiting especially with use of his lactulose. I've recommended taking it with a snack or some milk. They did not have any other specific medication-related questions or concerns for me at this time.   Ruta Hinds. Velva Harman, PharmD, BCPS, CPP Clinical Pharmacist Pager: [phone] Phone: [phone] 03/27/2016 10:30 AM      Time with patient: 8 minutes Preparation and documentation time: 2 minutes Total time: 10 minutes

## 2016-03-27 NOTE — Patient Instructions (Signed)
Take Metolazone for weight 171 lb or greater  When you take a Metolazone take an extra 20 meq (1 tab) of Potassium  You have been referred to Physical Therapy with [facility] physician recommends that you schedule a follow-up appointment in: 3-4 weeks

## 2016-03-28 ENCOUNTER — Encounter (HOSPITAL_COMMUNITY): Payer: Self-pay | Admitting: Internal Medicine

## 2016-03-29 ENCOUNTER — Other Ambulatory Visit (HOSPITAL_COMMUNITY): Payer: Self-pay | Admitting: Internal Medicine

## 2016-03-30 NOTE — Addendum Note (Signed)
Encounter addended by: Scarlette Calico, RN on: 03/30/2016  5:12 PM<BR>     Documentation filed: [employer], Orders

## 2016-03-31 ENCOUNTER — Telehealth (HOSPITAL_COMMUNITY): Payer: Self-pay | Admitting: *Deleted

## 2016-03-31 NOTE — Telephone Encounter (Signed)
[facility] physical therapist called requesting an order for medical social work to come out and see the pt and family.  I gave a verbal order to proceed with social work .

## 2016-04-20 ENCOUNTER — Other Ambulatory Visit (HOSPITAL_COMMUNITY): Payer: Self-pay | Admitting: Internal Medicine

## 2016-04-21 ENCOUNTER — Encounter (HOSPITAL_COMMUNITY): Payer: Self-pay | Admitting: Vascular Surgery

## 2016-04-21 ENCOUNTER — Observation Stay (HOSPITAL_COMMUNITY)
Admission: EM | Admit: 2016-04-21 | Discharge: 2016-04-23 | Disposition: A | Discharge status: Home / Self Care | Payer: Medicare Other | Attending: Internal Medicine | Admitting: Internal Medicine

## 2016-04-21 ENCOUNTER — Ambulatory Visit (HOSPITAL_BASED_OUTPATIENT_CLINIC_OR_DEPARTMENT_OTHER)
Admission: RE | Admit: 2016-04-21 | Discharge: 2016-04-21 | Disposition: A | Discharge status: Home / Self Care | Payer: Medicare Other | Source: Ambulatory Visit | Attending: Internal Medicine | Admitting: Internal Medicine

## 2016-04-21 ENCOUNTER — Emergency Department (HOSPITAL_COMMUNITY): Payer: Medicare Other

## 2016-04-21 ENCOUNTER — Encounter (HOSPITAL_COMMUNITY): Payer: Self-pay | Admitting: Internal Medicine

## 2016-04-21 ENCOUNTER — Other Ambulatory Visit: Payer: Self-pay

## 2016-04-21 ENCOUNTER — Telehealth (HOSPITAL_COMMUNITY): Payer: Self-pay | Admitting: *Deleted

## 2016-04-21 VITALS — BP 102/58 | HR 101 | Wt 171.8 lb

## 2016-04-21 DIAGNOSIS — K7290 Hepatic failure, unspecified without coma: Secondary | ICD-10-CM | POA: Diagnosis not present

## 2016-04-21 DIAGNOSIS — I50810 Right heart failure, unspecified: Secondary | ICD-10-CM

## 2016-04-21 DIAGNOSIS — D649 Anemia, unspecified: Secondary | ICD-10-CM | POA: Diagnosis not present

## 2016-04-21 DIAGNOSIS — R040 Epistaxis: Secondary | ICD-10-CM | POA: Insufficient documentation

## 2016-04-21 DIAGNOSIS — Z79899 Other long term (current) drug therapy: Secondary | ICD-10-CM | POA: Diagnosis not present

## 2016-04-21 DIAGNOSIS — Z950 Presence of cardiac pacemaker: Secondary | ICD-10-CM | POA: Diagnosis not present

## 2016-04-21 DIAGNOSIS — I5032 Chronic diastolic (congestive) heart failure: Secondary | ICD-10-CM | POA: Diagnosis present

## 2016-04-21 DIAGNOSIS — I272 Other secondary pulmonary hypertension: Secondary | ICD-10-CM | POA: Insufficient documentation

## 2016-04-21 DIAGNOSIS — I509 Heart failure, unspecified: Secondary | ICD-10-CM | POA: Diagnosis not present

## 2016-04-21 DIAGNOSIS — I4891 Unspecified atrial fibrillation: Secondary | ICD-10-CM | POA: Diagnosis not present

## 2016-04-21 DIAGNOSIS — Z66 Do not resuscitate: Secondary | ICD-10-CM | POA: Diagnosis not present

## 2016-04-21 DIAGNOSIS — D500 Iron deficiency anemia secondary to blood loss (chronic): Principal | ICD-10-CM

## 2016-04-21 DIAGNOSIS — D61818 Other pancytopenia: Secondary | ICD-10-CM | POA: Diagnosis not present

## 2016-04-21 DIAGNOSIS — I071 Rheumatic tricuspid insufficiency: Secondary | ICD-10-CM

## 2016-04-21 DIAGNOSIS — N184 Chronic kidney disease, stage 4 (severe): Secondary | ICD-10-CM | POA: Insufficient documentation

## 2016-04-21 DIAGNOSIS — E871 Hypo-osmolality and hyponatremia: Secondary | ICD-10-CM | POA: Diagnosis present

## 2016-04-21 DIAGNOSIS — I780 Hereditary hemorrhagic telangiectasia: Secondary | ICD-10-CM | POA: Diagnosis present

## 2016-04-21 DIAGNOSIS — K7460 Unspecified cirrhosis of liver: Secondary | ICD-10-CM | POA: Diagnosis not present

## 2016-04-21 DIAGNOSIS — R1110 Vomiting, unspecified: Secondary | ICD-10-CM | POA: Diagnosis present

## 2016-04-21 DIAGNOSIS — K7682 Hepatic encephalopathy: Secondary | ICD-10-CM | POA: Diagnosis present

## 2016-04-21 LAB — CBC
HCT: 20.9 % — ABNORMAL LOW (ref 39.0–52.0)
HEMATOCRIT: 22.3 % — AB (ref 39.0–52.0)
HEMOGLOBIN: 6.8 g/dL — AB (ref 13.0–17.0)
Hemoglobin: 6.3 g/dL — CL (ref 13.0–17.0)
MCH: 25.9 pg — AB (ref 26.0–34.0)
MCH: 26.4 pg (ref 26.0–34.0)
MCHC: 30.1 g/dL (ref 30.0–36.0)
MCHC: 30.5 g/dL (ref 30.0–36.0)
MCV: 86 fL (ref 78.0–100.0)
MCV: 86.4 fL (ref 78.0–100.0)
PLATELETS: 66 10*3/uL — AB (ref 150–400)
Platelets: 84 10*3/uL — ABNORMAL LOW (ref 150–400)
RBC: 2.43 MIL/uL — AB (ref 4.22–5.81)
RBC: 2.58 MIL/uL — ABNORMAL LOW (ref 4.22–5.81)
RDW: 18.7 % — AB (ref 11.5–15.5)
RDW: 18.9 % — AB (ref 11.5–15.5)
WBC: 3.7 10*3/uL — ABNORMAL LOW (ref 4.0–10.5)
WBC: 4.6 10*3/uL (ref 4.0–10.5)

## 2016-04-21 LAB — URINALYSIS, ROUTINE W REFLEX MICROSCOPIC
Bilirubin Urine: NEGATIVE
GLUCOSE, UA: NEGATIVE mg/dL
Hgb urine dipstick: NEGATIVE
KETONES UR: NEGATIVE mg/dL
LEUKOCYTES UA: NEGATIVE
Nitrite: NEGATIVE
PH: 7 (ref 5.0–8.0)
Protein, ur: NEGATIVE mg/dL
SPECIFIC GRAVITY, URINE: 1.01 (ref 1.005–1.030)

## 2016-04-21 LAB — COMPREHENSIVE METABOLIC PANEL
ALBUMIN: 2.8 g/dL — AB (ref 3.5–5.0)
ALT: 23 U/L (ref 17–63)
ALT: 24 U/L (ref 17–63)
ANION GAP: 9 (ref 5–15)
ANION GAP: 9 (ref 5–15)
AST: 31 U/L (ref 15–41)
AST: 33 U/L (ref 15–41)
Albumin: 2.7 g/dL — ABNORMAL LOW (ref 3.5–5.0)
Alkaline Phosphatase: 122 U/L (ref 38–126)
Alkaline Phosphatase: 127 U/L — ABNORMAL HIGH (ref 38–126)
BUN: 49 mg/dL — ABNORMAL HIGH (ref 6–20)
BUN: 48 mg/dL — AB (ref 6–20)
CALCIUM: 8.8 mg/dL — AB (ref 8.9–10.3)
CHLORIDE: 100 mmol/L — AB (ref 101–111)
CO2: 21 mmol/L — AB (ref 22–32)
CO2: 23 mmol/L (ref 22–32)
CREATININE: 2.36 mg/dL — AB (ref 0.61–1.24)
Calcium: 8.9 mg/dL (ref 8.9–10.3)
Chloride: 99 mmol/L — ABNORMAL LOW (ref 101–111)
Creatinine, Ser: 2.28 mg/dL — ABNORMAL HIGH (ref 0.61–1.24)
GFR calc Af Amer: 30 mL/min — ABNORMAL LOW (ref >60)
GFR, EST AFRICAN AMERICAN: 29 mL/min — AB (ref >60)
GFR, EST NON AFRICAN AMERICAN: 25 mL/min — AB (ref >60)
GFR, EST NON AFRICAN AMERICAN: 26 mL/min — AB (ref >60)
GLUCOSE: 86 mg/dL (ref 65–99)
Glucose, Bld: 152 mg/dL — ABNORMAL HIGH (ref 65–99)
POTASSIUM: 4.6 mmol/L (ref 3.5–5.1)
Potassium: 4.7 mmol/L (ref 3.5–5.1)
Sodium: 129 mmol/L — ABNORMAL LOW (ref 135–145)
Sodium: 132 mmol/L — ABNORMAL LOW (ref 135–145)
Total Bilirubin: 2.3 mg/dL — ABNORMAL HIGH (ref 0.3–1.2)
Total Bilirubin: 2.2 mg/dL — ABNORMAL HIGH (ref 0.3–1.2)
Total Protein: 5 g/dL — ABNORMAL LOW (ref 6.5–8.1)
Total Protein: 5.1 g/dL — ABNORMAL LOW (ref 6.5–8.1)

## 2016-04-21 LAB — PREPARE RBC (CROSSMATCH)

## 2016-04-21 LAB — I-STAT TROPONIN, ED: Troponin i, poc: 0.04 ng/mL (ref 0.00–0.08)

## 2016-04-21 LAB — AMMONIA: AMMONIA: 121 umol/L — AB (ref 9–35)

## 2016-04-21 LAB — LIPASE, BLOOD: Lipase: 57 U/L — ABNORMAL HIGH (ref 11–51)

## 2016-04-21 MED ORDER — SODIUM CHLORIDE 0.9 % IV SOLN
10.0000 mL/h | Freq: Once | INTRAVENOUS | Status: AC
  Administered 2016-04-21: 10 mL/h via INTRAVENOUS

## 2016-04-21 MED ORDER — DIGOXIN 125 MCG PO TABS: 0.1250 mg | ORAL_TABLET | ORAL | Status: DC

## 2016-04-21 MED ORDER — TORSEMIDE 20 MG PO TABS
80.0000 mg | ORAL_TABLET | Freq: Two times a day (BID) | ORAL | Status: DC
  Administered 2016-04-22 – 2016-04-23 (×3): 80 mg via ORAL
  Filled 2016-04-21 (×3): qty 4

## 2016-04-21 MED ORDER — POTASSIUM CHLORIDE CRYS ER 20 MEQ PO TBCR
20.0000 meq | EXTENDED_RELEASE_TABLET | Freq: Every day | ORAL | Status: DC
  Administered 2016-04-22 – 2016-04-23 (×2): 20 meq via ORAL
  Filled 2016-04-21 (×2): qty 1

## 2016-04-21 MED ORDER — LACTULOSE 10 GM/15ML PO SOLN
30.0000 g | Freq: Two times a day (BID) | ORAL | Status: DC
  Administered 2016-04-21 – 2016-04-22 (×2): 30 g via ORAL
  Filled 2016-04-21: qty 45

## 2016-04-21 MED ORDER — SODIUM CHLORIDE 0.9 % IV SOLN: Freq: Once | INTRAVENOUS | Status: DC

## 2016-04-21 MED ORDER — MAGNESIUM OXIDE 400 (241.3 MG) MG PO TABS
200.0000 mg | ORAL_TABLET | Freq: Every day | ORAL | Status: DC
Start: 2016-04-22 — End: 2016-04-23
  Administered 2016-04-22 – 2016-04-23 (×2): 200 mg via ORAL
  Filled 2016-04-21 (×2): qty 1

## 2016-04-21 MED ORDER — METOLAZONE 2.5 MG PO TABS: 2.5000 mg | ORAL_TABLET | ORAL | Status: DC | PRN

## 2016-04-21 MED ORDER — FUROSEMIDE 10 MG/ML IJ SOLN
20.0000 mg | INTRAMUSCULAR | Status: DC
  Administered 2016-04-21: 20 mg via INTRAVENOUS
  Filled 2016-04-21: qty 2

## 2016-04-21 MED ORDER — RIFAXIMIN 550 MG PO TABS
550.0000 mg | ORAL_TABLET | Freq: Two times a day (BID) | ORAL | Status: DC
  Administered 2016-04-21 – 2016-04-23 (×4): 550 mg via ORAL
  Filled 2016-04-21 (×4): qty 1

## 2016-04-21 MED ORDER — ONDANSETRON 4 MG PO TBDP
ORAL_TABLET | ORAL | Status: AC
  Filled 2016-04-21: qty 1

## 2016-04-21 MED ORDER — SODIUM CHLORIDE 0.9% FLUSH
3.0000 mL | Freq: Two times a day (BID) | INTRAVENOUS | Status: DC
  Administered 2016-04-21 – 2016-04-23 (×4): 3 mL via INTRAVENOUS

## 2016-04-21 MED ORDER — FERROUS SULFATE 325 (65 FE) MG PO TABS
325.0000 mg | ORAL_TABLET | Freq: Two times a day (BID) | ORAL | Status: DC
  Administered 2016-04-22 – 2016-04-23 (×3): 325 mg via ORAL
  Filled 2016-04-21 (×3): qty 1

## 2016-04-21 MED ORDER — PANTOPRAZOLE SODIUM 40 MG PO TBEC
40.0000 mg | DELAYED_RELEASE_TABLET | Freq: Every day | ORAL | Status: DC
  Administered 2016-04-22 – 2016-04-23 (×2): 40 mg via ORAL
  Filled 2016-04-21 (×2): qty 1

## 2016-04-21 MED ORDER — SPIRONOLACTONE 25 MG PO TABS
50.0000 mg | ORAL_TABLET | Freq: Every day | ORAL | Status: DC
  Administered 2016-04-22 – 2016-04-23 (×2): 50 mg via ORAL
  Filled 2016-04-21 (×2): qty 2

## 2016-04-21 MED ORDER — ONDANSETRON 4 MG PO TBDP
4.0000 mg | ORAL_TABLET | Freq: Once | ORAL | Status: AC | PRN
  Administered 2016-04-21: 4 mg via ORAL

## 2016-04-21 NOTE — H&P (Addendum)
History and Physical    Boise Fehringer [id number] DOB: Nov 19, 1935 DOA: 04/21/2016  Referring MD/NP/PA:  PCP: Penni Homans, MD (Confirm with patient/family/NH records and if not entered, this has to be entered at [facility] point of entry) Outpatient Specialists:  ([facility] speciality and name if known) Patient coming from: Home referred by Primary Care  Chief Complaint: Easy fatigability  HPI: Hunter [name] is a 81 y.o. male with medical history significant of Osler-Weber-Rendu syndrome, he has had multiple posttransfusion the past. For the last 3 days patient has been noted to be significantly weak and  fatigued, symptoms have been persistent and worsening, moderate to severe in intensity, associated with somnolence, worse with exertion, no improving factors. Patient has had chronic epistaxis almost daily for many weeks now. Patient was seen by his primary care physician today where his hemoglobin was noted to be low and he was referred to the hospital for further evaluation and management.  Most information obtained from his wife at bedside patient appears somnolent.  ED Course: Patient was certain blood transfusion  Review of Systems:  ENT. No runny nose or throat, positive epistaxis Cardiovascular. No palpitations, no dyspnea, no PND orthopnea, no gross routine edema Pulmonary. Positive focus on exertion but no recent cough or hemoptysis Gastrointestinal. No nausea, vomiting or diarrhea no melena or hematemesis no hematochezia Gen. positive for weakness, fatigue and somnolence. Urology. No dysuria hematuria or increased urinary frequency Neurology. No seizures or paresthesias Skin. Chronic skin ecchymosis Psych no depression or anxiety Visual. No blurred vision Endocrine no tremors, heat or cold intolerance  Past Medical History  Diagnosis Date  . CHF (congestive heart failure) ([facility])   . Pacemaker     single lead Medtronic pacemaker  . HHT (hereditary hemorrhagic telangiectasia)  ([facility])   . Anemia   . Pulmonary hypertension ([place])   . Osler-Weber-Rendu syndrome ([place])   . Renal insufficiency   . Liver failure ([place])   . Atrial fibrillation ([place])   . Hepatic AV fistula ([place]) 08/09/2015  . 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 Anemia due to chronic blood loss 08/09/2015  . Right heart failure ([place]) 08/09/2015  . Tricuspid regurgitation 08/09/2015  . Osler-Weber-Rendu syndrome ([place])   . Melanoma ([place])     2 on nose, 1 on back, 1 on leg    Past Surgical History  Procedure Laterality Date  . Pacemaker insertion    . Colon surgery  1196  . Esophagogastroduodenoscopy N/A 08/13/2015    Procedure: ESOPHAGOGASTRODUODENOSCOPY (EGD);  Surgeon: Ladene Artist, MD;  Location: [facility] ENDOSCOPY;  Service: Endoscopy;  Laterality: N/A;  . Tonsillectomy Bilateral      reports that he has never smoked. He has never used smokeless tobacco. He reports that he does not drink alcohol or use illicit drugs.  No Known Allergies  Family History  Problem Relation Age of Onset  . Other Mother     HHT  . Other Son     HHT  . Heart failure Father   . Heart disease Sister     afib, pacer  . Arthritis Brother     b/l TKR  . Other Brother   . Heart disease Sister     pacer   Family history positive for hematologic disorders  Social history negative for tobacco, alcohol or ilegal drug abuse.  Prior to Admission medications   Medication Sig Start Date End Date Taking? Authorizing Provider  b complex-vitamin c-folic acid (NEPHRO-VITE) 0.8 MG TABS tablet Take 1 tablet by mouth daily. 10/18/15  Yes Mosie Lukes, MD  cyanocobalamin (,VITAMIN B-12,) 1000 MCG/ML injection Inject 1,000 mcg into the muscle every 30 (thirty) days. 1st of every month   Yes Historical Provider, MD  digoxin (LANOXIN) 0.125 MG tablet Take 1 tablet (0.125 mg total) by mouth 3 (three) times a week. Jory Sims, Fri 09/02/15  Yes Shaune Pascal Bensimhon, MD  feeding supplement, ENSURE ENLIVE, (ENSURE ENLIVE) LIQD Take 237 mLs by mouth  2 (two) times daily between meals. 08/15/15  Yes Olam Idler, MD  ferrous sulfate 325 (65 FE) MG EC tablet Take 325 mg by mouth 2 (two) times daily with a meal.    Yes Historical Provider, MD  KLOR-CON M20 20 MEQ tablet TAKE TWO TABLETS BY MOUTH IN THE MORNING AND ONE TABLET IN THE EVENING 03/29/16  Yes Shaune Pascal Bensimhon, MD  lactulose (CHRONULAC) 10 GM/15ML solution Take 45 mLs (30 g total) by mouth 2 (two) times daily. 01/27/16  Yes Mosie Lukes, MD  Magnesium 250 MG TABS Take 1 tablet by mouth daily.   Yes Historical Provider, MD  metolazone (ZAROXOLYN) 2.5 MG tablet Take 1 tablet (2.5 mg total) by mouth as needed (for weight of 171 lb or greater). 03/27/16  Yes Shaune Pascal Bensimhon, MD  ondansetron (ZOFRAN) 4 MG tablet Take 1 tablet (4 mg total) by mouth every 8 (eight) hours as needed for nausea or vomiting. 09/20/15  Yes Gatha Mayer, MD  pantoprazole (PROTONIX) 40 MG tablet TAKE ONE TABLET BY MOUTH ONCE DAILY 04/20/16  Yes Jolaine Artist, MD  promethazine (PHENERGAN) 25 MG tablet Take 6.25 mg by mouth daily as needed for nausea or vomiting. Reported on 04/21/2016 07/27/15  Yes Historical Provider, MD  spironolactone (ALDACTONE) 50 MG tablet Take 50 mg (1 tab) in am and 25 mg (1/2 tab) in pm. 03/20/16  Yes Jolaine Artist, MD  SYRINGE-NEEDLE, DISP, 3 ML 25G X 5/8" 3 ML MISC To use with B12 injections 03/09/16  Yes Mosie Lukes, MD  torsemide (DEMADEX) 20 MG tablet Take 4 tablets (80 mg total) by mouth 2 (two) times daily. 03/20/16  Yes Jolaine Artist, MD  XIFAXAN 550 MG TABS tablet TAKE ONE TABLET BY MOUTH TWICE DAILY 10/28/15  Yes Gatha Mayer, MD    Physical Exam: Filed Vitals:   04/21/16 1715 04/21/16 1730 04/21/16 1745 04/21/16 1800  BP: 108/57 106/53 119/63 105/43  Pulse: 80 80 85 81  Temp:      TempSrc:      Resp: 14 21 22 15   Height:      Weight:      SpO2: 99% 100% 100% 100%      Constitutional: NAD, calm, comfortable Filed Vitals:   04/21/16 1715 04/21/16 1730  04/21/16 1745 04/21/16 1800  BP: 108/57 106/53 119/63 105/43  Pulse: 80 80 85 81  Temp:      TempSrc:      Resp: 14 21 22 15   Height:      Weight:      SpO2: 99% 100% 100% 100%   Eyes: PERRL, lids and conjunctivae with pallor but no icterus ENMT: Mucous membranes are . Posterior pharynx clear of any exudate or lesions.Normal dentition.  Neck: normal, supple, no masses, no thyromegaly Respiratory: clear to auscultation bilaterally, no wheezing, no crackles. Normal respiratory effort. No accessory muscle use. Poor inspiratory effort with decreased breath sounds at bases Cardiovascular: Regular rate and rhythm, positive murmurs at the upper apex, systolic with no radiations . 3/ 6  no rubs / gallops. No extremity edema. 2+ pedal pulses. No carotid bruits.  Abdomen: no tenderness, no masses palpated. No hepatosplenomegaly. Bowel sounds positive.  Musculoskeletal: no clubbing / cyanosis. No joint deformity upper and lower extremities. Good ROM, no contractures. Normal muscle tone.  Skin: no rashes, lesions, ulcers. No induration. Multiple telangiectasias generalized Neurologic: CN 2-12 grossly intact. Sensation intact, DTR normal. Strength 5/5 in all 4.   Labs on Admission: I have personally reviewed following labs and imaging studies  CBC:  Recent Labs Lab 04/21/16 1130 04/21/16 1519  WBC 3.7* 4.6  HGB 6.3* 6.8*  HCT 20.9* 22.3*  MCV 86.0 86.4  PLT 66* 84*   Basic Metabolic Panel:  Recent Labs Lab 04/21/16 1130 04/21/16 1519  NA 129* 132*  K 4.7 4.6  CL 99* 100*  CO2 21* 23  GLUCOSE 152* 86  BUN 49* 48*  CREATININE 2.36* 2.28*  CALCIUM 8.8* 8.9   GFR: Estimated Creatinine Clearance: 29.3 mL/min (by C-G formula based on Cr of 2.28). Liver Function Tests:  Recent Labs Lab 04/21/16 1130 04/21/16 1519  AST 31 33  ALT 23 24  ALKPHOS 122 127*  BILITOT 2.3* 2.2*  PROT 5.0* 5.1*  ALBUMIN 2.7* 2.8*    Recent Labs Lab 04/21/16 1519  LIPASE 57*    Recent  Labs Lab 04/21/16 1519  AMMONIA 121*   Coagulation Profile: No results for input(s): INR, PROTIME in the last 168 hours. Cardiac Enzymes: No results for input(s): CKTOTAL, CKMB, CKMBINDEX, TROPONINI in the last 168 hours. BNP (last 3 results)  Recent Labs  08/09/15 1621  PROBNP 188.0*   HbA1C: No results for input(s): HGBA1C in the last 72 hours. CBG: No results for input(s): GLUCAP in the last 168 hours. Lipid Profile: No results for input(s): CHOL, HDL, LDLCALC, TRIG, CHOLHDL, LDLDIRECT in the last 72 hours. Thyroid Function Tests: No results for input(s): TSH, T4TOTAL, FREET4, T3FREE, THYROIDAB in the last 72 hours. Anemia Panel: No results for input(s): VITAMINB12, FOLATE, FERRITIN, TIBC, IRON, RETICCTPCT in the last 72 hours. Urine analysis:    Component Value Date/Time   COLORURINE YELLOW 04/21/2016 [place] 04/21/2016 1704   LABSPEC 1.010 04/21/2016 1704   PHURINE 7.0 04/21/2016 1704   GLUCOSEU NEGATIVE 04/21/2016 1704   HGBUR NEGATIVE 04/21/2016 1704   BILIRUBINUR NEGATIVE 04/21/2016 1704   KETONESUR NEGATIVE 04/21/2016 1704   PROTEINUR NEGATIVE 04/21/2016 1704   UROBILINOGEN 0.2 08/10/2015 1700   NITRITE NEGATIVE 04/21/2016 1704   LEUKOCYTESUR NEGATIVE 04/21/2016 1704   Sepsis Labs: @LABRCNTIP (procalcitonin:4,lacticidven:4) )No results found for this or any previous visit (from the past 240 hour(s)).   Radiological Exams on Admission: Dg Chest Port 1 View  04/21/2016  CLINICAL DATA:  Shortness of Breath EXAM: PORTABLE CHEST 1 VIEW COMPARISON:  None. FINDINGS: There is no edema or consolidation. Heart is enlarged with pulmonary vascularity within normal limits. Pacemaker lead is attached to the right ventricle. No adenopathy. There are surgical clips in the lower right neck region. IMPRESSION: Cardiomegaly.  No edema or consolidation. Electronically Signed   By: Lowella Grip III M.D.   On: 04/21/2016 15:49    Personally reviewed, AP film  rotated to the right side, poor inspiration but good penetration, noted cardiomegaly and increased pulmonary vasculature, no infiltrates, effusions or signs of pneumothorax. Pacemaker in place.  EKG: Personally reviewed showing 100% V paced  Assessment/Plan Active Problems:   Anemia   Symptomatic anemia   Blood loss anemia  CKD stage IV  This  is a 81 year old gentleman with history syndrome of Osler-Weber-Rendu who presents symptomatically anemia with CBC fatigability, decreased energy and somnolence. His hemoglobin has been found to be at 6.3 range. Patient has a subacute epistaxis. On the physical examination his clinically dry, no signs of active heart failure. He is a creatinine is at 2.2 range consistent with his chronic kidney disease stage IV.   Working diagnosis symptomatic anemia due to subacute blood loss/epistaxis.  1. Cardiovascular. Patient will be admitted to medical floor on remote telemetry. Patient will be continued on his diuretic therapy including metolazone, spironolactone, torsemide. Old records personally reviewed echocardiogram from 2016 showed ejection fraction 45-50% with left ventricular hypertrophy. Patient received 2 units of packed red blood cells and will give furosemide intravenously. Patient will be transfused with 2 units of packed red blood cells and will give furosemide after each unit intravenously. Continue digoxin, patient not on beta-blockade  2. Pulmonary. Will continue to monitor oximetry, avoid volume overload, supplemental oxygen per nasal cannula to target O2 sat duration more than 92%.   3. Nephrology. Patient has a chronic kidney disease stage IV, will continue diuretics per his home regimen and would use furosemide intravenously after each unit 20 mg. Will follow kidney function in the morning, avoid hypotension and other nephrotoxic medications. His potassium is 4.7 with a sodium of 129. Serum bicarbonate of 21.  4. Hematology. Subacute blood loss  anemia, will continue ferrous sulfate, will check iron panel. Blood transfusions 2 units. Patient will follow up with his hematologist outpatient.  5. DVT to prophylaxis, with mechanical devices  Patient is high risk for developing in organ damage due to his acute and severe anemia.       DVT prophylaxis:  Mechanical devices (Lovenox/Heparin/SCD's/anticoagulated/None (if comfort care) Code Status:  DO NOT RESUSCITATE (Full/Partial (specify details) Family Communication: I spoke with patient's wife at bedside and questions were addressed  (Specify name, relationship. Do not write "discussed with patient". Specify tel # if discussed over the phone) Disposition Plan:  (specify when and where you expect patient to be discharged) Consults called:  (with names) Admission status: Observation  (inpatient / obs / tele / medical floor / SDU)   Mauricio Gerome Apley MD [facility]   If 7PM-7AM, please contact night-coverage [email or web address] Password [email or web address]  04/21/2016, 6:18 PM

## 2016-04-21 NOTE — Progress Notes (Signed)
Advanced Heart Failure Medication Review by a Pharmacist  Does the patient  feel that his/her medications are working for him/her?  yes  Has the patient been experiencing any side effects to the medications prescribed?  no  Does the patient measure his/her own blood pressure or blood glucose at home?  no   Does the patient have any problems obtaining medications due to transportation or finances?   no  Understanding of regimen: good Understanding of indications: good Potential of compliance: good Patient understands to avoid NSAIDs. Patient understands to avoid decongestants.  Issues to address at subsequent visits: None   Pharmacist comments:  Hunter [name] is a pleasant 81 yo M presenting with his wife and a current medication list. They report good compliance with his regimen and did not have any specific medication-related questions or concerns for me at this time.   Ruta Hinds. Velva Harman, PharmD, BCPS, CPP Clinical Pharmacist Pager: [phone] Phone: [phone] 04/21/2016 10:38 AM      Time with patient: 10 minutes Preparation and documentation time: 2 minutes Total time: 12 minutes

## 2016-04-21 NOTE — ED Provider Notes (Signed)
CSN: [id number]     Arrival date & time 04/21/16  1447 History   First MD Initiated Contact with Patient 04/21/16 1521     Chief Complaint  Patient presents with  . Emesis  . Abnormal Lab     The history is provided by the patient and the spouse. No language interpreter was used.   Hunter [name] is a 81 y.o. male who presents to the Emergency Department complaining of vomiting, abnormal lab.  He has a history of CHF, hereditary hemorrhagic tones ectasia, chronic kidney disease, liver disease. He presented to the cardiology office for routine follow-up today and had labs checked and was noted to have a hemoglobin of 6. He was referred to the emergency department for further evaluation. His wife states that he has been more sleepy than usual and increased vomiting starting today. No reports of fevers, chest pain, abdominal pain, diarrhea, melena, hematochezia. He has chronic coughing that is unchanged from his baseline. He gets frequent blood transfusions for anemia.  Past Medical History  Diagnosis Date  . CHF (congestive heart failure) ([place])   . Pacemaker     single lead Medtronic pacemaker  . HHT (hereditary hemorrhagic telangiectasia) ([place])   . Anemia   . Pulmonary hypertension ([place])   . 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 Renal insufficiency   . Liver failure ([place])   . Atrial fibrillation ([place])   . Hepatic AV fistula ([place]) 08/09/2015  . 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 Anemia due to chronic blood loss 08/09/2015  . Right heart failure ([place]) 08/09/2015  . Tricuspid regurgitation 08/09/2015  . 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 Melanoma ([place])     2 on nose, 1 on back, 1 on leg   Past Surgical History  Procedure Laterality Date  . Pacemaker insertion    . Colon surgery  1196  . Esophagogastroduodenoscopy N/A 08/13/2015    Procedure: ESOPHAGOGASTRODUODENOSCOPY (EGD);  Surgeon: Ladene Artist, MD;  Location: [facility] ENDOSCOPY;  Service: Endoscopy;  Laterality: N/A;  . Tonsillectomy  Bilateral    Family History  Problem Relation Age of Onset  . Other Mother     HHT  . Other Son     HHT  . Heart failure Father   . Heart disease Sister     afib, pacer  . Arthritis Brother     b/l TKR  . Other Brother   . Heart disease Sister     pacer   Social History  Substance Use Topics  . Smoking status: Never Smoker   . Smokeless tobacco: Never Used  . Alcohol Use: No    Review of Systems  All other systems reviewed and are negative.     Allergies  Review of patient's allergies indicates no known allergies.  Home Medications   Prior to Admission medications   Medication Sig Start Date End Date Taking? Authorizing Provider  b complex-vitamin c-folic acid (NEPHRO-VITE) 0.8 MG TABS tablet Take 1 tablet by mouth daily. 10/18/15  Yes Mosie Lukes, MD  cyanocobalamin (,VITAMIN B-12,) 1000 MCG/ML injection Inject 1,000 mcg into the muscle every 30 (thirty) days. 1st of every month   Yes Historical Provider, MD  digoxin (LANOXIN) 0.125 MG tablet Take 1 tablet (0.125 mg total) by mouth 3 (three) times a week. Jory Sims, Fri 09/02/15  Yes Shaune Pascal Bensimhon, MD  feeding supplement, ENSURE ENLIVE, (ENSURE ENLIVE) LIQD Take 237 mLs by mouth 2 (two) times daily between meals. 08/15/15  Yes Olam Idler, MD  ferrous sulfate 325 (65 FE) MG EC tablet Take 325 mg by mouth 2 (two) times daily with a meal.    Yes Historical Provider, MD  KLOR-CON M20 20 MEQ tablet TAKE TWO TABLETS BY MOUTH IN THE MORNING AND ONE TABLET IN THE EVENING 03/29/16  Yes Shaune Pascal Bensimhon, MD  lactulose (CHRONULAC) 10 GM/15ML solution Take 45 mLs (30 g total) by mouth 2 (two) times daily. 01/27/16  Yes Mosie Lukes, MD  Magnesium 250 MG TABS Take 1 tablet by mouth daily.   Yes Historical Provider, MD  metolazone (ZAROXOLYN) 2.5 MG tablet Take 1 tablet (2.5 mg total) by mouth as needed (for weight of 171 lb or greater). 03/27/16  Yes Shaune Pascal Bensimhon, MD  ondansetron (ZOFRAN) 4 MG tablet Take 1 tablet (4  mg total) by mouth every 8 (eight) hours as needed for nausea or vomiting. 09/20/15  Yes Gatha Mayer, MD  pantoprazole (PROTONIX) 40 MG tablet TAKE ONE TABLET BY MOUTH ONCE DAILY 04/20/16  Yes Jolaine Artist, MD  promethazine (PHENERGAN) 25 MG tablet Take 6.25 mg by mouth daily as needed for nausea or vomiting. Reported on 04/21/2016 07/27/15  Yes Historical Provider, MD  spironolactone (ALDACTONE) 50 MG tablet Take 50 mg (1 tab) in am and 25 mg (1/2 tab) in pm. 03/20/16  Yes Jolaine Artist, MD  SYRINGE-NEEDLE, DISP, 3 ML 25G X 5/8" 3 ML MISC To use with B12 injections 03/09/16  Yes Mosie Lukes, MD  torsemide (DEMADEX) 20 MG tablet Take 4 tablets (80 mg total) by mouth 2 (two) times daily. 03/20/16  Yes Jolaine Artist, MD  XIFAXAN 550 MG TABS tablet TAKE ONE TABLET BY MOUTH TWICE DAILY 10/28/15  Yes Gatha Mayer, MD   BP 115/51 mmHg  Pulse 83  Temp(Src) 97.7 F (36.5 C) (Oral)  Resp 18  Ht 6' (1.829 m)  Wt 170 lb 6.4 oz (77.293 kg)  BMI 23.11 kg/m2  SpO2 100% Physical Exam  Constitutional: He is oriented to person, place, and time. He appears well-developed.  Chronically ill appearing  HENT:  Head: Normocephalic and atraumatic.  Cardiovascular: Regular rhythm.   No murmur heard. tachycardic  Pulmonary/Chest: No respiratory distress.  Frequent coughing. Good air movement bilaterally  Abdominal: Soft. There is no tenderness. There is no rebound and no guarding.  Musculoskeletal: He exhibits no edema or tenderness.  Neurological: He is alert and oriented to person, place, and time.  Skin: Skin is warm and dry.  Multiple telengectasia of head and neck.   Psychiatric: He has a normal mood and affect. His behavior is normal.  Nursing note and vitals reviewed.   ED Course  Procedures (including critical care time) Labs Review Labs Reviewed  LIPASE, BLOOD - Abnormal; Notable for the following:    Lipase 57 (*)    All other components within normal limits   COMPREHENSIVE METABOLIC PANEL - Abnormal; Notable for the following:    Sodium 132 (*)    Chloride 100 (*)    BUN 48 (*)    Creatinine, Ser 2.28 (*)    Total Protein 5.1 (*)    Albumin 2.8 (*)    Alkaline Phosphatase 127 (*)    Total Bilirubin 2.2 (*)    GFR calc non Af Amer 26 (*)    GFR calc Af Amer 30 (*)    All other components within normal limits  CBC - Abnormal; Notable for the following:    RBC 2.58 (*)  Hemoglobin 6.8 (*)    HCT 22.3 (*)    RDW 18.9 (*)    Platelets 84 (*)    All other components within normal limits  URINALYSIS, ROUTINE W REFLEX MICROSCOPIC (NOT AT [facility])  COMPREHENSIVE METABOLIC PANEL  I-STAT TROPOININ, ED  POC OCCULT BLOOD, ED  TYPE AND SCREEN  PREPARE RBC (CROSSMATCH)  PREPARE RBC (CROSSMATCH)    Imaging Review Dg Chest Port 1 View  04/21/2016  CLINICAL DATA:  Shortness of Breath EXAM: PORTABLE CHEST 1 VIEW COMPARISON:  None. FINDINGS: There is no edema or consolidation. Heart is enlarged with pulmonary vascularity within normal limits. Pacemaker lead is attached to the right ventricle. No adenopathy. There are surgical clips in the lower right neck region. IMPRESSION: Cardiomegaly.  No edema or consolidation. Electronically Signed   By: Lowella Grip III M.D.   On: 04/21/2016 15:49   I have personally reviewed and evaluated these images and lab results as part of my medical decision-making.   EKG Interpretation   Date/Time:  Friday Apr 21 2016 15:08:33 EDT Ventricular Rate:  80 PR Interval:    QRS Duration: 182 QT Interval:  446 QTC Calculation: 514 R Axis:   156 Text Interpretation:  Ventricular-paced rhythm Abnormal ECG Confirmed by  Hazle Coca [phone]) on 04/21/2016 3:21:53 PM      MDM   Final diagnoses:  Symptomatic anemia    Patient referred for hemoglobin of 6 on outpatient lab. He has persistent vomiting that started today and increase cough. Plan to transfuse for symptomatic anemia. Will admit for observation.  Hospitalist consulted for admission.  Quintella Reichert, MD 04/21/16 2325

## 2016-04-21 NOTE — Progress Notes (Signed)
Patient ID: Hunter [name], male   DOB: 03-26-1935, 81 y.o.   MRN: [id number]   Advanced Heart Failure Clinic Note   Patient ID: Hunter [name], male   DOB: [birth year], 81 y.o.   MRN: [id number]  Primary Cardiologist: Dr. Claude Manges, in [place], had also been acting as PCP Primary HF: Dr. Haroldine Laws  PCP: Randel Pigg  HPI: [name] is a 81 y.o. male with a history of diastolic HF Echo 0/86/57 EF 45-50% NYHA class III, pulmonary hypertension, liver failure, CKD stage III, and hereditary hemorrhagic telangiectasia with hepatic AVMs and associated anemia requiring transfusion.[name]  He was admitted to [facility] on 8/30 after labs from [place] on 8/29 showed marked anemia with Hemoglobin of 5 and hyponatremia. He was transfused and also diuresed. EGD showed AVMs which were treated.  Weight on discharge 177 lbs.  He presents today for follow up with his wife. We recently increased torsemide and spiro. At last visit was volume status was improved. We told him to take metolazone as needed to protect weight of 168-171. At home he is maintaining weight of 164-165 rarely needs metolazone. Has dry hacking cough. Has gotten nauseated for past three days in the am then clears up. Decent appetite. No diarrhea or belly pain. Getting around ok. Doing PT . Denies SOB. No PND/Orthopnea. Getting around with a walker. Taking all medications.  PT and HHRN expiring next week.   Echo 08/11/15 45-50 %. RV normal. Marked biatrial enlargement with probable restrictive filling pattern. Moderate to severe MR. Moderate TR. PA peak pressure 51 mm Hg.  Labs: 09/15/15: Creatinine 1.77, K 3.2  11/15/15: K 3.9 Cr 1.89  Hgb 7.4 02/24/2016: K 3.8 Creatinine 1.98 Hgb 6.5  03/20/16: K 3.7, Creatinine 2.32, Ammonia 93, Hgb 7.6  Records from [place]: Echo 3/16 EF 50-55%, Grade 2 DD, RV severely dilated Echo 3/15 EF 55-60%, mod enlarged RV, RV systolic function low normal. Increased LV filling pressures, Moderate MR  RHC 01/19/15 RA 24/18,  (20) RV 45/20 PA 45/30 (35) PCW  mean 30    Past Medical History  Diagnosis Date  . CHF (congestive heart failure) ([place])   . Pacemaker     single lead Medtronic pacemaker  . HHT (hereditary hemorrhagic telangiectasia) ([place])   . Anemia   . Pulmonary hypertension ([place])   . 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 Renal insufficiency   . Liver failure ([place])   . Atrial fibrillation ([place])   . Hepatic AV fistula ([place]) 08/09/2015  . 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 Anemia due to chronic blood loss 08/09/2015  . Right heart failure ([place]) 08/09/2015  . Tricuspid regurgitation 08/09/2015  . 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 Melanoma ([place])     2 on nose, 1 on back, 1 on leg    Current Outpatient Prescriptions  Medication Sig Dispense Refill  . b complex-vitamin c-folic acid (NEPHRO-VITE) 0.8 MG TABS tablet Take 1 tablet by mouth daily. 30 tablet 6  . cyanocobalamin (,VITAMIN B-12,) 1000 MCG/ML injection Inject 1,000 mcg into the muscle every 30 (thirty) days.    . digoxin (LANOXIN) 0.125 MG tablet Take 1 tablet (0.125 mg total) by mouth 3 (three) times a week. Mon, Wed, Fri 15 tablet 6  . feeding supplement, ENSURE ENLIVE, (ENSURE ENLIVE) LIQD Take 237 mLs by mouth 2 (two) times daily between meals. 237 mL 12  . ferrous sulfate 325 (65 FE) MG EC tablet Take 325 mg by mouth 2 (two) times daily with a meal.     .  KLOR-CON M20 20 MEQ tablet TAKE TWO TABLETS BY MOUTH IN THE MORNING AND ONE TABLET IN THE EVENING 90 tablet 1  . lactulose (CHRONULAC) 10 GM/15ML solution Take 45 mLs (30 g total) by mouth 2 (two) times daily. 240 mL 3  . Magnesium 250 MG TABS Take 1 tablet by mouth daily.    . pantoprazole (PROTONIX) 40 MG tablet TAKE ONE TABLET BY MOUTH ONCE DAILY 30 tablet 0  . spironolactone (ALDACTONE) 50 MG tablet Take 50 mg (1 tab) in am and 25 mg (1/2 tab) in pm. 45 tablet 6  . SYRINGE-NEEDLE, DISP, 3 ML 25G X 5/8" 3 ML MISC To use with B12 injections 50 each 2  . torsemide (DEMADEX) 20 MG  tablet Take 4 tablets (80 mg total) by mouth 2 (two) times daily. 360 tablet 3  . traMADol (ULTRAM) 50 MG tablet Take 50 mg by mouth at bedtime as needed for moderate pain.    [name] XIFAXAN 550 MG TABS tablet TAKE ONE TABLET BY MOUTH TWICE DAILY 60 tablet 11  . metolazone (ZAROXOLYN) 2.5 MG tablet Take 1 tablet (2.5 mg total) by mouth as needed (for weight of 171 lb or greater). (Patient not taking: Reported on 04/21/2016) 30 tablet 1  . ondansetron (ZOFRAN) 4 MG tablet Take 1 tablet (4 mg total) by mouth every 8 (eight) hours as needed for nausea or vomiting. (Patient not taking: Reported on 04/21/2016) 30 tablet 5  . promethazine (PHENERGAN) 25 MG tablet Take 6.25 mg by mouth daily as needed for nausea or vomiting. Reported on 04/21/2016     No current facility-administered medications for this encounter.    No Known Allergies    Social History   Social History  . Marital Status: Married    Spouse Name: N/A  . Number of Children: 1  . Years of Education: N/A   Occupational History  . retired    Social History Main Topics  . Smoking status: Never Smoker   . Smokeless tobacco: Never Used  . Alcohol Use: No  . Drug Use: No  . 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 Not on file   Social History Narrative   Married 1 son   Retired Therapist, art   One caffeinated beverage a day no alcohol   He was living in [employer] at the [place] on [address] met in [place] and has located to [place] recently.   08/09/2015         Family History  Problem Relation Age of Onset  . Other Mother     HHT  . Other Son     HHT  . Heart failure Father   . Heart disease Sister     afib, pacer  . Arthritis Brother     b/l TKR  . Other Brother   . Heart disease Sister     pacer    Filed Vitals:   04/21/16 1028  BP: 102/58  Pulse: 101    Weight: 171 lb 12.8 oz (77.928 kg)  SpO2: 90%   Wt Readings from Last 3 Encounters:  04/21/16 171 lb 12.8 oz (77.928 kg)  03/27/16 175 lb 8 oz (79.606 kg)  03/20/16 180 lb 4 oz (81.761 kg)     PHYSICAL EXAM: General: Elderly, NAD HEENT: Diffuse telengectasias.  Neck: supple. JVP 7  Carotids 2+ bilat;  no bruits. No lymphadenopathy or thryomegaly. Cor: PMI nondisplaced. Regular rate & rhythm. II/VI SEM at LUSB and apex Lungs: Diminished basilar L>R Abdomen: soft, NT, no distention. No HSM appreciated. No bruits or masses. +BS Extremities: no cyanosis, clubbing, rash. 1t-1++ LE edema up into thighs. + very mild asterixis Neuro: alert & orientedx3, cranial nerves grossly intact. moves all 4 extremities w/o difficulty. Affect flat.    ASSESSMENT & PLAN:  1. Chronic diastolic CHF Echo 06/14/87 45-50%.  2. Pulmonary hypertension on Echo 08/11/15 PA peak pressure 51 mm Hg 3. Liver failure / cirrhosis 4. CKD Stage III 5. Chronic Anemia 6. Hereditary telangiectasia. 7. Hepatic encephalopathy  Volume status much improved with change in diuretic regimen. Needs labs today. Will check CMET, CBC and ammonia. Abdomen looks benign on exam.  I have asked them to talk to Dr. Carlean Purl if nausea continues   Bensimhon, Daniel,MD 11:00 AM

## 2016-04-21 NOTE — Patient Instructions (Signed)
Labs today  Your physician recommends that you schedule a follow-up appointment in: 2 months with Dr.Bensimhon  Do the following things EVERYDAY: 1) Weigh yourself in the morning before breakfast. Write it down and keep it in a log. 2) Take your medicines as prescribed 3) Eat low salt foods-Limit salt (sodium) to 2000 mg per day.  4) Stay as active as you can everyday 5) Limit all fluids for the day to less than 2 liters 6)   

## 2016-04-21 NOTE — Addendum Note (Signed)
Encounter addended by: Kerry Dory, CMA on: 04/21/2016 11:41 AM<BR>     Documentation filed: Orders, Dx Association, Patient Instructions Section

## 2016-04-21 NOTE — Telephone Encounter (Signed)
hgb today 6.3, per Dr Haroldine Laws have pt f/u w/Dr Celesta Aver office or go to ER.  Spoke w/Dr [employer] office they feel w/level so low should go to ER.  Spoke w/pt's wife, she is aware and agreeable, she will bring pt to [facility] ER

## 2016-04-21 NOTE — ED Notes (Signed)
Pt reports to the ED for eval of N/V and low Hb. He was seen today by Dr. Levell July and was called and told to come to the ED because his Hb was critically low. Was at the cardiologist for routine f/u. Pt actively vomiting on arrival. Pt has this occasionally when he rides in the car or eats too much. Denies any CP, SOB, or unusual fatigue. Denies any dark tarry stools or blood in his stools. He not on a blood thinner. He does have a hx of HHT. Pt A&Ox4, resp e/u, and skin warm and dry.

## 2016-04-22 DIAGNOSIS — K7290 Hepatic failure, unspecified without coma: Secondary | ICD-10-CM | POA: Diagnosis not present

## 2016-04-22 DIAGNOSIS — I780 Hereditary hemorrhagic telangiectasia: Secondary | ICD-10-CM

## 2016-04-22 DIAGNOSIS — D500 Iron deficiency anemia secondary to blood loss (chronic): Secondary | ICD-10-CM | POA: Diagnosis not present

## 2016-04-22 DIAGNOSIS — K7469 Other cirrhosis of liver: Secondary | ICD-10-CM

## 2016-04-22 DIAGNOSIS — I5032 Chronic diastolic (congestive) heart failure: Secondary | ICD-10-CM | POA: Diagnosis not present

## 2016-04-22 DIAGNOSIS — E871 Hypo-osmolality and hyponatremia: Secondary | ICD-10-CM

## 2016-04-22 LAB — COMPREHENSIVE METABOLIC PANEL
ALT: 22 U/L (ref 17–63)
AST: 29 U/L (ref 15–41)
Albumin: 2.8 g/dL — ABNORMAL LOW (ref 3.5–5.0)
Alkaline Phosphatase: 123 U/L (ref 38–126)
Anion gap: 11 (ref 5–15)
BILIRUBIN TOTAL: 6.2 mg/dL — AB (ref 0.3–1.2)
BUN: 48 mg/dL — ABNORMAL HIGH (ref 6–20)
CHLORIDE: 97 mmol/L — AB (ref 101–111)
CO2: 20 mmol/L — ABNORMAL LOW (ref 22–32)
Calcium: 8.7 mg/dL — ABNORMAL LOW (ref 8.9–10.3)
Creatinine, Ser: 2.27 mg/dL — ABNORMAL HIGH (ref 0.61–1.24)
GFR, EST AFRICAN AMERICAN: 30 mL/min — AB (ref >60)
GFR, EST NON AFRICAN AMERICAN: 26 mL/min — AB (ref >60)
Glucose, Bld: 85 mg/dL (ref 65–99)
POTASSIUM: 4.4 mmol/L (ref 3.5–5.1)
Sodium: 128 mmol/L — ABNORMAL LOW (ref 135–145)
TOTAL PROTEIN: 5.1 g/dL — AB (ref 6.5–8.1)

## 2016-04-22 LAB — TYPE AND SCREEN
ABO/RH(D): A POS
ANTIBODY SCREEN: NEGATIVE
UNIT DIVISION: 0
UNIT DIVISION: 0

## 2016-04-22 LAB — CBC
HEMATOCRIT: 23.7 % — AB (ref 39.0–52.0)
HEMATOCRIT: 24 % — AB (ref 39.0–52.0)
HEMOGLOBIN: 7.5 g/dL — AB (ref 13.0–17.0)
Hemoglobin: 7.6 g/dL — ABNORMAL LOW (ref 13.0–17.0)
MCH: 28 pg (ref 26.0–34.0)
MCH: 27 pg (ref 26.0–34.0)
MCHC: 32.1 g/dL (ref 30.0–36.0)
MCHC: 31.3 g/dL (ref 30.0–36.0)
MCV: 87.5 fL (ref 78.0–100.0)
MCV: 86.3 fL (ref 78.0–100.0)
PLATELETS: 60 10*3/uL — AB (ref 150–400)
Platelets: 57 10*3/uL — ABNORMAL LOW (ref 150–400)
RBC: 2.71 MIL/uL — AB (ref 4.22–5.81)
RBC: 2.78 MIL/uL — AB (ref 4.22–5.81)
RDW: 18 % — ABNORMAL HIGH (ref 11.5–15.5)
RDW: 17.9 % — ABNORMAL HIGH (ref 11.5–15.5)
WBC: 3.7 10*3/uL — AB (ref 4.0–10.5)
WBC: 3.8 10*3/uL — ABNORMAL LOW (ref 4.0–10.5)

## 2016-04-22 MED ORDER — LACTULOSE 10 GM/15ML PO SOLN
30.0000 g | Freq: Three times a day (TID) | ORAL | Status: DC
  Filled 2016-04-22: qty 45

## 2016-04-22 MED ORDER — LACTULOSE 10 GM/15ML PO SOLN
30.0000 g | Freq: Three times a day (TID) | ORAL | Status: DC
  Administered 2016-04-22 – 2016-04-23 (×3): 30 g via ORAL
  Filled 2016-04-22 (×3): qty 45

## 2016-04-22 MED ORDER — ENSURE ENLIVE PO LIQD
237.0000 mL | Freq: Two times a day (BID) | ORAL | Status: DC
  Administered 2016-04-22 – 2016-04-23 (×2): 237 mL via ORAL

## 2016-04-22 NOTE — Progress Notes (Addendum)
PROGRESS NOTE  Hunter [name]  [id number] DOB: 08/22/1935  DOA: 04/21/2016 PCP: Penni Homans, MD   Brief Narrative:  81 year old male with history of cirrhosis in the setting of heart failure, hepatic encephalopathy, Heriditary hemorrhagic telangiectasia, anemia due to chronic blood loss requiring transfusions, hyponatremia, chronic diastolic CHF, echo [id number] with EF 45-50 percent, PPM, pulmonary hypertension, stage 4 chronic kidney disease, chronic recurrent epistaxis was seen at advanced heart failure clinic on 5/12 and lab work revealed hemoglobin of 6.3 and patient was advised to come to the hospital for transfusion and evaluation.   Assessment & Plan:   Principal Problem:   Anemia due to chronic blood loss Active Problems:   HHT (hereditary hemorrhagic telangiectasia) (HCC)   Hyponatremia   Liver failure (HCC)   Cirrhosis (HCC)   Chronic diastolic heart failure (HCC)   Hepatic encephalopathy (HCC)   Anemia due to chronic blood loss - Related to hereditary hemorrhagic telangiectasia. Patient gives history of chronic recurrent epistaxis and had this up to day prior to admission. - Spouse reports intermittent dark stools which could be multifactorial i.e. swallowed blood, iron supplements, chronic GI bleeding. - Transfused 2 units of PRBC on night of admission. Hemoglobin improved from 6.8 > 7.6. - Follow CBCs closely and transfuse if hemoglobin <7 g per DL.  Acute hepatic encephalopathy/cirrhosis - Patient is somnolent, confused and as per spouse, not at baseline. Ammonia level elevated at 121. Received a dose of lactulose on night of admission. - Increase lactulose to 30 g 3 times a day and target 4 2-3 BMs per day. Monitor clinically. Check ammonia levels in a.m. - Sees Dr. Carlean Purl, [facility]  Chronic recurrent epistaxis - As per spouse, this has been going on for years. No current epistaxis. Follow clinically. Apparently has seen ENT in the remote past.  Chronic diastolic  CHF - Seems clinically compensated. Continue diuretics (torsemide and Aldactone). Did receive IV Lasix across blood transfusion. - Continue digoxin.  - Sees Dr. Haroldine Laws, Cards  Hyponatremia - Probably multifactorial related to cirrhosis, heart failure and chronic kidney disease. Relatively stable. Follow BMP in a.m.  Pancytopenia - Secondary to cirrhosis. Follow CBCs closely.  Stage IV chronic kidney disease - Creatinine appears to be at baseline.  Hereditary hemorrhagic telangiectasia   DVT prophylaxis: SCDs Code Status: DO NOT RESUSCITATE Family Communication: Discussed at length with patient's spouse at bedside on 5/13. Updated care and answered questions. Disposition Plan: DC home when medically stable.   Consultants:   None  Procedures:   None  Antimicrobials:   None    Subjective: Confused this morning. As per spouse, confused. Last BM on 5/12-patient states intermittent dark stool, spouse unable to verify. Denies dyspnea or chest pain.  Objective:  Filed Vitals:   04/22/16 0000 04/22/16 0233 04/22/16 0830 04/22/16 1323  BP: 106/49 98/42 95/48  102/47  Pulse: 79 80 81 80  Temp: 97.4 F (36.3 C) 97.7 F (36.5 C)  97.5 F (36.4 C)  TempSrc: Oral Oral    Resp: 16 18  18   Height:      Weight:      SpO2: 100% 100% 97% 100%    Intake/Output Summary (Last 24 hours) at 04/22/16 1644 Last data filed at 04/22/16 1630  Gross per 24 hour  Intake   1282 ml  Output   1275 ml  Net      7 ml   Filed Weights   04/21/16 1459 04/21/16 1847  Weight: 77.565 kg (171 lb) 77.293 kg (170 lb 6.4  oz)    Examination:  General exam: Pleasant elderly male lying comfortably propped up in bed. Respiratory system: Clear to auscultation. Respiratory effort normal. Cardiovascular system: S1 & S2 heard, RRR. No JVD, murmurs, rubs, gallops or clicks. No pedal edema. Telemetry: V paced rhythm. Gastrointestinal system: Abdomen is nondistended, soft and nontender. No  organomegaly or masses felt. Normal bowel sounds heard. Central nervous system: Alert and oriented only to self and partly to place. No focal neurological deficits. Extremities: Symmetric 5 x 5 power. Skin: No rashes, lesions or ulcers Psychiatry: Judgement and insight appear poor.     Data Reviewed: I have personally reviewed following labs and imaging studies  CBC:  Recent Labs Lab 04/21/16 1130 04/21/16 1519 04/22/16 0955  WBC 3.7* 4.6 3.7*  HGB 6.3* 6.8* 7.6*  HCT 20.9* 22.3* 23.7*  MCV 86.0 86.4 87.5  PLT 66* 84* 60*   Basic Metabolic Panel:  Recent Labs Lab 04/21/16 1130 04/21/16 1519 04/22/16 0811  NA 129* 132* 128*  K 4.7 4.6 4.4  CL 99* 100* 97*  CO2 21* 23 20*  GLUCOSE 152* 86 85  BUN 49* 48* 48*  CREATININE 2.36* 2.28* 2.27*  CALCIUM 8.8* 8.9 8.7*   GFR: Estimated Creatinine Clearance: 29.3 mL/min (by C-G formula based on Cr of 2.27). Liver Function Tests:  Recent Labs Lab 04/21/16 1130 04/21/16 1519 04/22/16 0811  AST 31 33 29  ALT 23 24 22   ALKPHOS 122 127* 123  BILITOT 2.3* 2.2* 6.2*  PROT 5.0* 5.1* 5.1*  ALBUMIN 2.7* 2.8* 2.8*    Recent Labs Lab 04/21/16 1519  LIPASE 57*    Recent Labs Lab 04/21/16 1519  AMMONIA 121*   Coagulation Profile: No results for input(s): INR, PROTIME in the last 168 hours. Cardiac Enzymes: No results for input(s): CKTOTAL, CKMB, CKMBINDEX, TROPONINI in the last 168 hours. BNP (last 3 results)  Recent Labs  08/09/15 1621  PROBNP 188.0*   HbA1C: No results for input(s): HGBA1C in the last 72 hours. CBG: No results for input(s): GLUCAP in the last 168 hours. Lipid Profile: No results for input(s): CHOL, HDL, LDLCALC, TRIG, CHOLHDL, LDLDIRECT in the last 72 hours. Thyroid Function Tests: No results for input(s): TSH, T4TOTAL, FREET4, T3FREE, THYROIDAB in the last 72 hours. Anemia Panel: No results for input(s): VITAMINB12, FOLATE, FERRITIN, TIBC, IRON, RETICCTPCT in the last 72  hours.  Sepsis Labs: No results for input(s): PROCALCITON, LATICACIDVEN in the last 168 hours.  No results found for this or any previous visit (from the past 240 hour(s)).       Radiology Studies: Dg Chest Port 1 View  04/21/2016  CLINICAL DATA:  Shortness of Breath EXAM: PORTABLE CHEST 1 VIEW COMPARISON:  None. FINDINGS: There is no edema or consolidation. Heart is enlarged with pulmonary vascularity within normal limits. Pacemaker lead is attached to the right ventricle. No adenopathy. There are surgical clips in the lower right neck region. IMPRESSION: Cardiomegaly.  No edema or consolidation. Electronically Signed   By: Lowella Grip III M.D.   On: 04/21/2016 15:49        Scheduled Meds: . sodium chloride   Intravenous Once  . [START ON 04/24/2016] digoxin  0.125 mg Oral Once per day on Mon Wed Fri  . feeding supplement (ENSURE ENLIVE)  237 mL Oral BID BM  . ferrous sulfate  325 mg Oral BID WC  . lactulose  30 g Oral TID  . magnesium oxide  200 mg Oral Daily  . pantoprazole  40  mg Oral Daily  . potassium chloride SA  20 mEq Oral Daily  . rifaximin  550 mg Oral BID  . sodium chloride flush  3 mL Intravenous Q12H  . spironolactone  50 mg Oral Daily  . torsemide  80 mg Oral BID   Continuous Infusions:       Time spent: 45 minutes.    [facility], MD [facility] Pager [phone]  If 7PM-7AM, please contact night-coverage [email or web address] Password [facility] 04/22/2016, 4:44 PM

## 2016-04-23 DIAGNOSIS — K7469 Other cirrhosis of liver: Secondary | ICD-10-CM | POA: Diagnosis not present

## 2016-04-23 DIAGNOSIS — D649 Anemia, unspecified: Secondary | ICD-10-CM | POA: Diagnosis not present

## 2016-04-23 DIAGNOSIS — I5032 Chronic diastolic (congestive) heart failure: Secondary | ICD-10-CM | POA: Diagnosis not present

## 2016-04-23 DIAGNOSIS — D500 Iron deficiency anemia secondary to blood loss (chronic): Secondary | ICD-10-CM | POA: Diagnosis not present

## 2016-04-23 LAB — CBC
HEMATOCRIT: 23.8 % — AB (ref 39.0–52.0)
HEMOGLOBIN: 7.5 g/dL — AB (ref 13.0–17.0)
MCH: 27.6 pg (ref 26.0–34.0)
MCHC: 31.5 g/dL (ref 30.0–36.0)
MCV: 87.5 fL (ref 78.0–100.0)
Platelets: 51 10*3/uL — ABNORMAL LOW (ref 150–400)
RBC: 2.72 MIL/uL — ABNORMAL LOW (ref 4.22–5.81)
RDW: 18.3 % — ABNORMAL HIGH (ref 11.5–15.5)
WBC: 3.9 10*3/uL — ABNORMAL LOW (ref 4.0–10.5)

## 2016-04-23 LAB — COMPREHENSIVE METABOLIC PANEL
ALK PHOS: 123 U/L (ref 38–126)
ALT: 23 U/L (ref 17–63)
ANION GAP: 11 (ref 5–15)
AST: 30 U/L (ref 15–41)
Albumin: 2.6 g/dL — ABNORMAL LOW (ref 3.5–5.0)
BILIRUBIN TOTAL: 3.6 mg/dL — AB (ref 0.3–1.2)
BUN: 45 mg/dL — ABNORMAL HIGH (ref 6–20)
CALCIUM: 9 mg/dL (ref 8.9–10.3)
CO2: 23 mmol/L (ref 22–32)
Chloride: 96 mmol/L — ABNORMAL LOW (ref 101–111)
Creatinine, Ser: 2.41 mg/dL — ABNORMAL HIGH (ref 0.61–1.24)
GFR calc non Af Amer: 24 mL/min — ABNORMAL LOW (ref >60)
GFR, EST AFRICAN AMERICAN: 28 mL/min — AB (ref >60)
Glucose, Bld: 111 mg/dL — ABNORMAL HIGH (ref 65–99)
Potassium: 4.1 mmol/L (ref 3.5–5.1)
Sodium: 130 mmol/L — ABNORMAL LOW (ref 135–145)
Total Protein: 5.1 g/dL — ABNORMAL LOW (ref 6.5–8.1)

## 2016-04-23 LAB — AMMONIA: AMMONIA: 178 umol/L — AB (ref 9–35)

## 2016-04-23 MED ORDER — LACTULOSE 10 GM/15ML PO SOLN: 30.0000 g | Freq: Three times a day (TID) | ORAL | Status: AC

## 2016-04-23 MED ORDER — PROMETHAZINE HCL 12.5 MG PO TABS: 6.2500 mg | ORAL_TABLET | Freq: Three times a day (TID) | ORAL | Status: AC | PRN

## 2016-04-23 NOTE — Discharge Summary (Signed)
Physician Discharge Summary   Patient ID: Hunter [name] MRN: [id number] DOB/AGE: 1935/09/07 81 y.o.  Admit date: 04/21/2016 Discharge date: 04/23/2016  Primary Care Physician:  Penni Homans, MD  Discharge Diagnoses:    .Symptomatic anemia . Liver failure ([place]) . Hyponatremia . HHT (hereditary hemorrhagic telangiectasia) ([place]) . Hepatic encephalopathy ([place]) . Cirrhosis ([place]) . Anemia due to chronic blood loss . Chronic diastolic heart failure (H[facility])  Consults:  None  Recommendations for Outpatient Follow-up:  1. Please repeat CBC/BMET at next visit   DIET: Heart healthy diet    Allergies:  No Known Allergies   DISCHARGE MEDICATIONS: Current Discharge Medication List    CONTINUE these medications which have CHANGED   Details  lactulose (CHRONULAC) 10 GM/15ML solution Take 45 mLs (30 g total) by mouth 3 (three) times daily. Titrate to 3-4 BM's day Qty: 240 mL, Refills: 3    promethazine (PHENERGAN) 12.5 MG tablet Take 0.5 tablets (6.25 mg total) by mouth every 8 (eight) hours as needed for nausea or vomiting. Qty: 30 tablet, Refills: 0      CONTINUE these medications which have NOT CHANGED   Details  b complex-vitamin c-folic acid (NEPHRO-VITE) 0.8 MG TABS tablet Take 1 tablet by mouth daily. Qty: 30 tablet, Refills: 6    cyanocobalamin (,VITAMIN B-12,) 1000 MCG/ML injection Inject 1,000 mcg into the muscle every 30 (thirty) days. 1st of every month    digoxin (LANOXIN) 0.125 MG tablet Take 1 tablet (0.125 mg total) by mouth 3 (three) times a week. Mon, Wed, Fri Qty: 15 tablet, Refills: 6    feeding supplement, ENSURE ENLIVE, (ENSURE ENLIVE) LIQD Take 237 mLs by mouth 2 (two) times daily between meals. Qty: 237 mL, Refills: 12    ferrous sulfate 325 (65 FE) MG EC tablet Take 325 mg by mouth 2 (two) times daily with a meal.     KLOR-CON M20 20 MEQ tablet TAKE TWO TABLETS BY MOUTH IN THE MORNING AND ONE TABLET IN THE EVENING Qty: 90 tablet, Refills: 1     Magnesium 250 MG TABS Take 1 tablet by mouth daily.    metolazone (ZAROXOLYN) 2.5 MG tablet Take 1 tablet (2.5 mg total) by mouth as needed (for weight of 171 lb or greater). Qty: 30 tablet, Refills: 1    ondansetron (ZOFRAN) 4 MG tablet Take 1 tablet (4 mg total) by mouth every 8 (eight) hours as needed for nausea or vomiting. Qty: 30 tablet, Refills: 5    pantoprazole (PROTONIX) 40 MG tablet TAKE ONE TABLET BY MOUTH ONCE DAILY Qty: 30 tablet, Refills: 0    spironolactone (ALDACTONE) 50 MG tablet Take 50 mg (1 tab) in am and 25 mg (1/2 tab) in pm. Qty: 45 tablet, Refills: 6    SYRINGE-NEEDLE, DISP, 3 ML 25G X 5/8" 3 ML MISC To use with B12 injections Qty: 50 each, Refills: 2    torsemide (DEMADEX) 20 MG tablet Take 4 tablets (80 mg total) by mouth 2 (two) times daily. Qty: 360 tablet, Refills: 3    XIFAXAN 550 MG TABS tablet TAKE ONE TABLET BY MOUTH TWICE DAILY Qty: 60 tablet, Refills: 11         Brief H and P: For complete details please refer to admission H and P, but in brief 81 year old male with history of cirrhosis in the setting of heart failure, hepatic encephalopathy, Heriditary hemorrhagic telangiectasia, anemia due to chronic blood loss requiring transfusions, hyponatremia, chronic diastolic CHF, echo [id number] with EF 45-50 percent, PPM, pulmonary hypertension, stage 4  chronic kidney disease, chronic recurrent epistaxis was seen at [facility] failure clinic on 5/12 and lab work revealed hemoglobin of 6.3 and patient was advised to come to the hospital for transfusion and evaluation.  Hospital Course:   Anemia due to chronic blood loss - Related to hereditary hemorrhagic telangiectasia. Patient gives history of chronic recurrent epistaxis and had this up to day prior to admission. - Spouse reported intermittent dark stools which could be multifactorial i.e. swallowed blood, iron supplements, chronic GI bleeding. - Transfused 2 units of PRBC on night of admission.  Hemoglobin improved from 6.8 > 7.6. Hemoglobin remained stable with no worsening in 24 hours, at his baseline.   Acute hepatic encephalopathy/cirrhosis -Patient was noted to be somnolent and confused and ammonia level was elevated at 121. Patient was placed on lactulose and increase to 30 mg 3 times a day and continued on rifaximin.  -This morning at the time of discharge, per patient's wife at the bedside patient is back to his baseline, appropriately answering all the questions, following all commands. She feels that patient does well at home and she has been titrating his lactulose to 3-4 BM's and rifaximin. Patient's wife feels that he does well at home. Patient and his wife requested patient to be discharged to home.   I recommended then to follow-up with gastroenterology, Dr. Carlean Purl in next week or so.  Chronic recurrent epistaxis - As per spouse, this has been going on for years. No current epistaxis. Follow clinically. Apparently has seen ENT in the remote past.  Chronic diastolic CHF - Seems clinically compensated. Continue diuretics (torsemide and Aldactone). Did receive IV Lasix across blood transfusion. - Continue digoxin.  - Sees Dr. Haroldine Laws, Cards  Hyponatremia - Probably multifactorial related to cirrhosis, heart failure and chronic kidney disease. Relatively stable. Follow BMP in a.m.  Pancytopenia - Secondary to cirrhosis. Follow CBCs closely.  Stage IV chronic kidney disease - Creatinine appears to be at baseline.  Hereditary hemorrhagic telangiectasia    Day of Discharge BP 94/46 mmHg  Pulse 78  Temp(Src) 98.3 F (36.8 C) (Oral)  Resp 18  Ht 6' (1.829 m)  Wt 77.293 kg (170 lb 6.4 oz)  BMI 23.11 kg/m2  SpO2 96%  Physical Exam: General: Alert and awake oriented x3 not in any acute distress. HEENT: anicteric sclera, pupils reactive to light and accommodation CVS: S1-S2 clear no murmur rubs or gallops Chest: clear to auscultation bilaterally, no wheezing  rales or rhonchi Abdomen: soft nontender, nondistended, normal bowel sounds Extremities: no cyanosis, clubbing or edema noted bilaterally Neuro: Cranial nerves II-XII intact, no focal neurological deficits   The results of significant diagnostics from this hospitalization (including imaging, microbiology, ancillary and laboratory) are listed below for reference.    LAB RESULTS: Basic Metabolic Panel:  Recent Labs Lab 04/22/16 0811 04/23/16 0614  NA 128* 130*  K 4.4 4.1  CL 97* 96*  CO2 20* 23  GLUCOSE 85 111*  BUN 48* 45*  CREATININE 2.27* 2.41*  CALCIUM 8.7* 9.0   Liver Function Tests:  Recent Labs Lab 04/22/16 0811 04/23/16 0614  AST 29 30  ALT 22 23  ALKPHOS 123 123  BILITOT 6.2* 3.6*  PROT 5.1* 5.1*  ALBUMIN 2.8* 2.6*    Recent Labs Lab 04/21/16 1519  LIPASE 57*    Recent Labs Lab 04/21/16 1519 04/23/16 0651  AMMONIA 121* 178*   CBC:  Recent Labs Lab 04/22/16 1704 04/23/16 0614  WBC 3.8* 3.9*  HGB 7.5* 7.5*  HCT  24.0* 23.8*  MCV 86.3 87.5  PLT 57* 51*   Cardiac Enzymes: No results for input(s): CKTOTAL, CKMB, CKMBINDEX, TROPONINI in the last 168 hours. BNP: Invalid input(s): POCBNP CBG: No results for input(s): GLUCAP in the last 168 hours.  Significant Diagnostic Studies:  Dg Chest Port 1 View  04/21/2016  CLINICAL DATA:  Shortness of Breath EXAM: PORTABLE CHEST 1 VIEW COMPARISON:  None. FINDINGS: There is no edema or consolidation. Heart is enlarged with pulmonary vascularity within normal limits. Pacemaker lead is attached to the right ventricle. No adenopathy. There are surgical clips in the lower right neck region. IMPRESSION: Cardiomegaly.  No edema or consolidation. Electronically Signed   By: Lowella Grip III M.D.   On: 04/21/2016 15:49    2D ECHO:   Disposition and Follow-up: Discharge Instructions    Diet - low sodium heart healthy    Complete by:  As directed      Increase activity slowly    Complete by:  As directed              DISPOSITION: Home  DISCHARGE FOLLOW-UP Follow-up Information    Follow up with Penni Homans, MD. Schedule an appointment as soon as possible for a visit in 2 weeks.   Specialty:  Family Medicine   Contact information:   [place] [address] [phone]       Follow up with Silvano Rusk, MD. Schedule an appointment as soon as possible for a visit in 2 weeks.   Specialty:  Gastroenterology   Why:  for hospital follow-up   Contact information:   [address] [place] [address] [phone]       Follow up with Glori Bickers, MD. Schedule an appointment as soon as possible for a visit in 2 weeks.   Specialty:  Cardiology   Why:  for hospital follow-up   Contact information:   [place] [address] [phone]        Time spent on Discharge: 25 minutes  Signed:   RAI,RIPUDEEP M.D. [facility] 04/23/2016, 11:05 AM Pager: [phone]

## 2016-04-24 ENCOUNTER — Telehealth: Payer: Self-pay | Admitting: *Deleted

## 2016-04-24 LAB — GLUCOSE, CAPILLARY: Glucose-Capillary: 90 mg/dL (ref 65–99)

## 2016-04-24 NOTE — Telephone Encounter (Signed)
Transition Care Management Follow-up Telephone Call  Patient ID: Hunter [name] MRN: [id number] DOB/AGE: 08/08/1935 81 y.o.  Admit date: 04/21/2016 Discharge date: 04/23/2016  Primary Care Physician: Penni Homans, MD  Discharge Diagnoses:   .Symptomatic anemia . Liver failure ([place]) . Hyponatremia . HHT (hereditary hemorrhagic telangiectasia) ([place]) . Hepatic encephalopathy ([place]) . Cirrhosis ([place]) . Anemia due to chronic blood loss . Chronic diastolic heart failure ([facility])  Consults: None  Recommendations for Outpatient Follow-up:  1. Please repeat CBC/BMET at next visit   DIET: Heart healthy diet   How have you been since you were released from the hospital? "It takes a long time to get discharged, so when we finally got home he slept yesterday but he did eat a nice dinner and he took a shower and he's really been resting more than anything. I think he's doing okay."   Do you understand why you were in the hospital? yes   Do you understand the discharge instructions? yes   Where were you discharged to? Home   Items Reviewed:  Medications reviewed: yes  Allergies reviewed: yes  Dietary changes reviewed: yes, heart healthy  Referrals reviewed: no, none made   Functional Questionnaire:   Activities of Daily Living (ADLs):   He states they are independent in the following: bathing and hygiene, feeding, continence, grooming and dressing States they require assistance with the following: ambulation and toileting-wife states pt is mostly independent but is a little weak since spending 2 days in the bed at the hospital   Any transportation issues/concerns?: no   Any patient concerns? no   Confirmed importance and date/time of follow-up visits scheduled yes  Provider Appointment booked with Dr. Janett Billow Copland 05/01/16 @ 11:30am  Confirmed with patient if condition begins to worsen call PCP or go to the ER.  Patient was given the office number and  encouraged to call back with question or concerns.  : yes

## 2016-04-28 ENCOUNTER — Telehealth (HOSPITAL_COMMUNITY): Payer: Self-pay | Admitting: *Deleted

## 2016-04-28 NOTE — Telephone Encounter (Signed)
Amy, RN left VM earlier to get re-cert orders for pt.  Called back and left VO on her ID VM to recert

## 2016-04-29 ENCOUNTER — Emergency Department (HOSPITAL_COMMUNITY): Payer: Medicare Other

## 2016-04-29 ENCOUNTER — Telehealth: Payer: Self-pay | Admitting: Physician Assistant

## 2016-04-29 ENCOUNTER — Inpatient Hospital Stay (HOSPITAL_COMMUNITY)
Admission: EM | Admit: 2016-04-29 | Discharge: 2016-05-05 | DRG: 981 | Disposition: A | Discharge status: Home Health | Payer: Medicare Other | Attending: Internal Medicine | Admitting: Internal Medicine

## 2016-04-29 ENCOUNTER — Encounter (HOSPITAL_COMMUNITY): Payer: Self-pay | Admitting: *Deleted

## 2016-04-29 DIAGNOSIS — D500 Iron deficiency anemia secondary to blood loss (chronic): Secondary | ICD-10-CM | POA: Diagnosis not present

## 2016-04-29 DIAGNOSIS — K761 Chronic passive congestion of liver: Secondary | ICD-10-CM | POA: Diagnosis present

## 2016-04-29 DIAGNOSIS — K7682 Hepatic encephalopathy: Secondary | ICD-10-CM

## 2016-04-29 DIAGNOSIS — E871 Hypo-osmolality and hyponatremia: Secondary | ICD-10-CM | POA: Diagnosis present

## 2016-04-29 DIAGNOSIS — Z66 Do not resuscitate: Secondary | ICD-10-CM | POA: Diagnosis present

## 2016-04-29 DIAGNOSIS — D6959 Other secondary thrombocytopenia: Secondary | ICD-10-CM | POA: Diagnosis present

## 2016-04-29 DIAGNOSIS — D62 Acute posthemorrhagic anemia: Secondary | ICD-10-CM | POA: Diagnosis not present

## 2016-04-29 DIAGNOSIS — D61818 Other pancytopenia: Secondary | ICD-10-CM | POA: Diagnosis present

## 2016-04-29 DIAGNOSIS — I5033 Acute on chronic diastolic (congestive) heart failure: Secondary | ICD-10-CM | POA: Diagnosis present

## 2016-04-29 DIAGNOSIS — I4891 Unspecified atrial fibrillation: Secondary | ICD-10-CM | POA: Diagnosis present

## 2016-04-29 DIAGNOSIS — Z8249 Family history of ischemic heart disease and other diseases of the circulatory system: Secondary | ICD-10-CM | POA: Diagnosis not present

## 2016-04-29 DIAGNOSIS — K449 Diaphragmatic hernia without obstruction or gangrene: Secondary | ICD-10-CM | POA: Diagnosis present

## 2016-04-29 DIAGNOSIS — I272 Other secondary pulmonary hypertension: Secondary | ICD-10-CM | POA: Diagnosis present

## 2016-04-29 DIAGNOSIS — Z85820 Personal history of malignant melanoma of skin: Secondary | ICD-10-CM | POA: Diagnosis not present

## 2016-04-29 DIAGNOSIS — J9601 Acute respiratory failure with hypoxia: Secondary | ICD-10-CM | POA: Diagnosis not present

## 2016-04-29 DIAGNOSIS — K31811 Angiodysplasia of stomach and duodenum with bleeding: Secondary | ICD-10-CM | POA: Diagnosis present

## 2016-04-29 DIAGNOSIS — I780 Hereditary hemorrhagic telangiectasia: Secondary | ICD-10-CM | POA: Diagnosis present

## 2016-04-29 DIAGNOSIS — K7460 Unspecified cirrhosis of liver: Secondary | ICD-10-CM | POA: Diagnosis not present

## 2016-04-29 DIAGNOSIS — I5032 Chronic diastolic (congestive) heart failure: Secondary | ICD-10-CM | POA: Diagnosis not present

## 2016-04-29 DIAGNOSIS — R0989 Other specified symptoms and signs involving the circulatory and respiratory systems: Secondary | ICD-10-CM | POA: Diagnosis not present

## 2016-04-29 DIAGNOSIS — K920 Hematemesis: Secondary | ICD-10-CM | POA: Diagnosis present

## 2016-04-29 DIAGNOSIS — K7469 Other cirrhosis of liver: Secondary | ICD-10-CM

## 2016-04-29 DIAGNOSIS — R040 Epistaxis: Secondary | ICD-10-CM | POA: Diagnosis present

## 2016-04-29 DIAGNOSIS — D6489 Other specified anemias: Secondary | ICD-10-CM | POA: Diagnosis not present

## 2016-04-29 DIAGNOSIS — IMO0002 Reserved for concepts with insufficient information to code with codable children: Secondary | ICD-10-CM | POA: Insufficient documentation

## 2016-04-29 DIAGNOSIS — I130 Hypertensive heart and chronic kidney disease with heart failure and stage 1 through stage 4 chronic kidney disease, or unspecified chronic kidney disease: Secondary | ICD-10-CM | POA: Diagnosis present

## 2016-04-29 DIAGNOSIS — N184 Chronic kidney disease, stage 4 (severe): Secondary | ICD-10-CM | POA: Diagnosis present

## 2016-04-29 DIAGNOSIS — K7290 Hepatic failure, unspecified without coma: Secondary | ICD-10-CM | POA: Diagnosis present

## 2016-04-29 DIAGNOSIS — Z950 Presence of cardiac pacemaker: Secondary | ICD-10-CM | POA: Diagnosis not present

## 2016-04-29 DIAGNOSIS — J690 Pneumonitis due to inhalation of food and vomit: Secondary | ICD-10-CM | POA: Diagnosis not present

## 2016-04-29 DIAGNOSIS — N189 Chronic kidney disease, unspecified: Secondary | ICD-10-CM | POA: Diagnosis present

## 2016-04-29 DIAGNOSIS — Z79899 Other long term (current) drug therapy: Secondary | ICD-10-CM | POA: Diagnosis not present

## 2016-04-29 DIAGNOSIS — I770 Arteriovenous fistula, acquired: Secondary | ICD-10-CM | POA: Diagnosis present

## 2016-04-29 HISTORY — DX: Unspecified cirrhosis of liver: K74.60

## 2016-04-29 LAB — PROTIME-INR
INR: 1.34 (ref 0.00–1.49)
PROTHROMBIN TIME: 16.7 s — AB (ref 11.6–15.2)

## 2016-04-29 LAB — URINALYSIS, ROUTINE W REFLEX MICROSCOPIC
BILIRUBIN URINE: NEGATIVE
Glucose, UA: NEGATIVE mg/dL
Hgb urine dipstick: NEGATIVE
KETONES UR: NEGATIVE mg/dL
Leukocytes, UA: NEGATIVE
NITRITE: NEGATIVE
Protein, ur: NEGATIVE mg/dL
SPECIFIC GRAVITY, URINE: 1.011 (ref 1.005–1.030)
pH: 7 (ref 5.0–8.0)

## 2016-04-29 LAB — CBC WITH DIFFERENTIAL/PLATELET
BASOS ABS: 0 10*3/uL (ref 0.0–0.1)
Basophils Relative: 1 %
EOS PCT: 1 %
Eosinophils Absolute: 0.1 10*3/uL (ref 0.0–0.7)
HEMATOCRIT: 23.9 % — AB (ref 39.0–52.0)
Hemoglobin: 7.2 g/dL — ABNORMAL LOW (ref 13.0–17.0)
LYMPHS ABS: 0.3 10*3/uL — AB (ref 0.7–4.0)
LYMPHS PCT: 6 %
MCH: 27 pg (ref 26.0–34.0)
MCHC: 30.1 g/dL (ref 30.0–36.0)
MCV: 89.5 fL (ref 78.0–100.0)
MONO ABS: 0.8 10*3/uL (ref 0.1–1.0)
Monocytes Relative: 21 %
NEUTROS ABS: 2.8 10*3/uL (ref 1.7–7.7)
Neutrophils Relative %: 71 %
PLATELETS: 61 10*3/uL — AB (ref 150–400)
RBC: 2.67 MIL/uL — ABNORMAL LOW (ref 4.22–5.81)
RDW: 18.2 % — AB (ref 11.5–15.5)
WBC: 3.9 10*3/uL — ABNORMAL LOW (ref 4.0–10.5)

## 2016-04-29 LAB — COMPREHENSIVE METABOLIC PANEL
ALT: 26 U/L (ref 17–63)
AST: 36 U/L (ref 15–41)
Albumin: 2.7 g/dL — ABNORMAL LOW (ref 3.5–5.0)
Alkaline Phosphatase: 137 U/L — ABNORMAL HIGH (ref 38–126)
Anion gap: 9 (ref 5–15)
BILIRUBIN TOTAL: 2.3 mg/dL — AB (ref 0.3–1.2)
BUN: 46 mg/dL — AB (ref 6–20)
CHLORIDE: 100 mmol/L — AB (ref 101–111)
CO2: 25 mmol/L (ref 22–32)
CREATININE: 2.43 mg/dL — AB (ref 0.61–1.24)
Calcium: 8.9 mg/dL (ref 8.9–10.3)
GFR, EST AFRICAN AMERICAN: 28 mL/min — AB (ref >60)
GFR, EST NON AFRICAN AMERICAN: 24 mL/min — AB (ref >60)
Glucose, Bld: 127 mg/dL — ABNORMAL HIGH (ref 65–99)
Potassium: 4.4 mmol/L (ref 3.5–5.1)
Sodium: 134 mmol/L — ABNORMAL LOW (ref 135–145)
TOTAL PROTEIN: 5.1 g/dL — AB (ref 6.5–8.1)

## 2016-04-29 LAB — AMMONIA: AMMONIA: 83 umol/L — AB (ref 9–35)

## 2016-04-29 LAB — PREPARE RBC (CROSSMATCH)

## 2016-04-29 MED ORDER — RIFAXIMIN 550 MG PO TABS
550.0000 mg | ORAL_TABLET | Freq: Two times a day (BID) | ORAL | Status: DC
  Administered 2016-04-30 – 2016-05-01 (×3): 550 mg via ORAL
  Filled 2016-04-29 (×4): qty 1

## 2016-04-29 MED ORDER — MAGNESIUM 250 MG PO TABS: 250.0000 mg | ORAL_TABLET | Freq: Every day | ORAL | Status: DC

## 2016-04-29 MED ORDER — SODIUM CHLORIDE 0.9 % IV SOLN: 10.0000 mL/h | Freq: Once | INTRAVENOUS | Status: DC

## 2016-04-29 MED ORDER — FERROUS SULFATE 325 (65 FE) MG PO TABS
325.0000 mg | ORAL_TABLET | Freq: Two times a day (BID) | ORAL | Status: DC
  Administered 2016-04-30 (×2): 325 mg via ORAL
  Filled 2016-04-29 (×2): qty 1

## 2016-04-29 MED ORDER — DIPHENHYDRAMINE HCL 25 MG PO CAPS
25.0000 mg | ORAL_CAPSULE | Freq: Once | ORAL | Status: AC
  Administered 2016-04-30: 25 mg via ORAL
  Filled 2016-04-29: qty 1

## 2016-04-29 MED ORDER — AYR SALINE NASAL NA GEL: 1.0000 "application " | Freq: Two times a day (BID) | NASAL | Status: DC | PRN

## 2016-04-29 MED ORDER — PANTOPRAZOLE SODIUM 40 MG PO TBEC
40.0000 mg | DELAYED_RELEASE_TABLET | Freq: Every day | ORAL | Status: DC
  Administered 2016-04-30: 40 mg via ORAL
  Filled 2016-04-29: qty 1

## 2016-04-29 MED ORDER — ONDANSETRON HCL 4 MG PO TABS: 4.0000 mg | ORAL_TABLET | Freq: Three times a day (TID) | ORAL | Status: DC | PRN

## 2016-04-29 MED ORDER — SODIUM CHLORIDE 0.9% FLUSH
3.0000 mL | Freq: Two times a day (BID) | INTRAVENOUS | Status: DC
  Administered 2016-04-30 (×2): 3 mL via INTRAVENOUS

## 2016-04-29 MED ORDER — ACETAMINOPHEN 650 MG RE SUPP: 650.0000 mg | Freq: Four times a day (QID) | RECTAL | Status: DC | PRN

## 2016-04-29 MED ORDER — ACETAMINOPHEN 325 MG PO TABS: 650.0000 mg | ORAL_TABLET | Freq: Four times a day (QID) | ORAL | Status: DC | PRN

## 2016-04-29 MED ORDER — ONDANSETRON HCL 4 MG/2ML IJ SOLN
4.0000 mg | Freq: Once | INTRAMUSCULAR | Status: AC
  Administered 2016-04-29: 4 mg via INTRAVENOUS
  Filled 2016-04-29: qty 2

## 2016-04-29 MED ORDER — SODIUM CHLORIDE 0.9 % IV SOLN
Freq: Once | INTRAVENOUS | Status: AC
  Administered 2016-04-30: 08:00:00 via INTRAVENOUS

## 2016-04-29 MED ORDER — SPIRONOLACTONE 25 MG PO TABS: 25.0000 mg | ORAL_TABLET | Freq: Two times a day (BID) | ORAL | Status: DC

## 2016-04-29 MED ORDER — DIGOXIN 125 MCG PO TABS
0.1250 mg | ORAL_TABLET | ORAL | Status: DC
  Administered 2016-05-01: 0.125 mg via ORAL
  Filled 2016-04-29: qty 1

## 2016-04-29 MED ORDER — TORSEMIDE 20 MG PO TABS
80.0000 mg | ORAL_TABLET | Freq: Two times a day (BID) | ORAL | Status: DC
  Administered 2016-04-30: 80 mg via ORAL
  Filled 2016-04-29: qty 4

## 2016-04-29 MED ORDER — LACTULOSE 10 GM/15ML PO SOLN
30.0000 g | Freq: Four times a day (QID) | ORAL | Status: DC
  Administered 2016-04-30 – 2016-05-01 (×6): 30 g via ORAL
  Filled 2016-04-29 (×6): qty 45

## 2016-04-29 MED ORDER — SODIUM CHLORIDE 0.9 % IV SOLN
10.0000 mL/h | Freq: Once | INTRAVENOUS | Status: AC
  Administered 2016-04-29: 10 mL/h via INTRAVENOUS

## 2016-04-29 MED ORDER — LACTULOSE 10 GM/15ML PO SOLN
30.0000 g | Freq: Once | ORAL | Status: AC
  Administered 2016-04-29: 30 g via ORAL
  Filled 2016-04-29: qty 45

## 2016-04-29 NOTE — ED Notes (Signed)
Phlebotomy at bedside.

## 2016-04-29 NOTE — ED Provider Notes (Signed)
CSN: [id number]     Arrival date & time 04/29/16  1753 History   First MD Initiated Contact with Patient 04/29/16 1804     Chief Complaint  Patient presents with  . Gait Problem  . Altered Mental Status     (Consider location/radiation/quality/duration/timing/severity/associated sxs/prior Treatment) The history is provided by the patient.  Hunter [name] is a 81 y.o. male hx of CHF, HHT, osler-weber-rendu syndrome here with nose bleed. Patient had an episode nosebleed yesterday and swallowed the blood and vomited up. Denies any abdominal pain. Patient has frequent nose bleeds and required transfusion several days ago in the hospital. He also has been more confused today and wife states that she is concerned for possible hepatic encephalopathy. He is taking lactulose twice daily. She also noticed that he is falling more to the left and had trouble coming up with words this morning. Denies fevers.    Past Medical History  Diagnosis Date  . CHF (congestive heart failure) ([place])   . Pacemaker     single lead Medtronic pacemaker  . HHT (hereditary hemorrhagic telangiectasia) ([place])   . Anemia   . Pulmonary hypertension ([place])   . 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 Renal insufficiency   . Liver failure ([place])   . Atrial fibrillation ([place])   . Hepatic AV fistula ([place]) 08/09/2015  . 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 Anemia due to chronic blood loss 08/09/2015  . Right heart failure ([place]) 08/09/2015  . Tricuspid regurgitation 08/09/2015  . 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 Melanoma ([place])     2 on nose, 1 on back, 1 on leg   Past Surgical History  Procedure Laterality Date  . Pacemaker insertion    . Colon surgery  1196  . Esophagogastroduodenoscopy N/A 08/13/2015    Procedure: ESOPHAGOGASTRODUODENOSCOPY (EGD);  Surgeon: Ladene Artist, MD;  Location: [facility] ENDOSCOPY;  Service: Endoscopy;  Laterality: N/A;  . Tonsillectomy Bilateral    Family History  Problem Relation Age of Onset  . Other  Mother     HHT  . Other Son     HHT  . Heart failure Father   . Heart disease Sister     afib, pacer  . Arthritis Brother     b/l TKR  . Other Brother   . Heart disease Sister     pacer   Social History  Substance Use Topics  . Smoking status: Never Smoker   . Smokeless tobacco: Never Used  . Alcohol Use: No    Review of Systems  HENT: Positive for nosebleeds.   All other systems reviewed and are negative.     Allergies  Review of patient's allergies indicates no known allergies.  Home Medications   Prior to Admission medications   Medication Sig Start Date End Date Taking? Authorizing Provider  b complex-vitamin c-folic acid (NEPHRO-VITE) 0.8 MG TABS tablet Take 1 tablet by mouth daily. 10/18/15  Yes Mosie Lukes, MD  cyanocobalamin (,VITAMIN B-12,) 1000 MCG/ML injection Inject 1,000 mcg into the muscle every 30 (thirty) days. 1st of every month   Yes Historical Provider, MD  digoxin (LANOXIN) 0.125 MG tablet Take 1 tablet (0.125 mg total) by mouth 3 (three) times a week. Jory Sims, Fri 09/02/15  Yes Shaune Pascal Bensimhon, MD  feeding supplement, ENSURE ENLIVE, (ENSURE ENLIVE) LIQD Take 237 mLs by mouth 2 (two) times daily between meals. 08/15/15  Yes Olam Idler, MD  ferrous sulfate 325 (65 FE) MG EC tablet Take 325 mg by  mouth 2 (two) times daily with a meal.    Yes Historical Provider, MD  KLOR-CON M20 20 MEQ tablet TAKE TWO TABLETS BY MOUTH IN THE MORNING AND ONE TABLET IN THE EVENING 03/29/16  Yes Shaune Pascal Bensimhon, MD  lactulose (CHRONULAC) 10 GM/15ML solution Take 45 mLs (30 g total) by mouth 3 (three) times daily. Titrate to 3-4 BM's day 04/23/16  Yes Ripudeep Krystal Eaton, MD  Magnesium 250 MG TABS Take 250 mg by mouth daily.    Yes Historical Provider, MD  metolazone (ZAROXOLYN) 2.5 MG tablet Take 1 tablet (2.5 mg total) by mouth as needed (for weight of 171 lb or greater). 03/27/16  Yes Shaune Pascal Bensimhon, MD  ondansetron (ZOFRAN) 4 MG tablet Take 1 tablet (4 mg total) by  mouth every 8 (eight) hours as needed for nausea or vomiting. 09/20/15  Yes Gatha Mayer, MD  pantoprazole (PROTONIX) 40 MG tablet TAKE ONE TABLET BY MOUTH ONCE DAILY 04/20/16  Yes Jolaine Artist, MD  promethazine (PHENERGAN) 12.5 MG tablet Take 0.5 tablets (6.25 mg total) by mouth every 8 (eight) hours as needed for nausea or vomiting. Patient taking differently: Take 6.25 mg by mouth daily as needed for nausea or vomiting.  04/23/16  Yes Ripudeep K Rai, MD  saline (AYR) GEL Place 1 application into the nose 2 (two) times daily as needed (for nose bleeds).   Yes Historical Provider, MD  spironolactone (ALDACTONE) 50 MG tablet Take 50 mg (1 tab) in am and 25 mg (1/2 tab) in pm. 03/20/16  Yes Jolaine Artist, MD  SYRINGE-NEEDLE, DISP, 3 ML 25G X 5/8" 3 ML MISC To use with B12 injections 03/09/16  Yes Mosie Lukes, MD  torsemide (DEMADEX) 20 MG tablet Take 4 tablets (80 mg total) by mouth 2 (two) times daily. 03/20/16  Yes Shaune Pascal Bensimhon, MD  XIFAXAN 550 MG TABS tablet TAKE ONE TABLET BY MOUTH TWICE DAILY 10/28/15  Yes Gatha Mayer, MD   BP 106/52 mmHg  Pulse 79  Temp(Src) 98.2 F (36.8 C) (Oral)  Resp 17  SpO2 100% Physical Exam  Constitutional: He appears well-developed and well-nourished.  HENT:  Head: Normocephalic.  Mouth/Throat: Oropharynx is clear and moist.  Dry blood right nostril, no active bleeding   Eyes: Conjunctivae are normal. Pupils are equal, round, and reactive to light.  Neck: Normal range of motion. Neck supple.  Cardiovascular: Normal rate, regular rhythm and normal heart sounds.   Pulmonary/Chest: Effort normal and breath sounds normal.  Abdominal: Soft. Bowel sounds are normal. He exhibits no distension. There is no tenderness. There is no rebound.  Musculoskeletal: Normal range of motion.  Neurological: He is alert.  Confused. ? [place]ix[place] vs tremors. No obvious facial droop. Strength 4/5 throughout.   Skin: Skin is warm and dry.  Psychiatric: He has a  normal mood and affect. His behavior is normal. Judgment and thought content normal.  Nursing note and vitals reviewed.   ED Course  Procedures (including critical care time)  CRITICAL CARE Performed by: Darl Householder, Nyisha Clippard   Total critical care time: 30 minutes  Critical care time was exclusive of separately billable procedures and treating other patients.  Critical care was necessary to treat or prevent imminent or life-threatening deterioration.  Critical care was time spent personally by me on the following activities: development of treatment plan with patient and/or surrogate as well as nursing, discussions with consultants, evaluation of patient's response to treatment, examination of patient, obtaining history from patient or  surrogate, ordering and performing treatments and interventions, ordering and review of laboratory studies, ordering and review of radiographic studies, pulse oximetry and re-evaluation of patient's condition.   Labs Review Labs Reviewed  CBC WITH DIFFERENTIAL/PLATELET - Abnormal; Notable for the following:    WBC 3.9 (*)    RBC 2.67 (*)    Hemoglobin 7.2 (*)    HCT 23.9 (*)    RDW 18.2 (*)    Platelets 61 (*)    Lymphs Abs 0.3 (*)    All other components within normal limits  COMPREHENSIVE METABOLIC PANEL - Abnormal; Notable for the following:    Sodium 134 (*)    Chloride 100 (*)    Glucose, Bld 127 (*)    BUN 46 (*)    Creatinine, Ser 2.43 (*)    Total Protein 5.1 (*)    Albumin 2.7 (*)    Alkaline Phosphatase 137 (*)    Total Bilirubin 2.3 (*)    GFR calc non Af Amer 24 (*)    GFR calc Af Amer 28 (*)    All other components within normal limits  PROTIME-INR - Abnormal; Notable for the following:    Prothrombin Time 16.7 (*)    All other components within normal limits  AMMONIA - Abnormal; Notable for the following:    Ammonia 83 (*)    All other components within normal limits  TYPE AND SCREEN  PREPARE RBC (CROSSMATCH)    Imaging  Review Ct Head Wo Contrast  04/29/2016  CLINICAL DATA:  Acute onset of altered mental status. Generalized weakness, unsteady gait and aphasia. Initial encounter. EXAM: CT HEAD WITHOUT CONTRAST TECHNIQUE: Contiguous axial images were obtained from the base of the skull through the vertex without intravenous contrast. COMPARISON:  None. FINDINGS: There is no evidence of acute infarction, mass lesion, or intra- or extra-axial hemorrhage on CT. Prominence of the ventricles and sulci reflects moderate cortical volume loss. Cerebellar atrophy is noted. Scattered periventricular white matter change likely reflects small vessel ischemic microangiopathy. The brainstem and fourth ventricle are within normal limits. The basal ganglia are unremarkable in appearance. The cerebral hemispheres demonstrate grossly normal gray-white differentiation. No mass effect or midline shift is seen. There is no evidence of fracture; visualized osseous structures are unremarkable in appearance. The orbits are within normal limits. There is partial opacification of the right maxillary sinus. The remaining paranasal sinuses and mastoid air cells are well-aerated. No significant soft tissue abnormalities are seen. IMPRESSION: 1. No acute intracranial pathology seen on CT. 2. Moderate cortical volume loss and scattered small vessel ischemic microangiopathy. 3. Partial opacification of the right maxillary sinus. Electronically Signed   By: Garald Balding M.D.   On: 04/29/2016 19:59   I have personally reviewed and evaluated these images and lab results as part of my medical decision-making.   EKG Interpretation None      MDM   Final diagnoses:  None   Hunter [name] is a 81 y.o. male here with recurrent nose bleed, confusion. Will check CBC. Will get CT head, ammonia level.   8:20 PM CT head showed no bleed. Hg 7.2, was 7,5 before. Ordered 1 U PRBC. His doctor wants his Hg around 8. Ammonia 83, given lactulose for hepatic  encephalopathy. Will admit for anemia, hepatic encephalopathy.       Wandra Arthurs, MD 04/29/16 2021

## 2016-04-29 NOTE — Telephone Encounter (Signed)
Patient's wife called to reported Hunter [name] is very fatigued and weak since last night when he began having a nose bleed again and hematemesis.   He can barely walk due to weakness. I advised he come to the ER.    Tarri Fuller [facility]

## 2016-04-29 NOTE — H&P (Signed)
Hunter [name] [id number] DOB: [birth year] DOA: 04/29/2016   PCP: Penni Homans, MD   Outpatient Specialists: [facility], GI Carlean Purl,  Patient coming from: home  Chief Complaint: generalized fatigue and nosebleed  HPI: Hunter [name] is a 81 y.o. male with medical history significant of  Hepatic cirrhosis with history of hereditary hemorrhagic telangiectasia with chronic anemia due to chronic blood loss,  Chronic diastolic heart failure,  sp pacemaker,  recurrent hepatic encephalopathy chronic kidney disease recurrent epistaxis vomiting hypertension  Presented with patient started to have nosebleeds since yesterday thereafter he developed hematemesis likely secondary to ingesting large amount of blood from the nosebleed. He developed generalized fatigue wife was concerned he was leaning to her left.  He walks with a walker and was puling it to the side. Not associated with facial droop. No significant slurred speech but had word finding difficulty at 10 AM this was very brief. He was oriented at that Wife states that when his ammonia goes up patient becomes somewhat more confused  and fatigued. He has chronically loose stools since on lactulose.     Regarding pertinent Chronic problems: recently was admitted for symptomatic anemia  on 12th of May he was admitted and transfused 2 units with his ongoing going up from 6.8-7.6. During his admission he was noted to have hepatic encephalopathy with ammonia level after 121 he was somnolent and confused at that time his lactulose was increased his rifaximin was continued he was discharged home with plan to follow up Dr. Carlean Purl next week. He has recurrent epistaxis has been going on for years in the past and been seen by ENT. last echogram was in August 2016 with EF of 45-50 percent  IN ER: afebrile heart rate 79  Blood pressure down to 99/52 WBC 3.9 hemoglobin 7.2 down from discharge hemoglobin on May 14 of 7.6,  platelets 61 sodium 134  , BUN 46 creatinine at baseline 2.43 INR 1.3. Ammonia elevated to 83  CT head non-acute    Hospitalist was called for admission for hepatic encephalopathy and worsening anemia  Review of Systems:    Pertinent positives include: fatigue,  Epistaxis. Hematemesis, nausea, vomiting,   Constitutional:  No weight loss, night sweats, Fevers, chills, weight loss  HEENT:  No headaches, Difficulty swallowing,Tooth/dental problems,Sore throat,  No sneezing, itching, ear ache, nasal congestion, post nasal drip,  Cardio-vascular:  No chest pain, Orthopnea, PND, anasarca, dizziness, palpitations.no Bilateral lower extremity swelling  GI:  No heartburn, indigestion, abdominal pain, diarrhea, change in bowel habits, loss of appetite, melena, blood in stool,   Resp:  no shortness of breath at rest. No dyspnea on exertion, No excess mucus, no productive cough, No non-productive cough, No coughing up of blood.No change in color of mucus.No wheezing. Skin:  no rash or lesions. No jaundice GU:  no dysuria, change in color of urine, no urgency or frequency. No straining to urinate.  No flank pain.  Musculoskeletal:  No joint pain or no joint swelling. No decreased range of motion. No back pain.  Psych:  No change in mood or affect. No depression or anxiety. No memory loss.  Neuro: no localizing neurological complaints, no tingling, no weakness, no double vision, no gait abnormality, no slurred speech, no confusion  As per HPI otherwise 10 point review of systems negative.   Past Medical History: Past Medical History  Diagnosis Date  . CHF (congestive heart failure) ([place])   . Pacemaker     single lead Medtronic pacemaker  .  HHT (hereditary hemorrhagic telangiectasia) ([place])   . Anemia   . Pulmonary hypertension ([place])   . 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 Renal insufficiency   . Liver failure ([place])   . Atrial fibrillation ([place])   . Hepatic AV fistula ([place]) 08/09/2015  . Cardiac cirrhosis  08/09/2015  . Anemia due to chronic blood loss 08/09/2015  . Right heart failure ([place]) 08/09/2015  . Tricuspid regurgitation 08/09/2015  . 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 Melanoma ([place])     2 on nose, 1 on back, 1 on leg   Past Surgical History  Procedure Laterality Date  . Pacemaker insertion    . Colon surgery  1196  . Esophagogastroduodenoscopy N/A 08/13/2015    Procedure: ESOPHAGOGASTRODUODENOSCOPY (EGD);  Surgeon: Ladene Artist, MD;  Location: [facility]COPY;  Service: Endoscopy;  Laterality: N/A;  . Tonsillectomy Bilateral      Social History:  Ambulatory  walker   Lives at home    With family     reports that he has never smoked. He has never used smokeless tobacco. He reports that he does not drink alcohol or use illicit drugs.  Allergies:  No Known Allergies     Family History:    Family History  Problem Relation Age of Onset  . Other Mother     HHT  . Other Son     HHT  . Heart failure Father   . Heart disease Sister     afib, pacer  . Arthritis Brother     b/l TKR  . Other Brother   . Heart disease Sister     pacer    Medications: Prior to Admission medications   Medication Sig Start Date End Date Taking? Authorizing Provider  b complex-vitamin c-folic acid (NEPHRO-VITE) 0.8 MG TABS tablet Take 1 tablet by mouth daily. 10/18/15  Yes Mosie Lukes, MD  cyanocobalamin (,VITAMIN B-12,) 1000 MCG/ML injection Inject 1,000 mcg into the muscle every 30 (thirty) days. 1st of every month   Yes Historical Provider, MD  digoxin (LANOXIN) 0.125 MG tablet Take 1 tablet (0.125 mg total) by mouth 3 (three) times a week. Jory Sims, Fri 09/02/15  Yes Shaune Pascal Bensimhon, MD  feeding supplement, ENSURE ENLIVE, (ENSURE ENLIVE) LIQD Take 237 mLs by mouth 2 (two) times daily between meals. 08/15/15  Yes Olam Idler, MD  ferrous sulfate 325 (65 FE) MG EC tablet Take 325 mg by mouth 2 (two) times daily with a meal.    Yes Historical Provider, MD  KLOR-CON M20 20 MEQ tablet  TAKE TWO TABLETS BY MOUTH IN THE MORNING AND ONE TABLET IN THE EVENING 03/29/16  Yes Shaune Pascal Bensimhon, MD  lactulose (CHRONULAC) 10 GM/15ML solution Take 45 mLs (30 g total) by mouth 3 (three) times daily. Titrate to 3-4 BM's day 04/23/16  Yes Ripudeep Krystal Eaton, MD  Magnesium 250 MG TABS Take 250 mg by mouth daily.    Yes Historical Provider, MD  metolazone (ZAROXOLYN) 2.5 MG tablet Take 1 tablet (2.5 mg total) by mouth as needed (for weight of 171 lb or greater). 03/27/16  Yes Shaune Pascal Bensimhon, MD  ondansetron (ZOFRAN) 4 MG tablet Take 1 tablet (4 mg total) by mouth every 8 (eight) hours as needed for nausea or vomiting. 09/20/15  Yes Gatha Mayer, MD  pantoprazole (PROTONIX) 40 MG tablet TAKE ONE TABLET BY MOUTH ONCE DAILY 04/20/16  Yes Jolaine Artist, MD  promethazine (PHENERGAN) 12.5 MG tablet Take 0.5 tablets (6.25 mg  total) by mouth every 8 (eight) hours as needed for nausea or vomiting. Patient taking differently: Take 6.25 mg by mouth daily as needed for nausea or vomiting.  04/23/16  Yes Ripudeep K Rai, MD  saline (AYR) GEL Place 1 application into the nose 2 (two) times daily as needed (for nose bleeds).   Yes Historical Provider, MD  spironolactone (ALDACTONE) 50 MG tablet Take 50 mg (1 tab) in am and 25 mg (1/2 tab) in pm. 03/20/16  Yes Jolaine Artist, MD  SYRINGE-NEEDLE, DISP, 3 ML 25G X 5/8" 3 ML MISC To use with B12 injections 03/09/16  Yes Mosie Lukes, MD  torsemide (DEMADEX) 20 MG tablet Take 4 tablets (80 mg total) by mouth 2 (two) times daily. 03/20/16  Yes Jolaine Artist, MD  XIFAXAN 550 MG TABS tablet TAKE ONE TABLET BY MOUTH TWICE DAILY 10/28/15  Yes Gatha Mayer, MD    Physical Exam: Patient Vitals for the past 24 hrs:  BP Temp Temp src Pulse Resp SpO2  04/29/16 2015 119/56 mmHg - - 83 14 100 %  04/29/16 2000 (!) 100/48 mmHg - - 80 19 100 %  04/29/16 1900 (!) 106/52 mmHg - - 79 17 100 %  04/29/16 1845 (!) 99/52 mmHg - - 83 22 100 %  04/29/16 1830 126/70 mmHg -  - 79 18 100 %  04/29/16 1815 (!) 112/45 mmHg - - 80 14 100 %  04/29/16 1805 (!) 116/48 mmHg 98.2 F (36.8 C) Oral 83 18 100 %    1. General:  in No Acute distress 2. Psychological: lethargic and   Oriented to self 3. Head/ENT:    Dry Mucous Membranes                          Head Non traumatic, neck supple                          Normal Dentition 4. SKIN:   decreased Skin turgor,  Skin clean Dry and intact no rash 5. Heart: Regular rate and rhythm no  Murmur, Rub or gallop 6. Lungs:  Clear to auscultation bilaterally, no wheezes or crackles   7. Abdomen: Soft, non-tender, Non distended 8. Lower extremities: no clubbing, cyanosis, or edema 9. Neurologically Grossly intact, moving all 4 extremities equally, appear to have equal grips, asterixis present, cranial nerves appear to be intact 10. MSK: Normal range of motion   body mass index is unknown because there is no weight on file.  Labs on Admission:   Labs on Admission: I have personally reviewed following labs and imaging studies  CBC:  Recent Labs Lab 04/23/16 0614 04/29/16 1857  WBC 3.9* 3.9*  NEUTROABS  --  2.8  HGB 7.5* 7.2*  HCT 23.8* 23.9*  MCV 87.5 89.5  PLT 51* 61*   Basic Metabolic Panel:  Recent Labs Lab 04/23/16 0614 04/29/16 1857  NA 130* 134*  K 4.1 4.4  CL 96* 100*  CO2 23 25  GLUCOSE 111* 127*  BUN 45* 46*  CREATININE 2.41* 2.43*  CALCIUM 9.0 8.9   GFR: Estimated Creatinine Clearance: 27.4 mL/min (by C-G formula based on Cr of 2.43). Liver Function Tests:  Recent Labs Lab 04/23/16 0614 04/29/16 1857  AST 30 36  ALT 23 26  ALKPHOS 123 137*  BILITOT 3.6* 2.3*  PROT 5.1* 5.1*  ALBUMIN 2.6* 2.7*   No results for input(s): LIPASE, AMYLASE  in the last 168 hours.  Recent Labs Lab 04/23/16 0651 04/29/16 1857  AMMONIA 178* 83*   Coagulation Profile:  Recent Labs Lab 04/29/16 1857  INR 1.34   Cardiac Enzymes: No results for input(s): CKTOTAL, CKMB, CKMBINDEX, TROPONINI in  the last 168 hours. BNP (last 3 results)  Recent Labs  08/09/15 1621  PROBNP 188.0*   HbA1C: No results for input(s): HGBA1C in the last 72 hours. CBG: No results for input(s): GLUCAP in the last 168 hours. Lipid Profile: No results for input(s): CHOL, HDL, LDLCALC, TRIG, CHOLHDL, LDLDIRECT in the last 72 hours. Thyroid Function Tests: No results for input(s): TSH, T4TOTAL, FREET4, T3FREE, THYROIDAB in the last 72 hours. Anemia Panel: No results for input(s): VITAMINB12, FOLATE, FERRITIN, TIBC, IRON, RETICCTPCT in the last 72 hours. Urine analysis:    Component Value Date/Time   COLORURINE YELLOW 04/21/2016 [place] 04/21/2016 1704   LABSPEC 1.010 04/21/2016 1704   PHURINE 7.0 04/21/2016 1704   GLUCOSEU NEGATIVE 04/21/2016 1704   HGBUR NEGATIVE 04/21/2016 1704   BILIRUBINUR NEGATIVE 04/21/2016 1704   KETONESUR NEGATIVE 04/21/2016 1704   PROTEINUR NEGATIVE 04/21/2016 1704   UROBILINOGEN 0.2 08/10/2015 1700   NITRITE NEGATIVE 04/21/2016 1704   LEUKOCYTESUR NEGATIVE 04/21/2016 1704   Sepsis Labs: @LABRCNTIP (procalcitonin:4,lacticidven:4) )No results found for this or any previous visit (from the past 240 hour(s)).    UA ordered  No results found for: HGBA1C  Estimated Creatinine Clearance: 27.4 mL/min (by C-G formula based on Cr of 2.43).  BNP (last 3 results)  Recent Labs  08/09/15 1621  PROBNP 188.0*     ECG REPORT  Independently reviewed Rate:83  Rhythm: paced ST&T Change N/A QTC 511  There were no vitals filed for this visit.   Cultures: No results found for: SDES, SPECREQUEST, CULT, REPTSTATUS   Radiological Exams on Admission: Ct Head Wo Contrast  04/29/2016  CLINICAL DATA:  Acute onset of altered mental status. Generalized weakness, unsteady gait and aphasia. Initial encounter. EXAM: CT HEAD WITHOUT CONTRAST TECHNIQUE: Contiguous axial images were obtained from the base of the skull through the vertex without intravenous  contrast. COMPARISON:  None. FINDINGS: There is no evidence of acute infarction, mass lesion, or intra- or extra-axial hemorrhage on CT. Prominence of the ventricles and sulci reflects moderate cortical volume loss. Cerebellar atrophy is noted. Scattered periventricular white matter change likely reflects small vessel ischemic microangiopathy. The brainstem and fourth ventricle are within normal limits. The basal ganglia are unremarkable in appearance. The cerebral hemispheres demonstrate grossly normal gray-white differentiation. No mass effect or midline shift is seen. There is no evidence of fracture; visualized osseous structures are unremarkable in appearance. The orbits are within normal limits. There is partial opacification of the right maxillary sinus. The remaining paranasal sinuses and mastoid air cells are well-aerated. No significant soft tissue abnormalities are seen. IMPRESSION: 1. No acute intracranial pathology seen on CT. 2. Moderate cortical volume loss and scattered small vessel ischemic microangiopathy. 3. Partial opacification of the right maxillary sinus. Electronically Signed   By: Garald Balding M.D.   On: 04/29/2016 19:59    Chart has been reviewed    Assessment/Plan   81 y.o. male with medical history significant of  Hepatic cirrhosis with history of hereditary hemorrhagic telangiectasia with chronic anemia due to chronic blood loss,  Chronic diastolic heart failure,  sp pacemaker,  recurrent hepatic encephalopathy, chronic kidney disease, recurrent epistaxis, ,hypertension ere with acute encephalopathy in the setting of epistaxis and symptomatic anemia requiring  blood transfusion   Present on Admission:  . Anemia due to chronic blood loss -transfuse 2 units follow CBC . Chronic diastolic heart failure ([place]) - given that patient appears to be currently volume down will hold torsemide for tonight to restart tomorrow . HHT (hereditary hemorrhagic telangiectasia) ([place]) chronic  currently resulting in multiple medical complications. . Hepatic encephalopathy ([facility]) - increased dose of lactulose likely secondary to recent proteinaceous load after digestion of  Blood due to epistaxis.  Patient had a short episode of word finding difficulty  And later on a short episode of leaning to the left. Currently no  Localizing neurological complaints or findings. CT scan of the head unremarkable. We'll hold off on MRI at this point patient not a candidate for anticoagulation or antiplatelet agents due to significant risk of bleeding. above-mentioned symptoms most likely secondary to  Hepatic encephalopathy  . Chronic kidney disease - chronic creatinine at baseline continue to monitor check orthostatics prior to discharge . Cirrhosis ([place]) - secondary to hereditary Talangiectagia syndrome, will increase lactulose secondary to hepatic encephalopathy continue home medications . S/P placement of cardiac pacemaker - paced.  thrombocytopenia secondary to cirrhosis chronic  Other plan as per orders.  DVT prophylaxis:  SCD     Code Status:    DNR/DNI  s per  family   Family Communication:   Family   at  Bedside  plan of care was discussed with  Wife [name] ([phone] cell Disposition Plan:     To home once workup is complete and patient is stable   Consults called: none  Admission status:    inpatient     Level of care      tele          I have spent a total of 46min on this admission    Stephenie Navejas 04/29/2016, 9:26 PM    [facility]  Pager [phone]   after 2 AM please page floor coverage PA If 7AM-7PM, please contact the day team taking care of the patient  [email or web address]  Password [email or web address]

## 2016-04-29 NOTE — ED Notes (Addendum)
Pt in for unsteady gait, aphasia, & pts family states, "He is going to the left."  Symptom onset 11 am, Pt has bleeding disorder & has chronic nose bleeds, pt had x 1 vomiting of red blood yesterday, pt required blood transfusions last week, pt alert to self

## 2016-04-30 ENCOUNTER — Encounter (HOSPITAL_COMMUNITY): Payer: Self-pay | Admitting: *Deleted

## 2016-04-30 DIAGNOSIS — K920 Hematemesis: Secondary | ICD-10-CM

## 2016-04-30 DIAGNOSIS — I780 Hereditary hemorrhagic telangiectasia: Secondary | ICD-10-CM

## 2016-04-30 DIAGNOSIS — K7460 Unspecified cirrhosis of liver: Secondary | ICD-10-CM

## 2016-04-30 LAB — COMPREHENSIVE METABOLIC PANEL
ALK PHOS: 137 U/L — AB (ref 38–126)
ALT: 29 U/L (ref 17–63)
ANION GAP: 8 (ref 5–15)
AST: 40 U/L (ref 15–41)
Albumin: 2.8 g/dL — ABNORMAL LOW (ref 3.5–5.0)
BILIRUBIN TOTAL: 8.1 mg/dL — AB (ref 0.3–1.2)
BUN: 45 mg/dL — ABNORMAL HIGH (ref 6–20)
CALCIUM: 9 mg/dL (ref 8.9–10.3)
CO2: 23 mmol/L (ref 22–32)
CREATININE: 2.38 mg/dL — AB (ref 0.61–1.24)
Chloride: 101 mmol/L (ref 101–111)
GFR, EST AFRICAN AMERICAN: 28 mL/min — AB (ref >60)
GFR, EST NON AFRICAN AMERICAN: 25 mL/min — AB (ref >60)
Glucose, Bld: 166 mg/dL — ABNORMAL HIGH (ref 65–99)
Potassium: 4 mmol/L (ref 3.5–5.1)
Sodium: 132 mmol/L — ABNORMAL LOW (ref 135–145)
TOTAL PROTEIN: 5.4 g/dL — AB (ref 6.5–8.1)

## 2016-04-30 LAB — MAGNESIUM: MAGNESIUM: 2.6 mg/dL — AB (ref 1.7–2.4)

## 2016-04-30 LAB — TSH: TSH: 5.792 u[IU]/mL — ABNORMAL HIGH (ref 0.350–4.500)

## 2016-04-30 LAB — CBC
HCT: 29.6 % — ABNORMAL LOW (ref 39.0–52.0)
HEMOGLOBIN: 9.1 g/dL — AB (ref 13.0–17.0)
MCH: 26.5 pg (ref 26.0–34.0)
MCHC: 30.7 g/dL (ref 30.0–36.0)
MCV: 86.3 fL (ref 78.0–100.0)
PLATELETS: 54 10*3/uL — AB (ref 150–400)
RBC: 3.43 MIL/uL — AB (ref 4.22–5.81)
RDW: 19.1 % — ABNORMAL HIGH (ref 11.5–15.5)
WBC: 4.8 10*3/uL (ref 4.0–10.5)

## 2016-04-30 LAB — PHOSPHORUS: Phosphorus: 4.7 mg/dL — ABNORMAL HIGH (ref 2.5–4.6)

## 2016-04-30 LAB — AMMONIA: Ammonia: 60 umol/L — ABNORMAL HIGH (ref 9–35)

## 2016-04-30 LAB — DIGOXIN LEVEL: DIGOXIN LVL: 0.4 ng/mL — AB (ref 0.8–2.0)

## 2016-04-30 LAB — PREPARE RBC (CROSSMATCH)

## 2016-04-30 MED ORDER — SODIUM CHLORIDE 0.9 % IV SOLN
50.0000 ug/h | INTRAVENOUS | Status: DC
  Administered 2016-04-30 – 2016-05-01 (×2): 50 ug/h via INTRAVENOUS
  Filled 2016-04-30 (×5): qty 1

## 2016-04-30 MED ORDER — OXYMETAZOLINE HCL 0.05 % NA SOLN
1.0000 | Freq: Two times a day (BID) | NASAL | Status: DC
  Administered 2016-04-30: 1 via NASAL
  Filled 2016-04-30: qty 15

## 2016-04-30 MED ORDER — SPIRONOLACTONE 25 MG PO TABS
50.0000 mg | ORAL_TABLET | Freq: Every day | ORAL | Status: DC
  Administered 2016-04-30: 50 mg via ORAL
  Filled 2016-04-30: qty 2

## 2016-04-30 MED ORDER — SALINE SPRAY 0.65 % NA SOLN
1.0000 | Freq: Two times a day (BID) | NASAL | Status: DC | PRN
  Administered 2016-04-30: 1 via NASAL
  Filled 2016-04-30: qty 44

## 2016-04-30 MED ORDER — SODIUM CHLORIDE 0.9 % IV SOLN
INTRAVENOUS | Status: DC
  Administered 2016-04-30: 19:00:00 via INTRAVENOUS

## 2016-04-30 MED ORDER — ENSURE ENLIVE PO LIQD
237.0000 mL | Freq: Two times a day (BID) | ORAL | Status: DC
  Administered 2016-04-30: 237 mL via ORAL

## 2016-04-30 MED ORDER — PANTOPRAZOLE SODIUM 40 MG IV SOLR
40.0000 mg | Freq: Two times a day (BID) | INTRAVENOUS | Status: DC
  Administered 2016-04-30 – 2016-05-01 (×2): 40 mg via INTRAVENOUS
  Filled 2016-04-30 (×2): qty 40

## 2016-04-30 MED ORDER — OXYMETAZOLINE HCL 0.05 % NA SOLN
2.0000 | Freq: Two times a day (BID) | NASAL | Status: DC
  Administered 2016-04-30 – 2016-05-01 (×3): 2 via NASAL
  Filled 2016-04-30: qty 15

## 2016-04-30 MED ORDER — SILVER NITRATE-POT NITRATE 75-25 % EX MISC
1.0000 | Freq: Once | CUTANEOUS | Status: AC
  Administered 2016-04-30: 1 via TOPICAL
  Filled 2016-04-30: qty 1

## 2016-04-30 MED ORDER — SALINE SPRAY 0.65 % NA SOLN
2.0000 | NASAL | Status: DC
  Administered 2016-04-30 – 2016-05-01 (×6): 2 via NASAL
  Filled 2016-04-30: qty 44

## 2016-04-30 MED ORDER — DEXTROSE 5 % IV SOLN
1.0000 g | INTRAVENOUS | Status: DC
  Administered 2016-04-30: 1 g via INTRAVENOUS
  Filled 2016-04-30 (×2): qty 10

## 2016-04-30 MED ORDER — MUPIROCIN 2 % EX OINT
TOPICAL_OINTMENT | Freq: Two times a day (BID) | CUTANEOUS | Status: DC
  Administered 2016-04-30 – 2016-05-01 (×3): via NASAL
  Filled 2016-04-30 (×2): qty 22

## 2016-04-30 MED ORDER — SPIRONOLACTONE 25 MG PO TABS
25.0000 mg | ORAL_TABLET | Freq: Every day | ORAL | Status: DC
  Administered 2016-04-30: 25 mg via ORAL
  Filled 2016-04-30: qty 1

## 2016-04-30 MED ORDER — MAGNESIUM OXIDE 400 (241.3 MG) MG PO TABS
200.0000 mg | ORAL_TABLET | Freq: Every day | ORAL | Status: DC
  Administered 2016-04-30 – 2016-05-01 (×2): 200 mg via ORAL
  Filled 2016-04-30 (×2): qty 1

## 2016-04-30 MED ORDER — OCTREOTIDE LOAD VIA INFUSION
50.0000 ug | Freq: Once | INTRAVENOUS | Status: AC
  Administered 2016-04-30: 50 ug via INTRAVENOUS
  Filled 2016-04-30: qty 25

## 2016-04-30 NOTE — Progress Notes (Addendum)
PROGRESS NOTE  Hunter [name] [name] DOB: Apr 28, 1935 DOA: 04/29/2016 PCP: Penni Homans, MD   LOS: 1 day   Brief Narrative: 81 y.o. male with medical history significant of Hepatic cirrhosis with history of hereditary hemorrhagic telangiectasia with chronic anemia due to chronic blood loss, Chronic diastolic heart failure, sp pacemaker, recurrent hepatic encephalopathy chronic kidney disease recurrent epistaxis, presented to the emergency room on 5/20 with unrelenting nosebleed resulting in vomiting blood.  Assessment & Plan: Active Problems:   Hepatic AV fistula (HCC)   HHT (hereditary hemorrhagic telangiectasia) (HCC)   Anemia due to chronic blood loss   Cardiac cirrhosis   Chronic kidney disease   Cirrhosis ([place])   Chronic diastolic heart failure (HCC)   Hepatic encephalopathy (HCC)   S/P placement of cardiac pacemaker   Addendum 6 pm Called bedside by RN because patient is vomiting bright red blood. On bedside evaluation it does not have the same characteristics as this morning (patient felt blood trickling down his throat before hematemesis), but instead felt all of a sudden nauseous and started the hematemesis. Nares without active bleeding. With his history of known angiodysplastic lesions in the stomach he might actually have a GI Bleed in addition to his epistaxis. Called and discussed case with Dr. Fuller Plan who will see patient in consultation. Plan for EGD tomorrow unless patient becomes hemodynamically unstable and in which case he might need an emergent EGD. Currently his heart rate is 80 and BP 107/73. I will move pt to SDU for closer monitoring. Start protonix IV. No known EVs. Start Ceftriaxone and Octreotide. NPO.  Hepatic encephalopathy - In the setting of liver cirrhosis - Ammonia elevated at 83 on admission, currently on lactulose 30 g every 6 hours, monitor mental status - Continue rifaximin as well  Acute on chronic recurrent epistaxis - Patient's  epistaxis has recently gotten worse in the last day. This morning on my evaluation he had a postnasal drip with bright red blood, which she swallowed several times and then had the bloody emesis which I saw and the emesis bag - Consulted ENT, discussed with Dr. Redmond Baseman who evaluated patient at bedside, appreciate input.  - His epistaxis has resolved  Anemia due to chronic blood loss - Related to hereditary hemorrhagic telangiectasia, chronic epistaxis - Hemoglobin 7.2 on admission, ordered 3 units packed red blood cells on admission - Continue iron supplements   Liver cirrhosis - Appears decompensated with encephalopathy, elevated bilirubin, thrombocytopenia  Chronic diastolic CHF - Seems clinically compensated, closely monitored while getting blood products - Continue digoxin, spironolactone, Demadex  Hyponatremia - Mild, related to liver disease as well as heart failure   Pancytopenia - Secondary to cirrhosis. Follow CBCs closely.  Stage IV chronic kidney disease - Creatinine appears to be at baseline.  Hereditary hemorrhagic telangiectasia (HHT)   DVT prophylaxis: SCDs Code Status: DNR Family Communication: Discussed with wife at bedside  Disposition Plan: Home when ready   Consultants:   ENT   Procedures:   None   Antimicrobials:  None   Subjective: - Patient appears to be uncomfortable, however seems alert to place and situation. Very mild confusion still present.   Objective: Filed Vitals:   04/30/16 0650 04/30/16 0814 04/30/16 0829 04/30/16 1110  BP: 104/63 115/58 106/49 92/37  Pulse: 78 78 82 82  Temp: 98.1 F (36.7 C) 98 F (36.7 C) 98 F (36.7 C) 98 F (36.7 C)  TempSrc: Oral Oral Oral Oral  Resp: 20 20 20 20   Height:  Weight:      SpO2: 98% 98% 98% 98%    Intake/Output Summary (Last 24 hours) at 04/30/16 1146 Last data filed at 04/30/16 1110  Gross per 24 hour  Intake   1621 ml  Output    678 ml  Net    943 ml   Filed Weights    04/29/16 2331  Weight: 74.934 kg (165 lb 3.2 oz)    Examination: Constitutional: Week appearing male  Filed Vitals:   04/30/16 0650 04/30/16 0814 04/30/16 0829 04/30/16 1110  BP: 104/63 115/58 106/49 92/37  Pulse: 78 78 82 82  Temp: 98.1 F (36.7 C) 98 F (36.7 C) 98 F (36.7 C) 98 F (36.7 C)  TempSrc: Oral Oral Oral Oral  Resp: 20 20 20 20   Height:      Weight:      SpO2: 98% 98% 98% 98%   Eyes: PERRL, lids and conjunctivae normal, mild scleral icterus  ENMT: red raw mucosa and bilateral nostrils  Respiratory: clear to auscultation bilaterally, no wheezing, no crackles. Normal respiratory effort. No accessory muscle use.  Cardiovascular: Regular rate and rhythm, no murmurs / rubs / gallops. Trace LE edema. 2+ pedal pulses.  Abdomen: no tenderness. Bowel sounds positive.  Musculoskeletal: no clubbing / cyanosis. .  Neurologic:  nonfocal.    Data Reviewed: I have personally reviewed following labs and imaging studies  CBC:  Recent Labs Lab 04/29/16 1857  WBC 3.9*  NEUTROABS 2.8  HGB 7.2*  HCT 23.9*  MCV 89.5  PLT 61*   Basic Metabolic Panel:  Recent Labs Lab 04/29/16 1857  NA 134*  K 4.4  CL 100*  CO2 25  GLUCOSE 127*  BUN 46*  CREATININE 2.43*  CALCIUM 8.9   GFR: Estimated Creatinine Clearance: 26.5 mL/min (by C-G formula based on Cr of 2.43). Liver Function Tests:  Recent Labs Lab 04/29/16 1857  AST 36  ALT 26  ALKPHOS 137*  BILITOT 2.3*  PROT 5.1*  ALBUMIN 2.7*   No results for input(s): LIPASE, AMYLASE in the last 168 hours.  Recent Labs Lab 04/29/16 1857  AMMONIA 83*   Coagulation Profile:  Recent Labs Lab 04/29/16 1857  INR 1.34   Cardiac Enzymes: No results for input(s): CKTOTAL, CKMB, CKMBINDEX, TROPONINI in the last 168 hours. BNP (last 3 results)  Recent Labs  08/09/15 1621  PROBNP 188.0*   HbA1C: No results for input(s): HGBA1C in the last 72 hours. CBG: No results for input(s): GLUCAP in the last 168  hours. Lipid Profile: No results for input(s): CHOL, HDL, LDLCALC, TRIG, CHOLHDL, LDLDIRECT in the last 72 hours. Thyroid Function Tests: No results for input(s): TSH, T4TOTAL, FREET4, T3FREE, THYROIDAB in the last 72 hours. Anemia Panel: No results for input(s): VITAMINB12, FOLATE, FERRITIN, TIBC, IRON, RETICCTPCT in the last 72 hours. Urine analysis:    Component Value Date/Time   COLORURINE YELLOW 04/29/2016 2107   APPEARANCEUR CLEAR 04/29/2016 2107   LABSPEC 1.011 04/29/2016 2107   PHURINE 7.0 04/29/2016 2107   GLUCOSEU NEGATIVE 04/29/2016 2107   HGBUR NEGATIVE 04/29/2016 2107   BILIRUBINUR NEGATIVE 04/29/2016 2107   KETONESUR NEGATIVE 04/29/2016 2107   PROTEINUR NEGATIVE 04/29/2016 2107   UROBILINOGEN 0.2 08/10/2015 1700   NITRITE NEGATIVE 04/29/2016 2107   LEUKOCYTESUR NEGATIVE 04/29/2016 2107   Sepsis Labs: Invalid input(s): PROCALCITONIN, LACTICIDVEN  No results found for this or any previous visit (from the past 240 hour(s)).    Radiology Studies: Ct Head Wo Contrast  04/29/2016  CLINICAL DATA:  Acute onset of altered mental status. Generalized weakness, unsteady gait and aphasia. Initial encounter. EXAM: CT HEAD WITHOUT CONTRAST TECHNIQUE: Contiguous axial images were obtained from the base of the skull through the vertex without intravenous contrast. COMPARISON:  None. FINDINGS: There is no evidence of acute infarction, mass lesion, or intra- or extra-axial hemorrhage on CT. Prominence of the ventricles and sulci reflects moderate cortical volume loss. Cerebellar atrophy is noted. Scattered periventricular white matter change likely reflects small vessel ischemic microangiopathy. The brainstem and fourth ventricle are within normal limits. The basal ganglia are unremarkable in appearance. The cerebral hemispheres demonstrate grossly normal gray-white differentiation. No mass effect or midline shift is seen. There is no evidence of fracture; visualized osseous structures are  unremarkable in appearance. The orbits are within normal limits. There is partial opacification of the right maxillary sinus. The remaining paranasal sinuses and mastoid air cells are well-aerated. No significant soft tissue abnormalities are seen. IMPRESSION: 1. No acute intracranial pathology seen on CT. 2. Moderate cortical volume loss and scattered small vessel ischemic microangiopathy. 3. Partial opacification of the right maxillary sinus. Electronically Signed   By: Garald Balding M.D.   On: 04/29/2016 19:59     Scheduled Meds: . sodium chloride  10 mL/hr Intravenous Once  . [START ON 05/01/2016] digoxin  0.125 mg Oral Once per day on Mon Wed Fri  . ferrous sulfate  325 mg Oral BID WC  . lactulose  30 g Oral Q6H  . magnesium oxide  200 mg Oral Daily  . mupirocin ointment   Nasal BID  . oxymetazoline  2 spray Each Nare BID  . pantoprazole  40 mg Oral Daily  . rifaximin  550 mg Oral BID  . sodium chloride  2 spray Each Nare Q2H while awake  . sodium chloride flush  3 mL Intravenous Q12H  . spironolactone  25 mg Oral QHS  . spironolactone  50 mg Oral Daily  . torsemide  80 mg Oral BID   Continuous Infusions:     Marzetta Board, MD, PhD [facility] Pager [phone]  If 7PM-7AM, please contact night-coverage [email or web address] Password [facility] 04/30/2016, 11:46 AM

## 2016-04-30 NOTE — Consult Note (Signed)
Reason for Consult:Epistaxis Referring Physician: Hospitalists  Hunter [name] is an 81 y.o. male.  HPI: 81 year old male with HHT having required countless nasal cautery procedures in [facility] over the years, moved to [place] last summer due to worsening medical problems.  Admitted to hospital last evening due to hematemesis and nosebleed and due to anemia.  He did not bleed last night but had further hematemesis and bleeding this morning.  Not bleeding currently.  Receiving blood transfusion.  Past Medical History  Diagnosis Date  . CHF (congestive heart failure) ([place])   . Pacemaker     single lead Med[employer]onic pacemaker  . HHT (hereditary hemorrhagic telangiectasia) ([place])   . Anemia   . Pulmonary hypertension ([place])   . 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 Renal insufficiency   . Liver failure ([place])   . Atrial fibrillation ([place])   . Hepatic AV fistula ([place]) 08/09/2015  . 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 Anemia due to chronic blood loss 08/09/2015  . Right heart failure ([place]) 08/09/2015  . Tricuspid regurgitation 08/09/2015  . 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 Melanoma ([place])     2 on nose, 1 on back, 1 on leg    Past Surgical History  Procedure Laterality Date  . Pacemaker insertion    . Colon surgery  1196  . Esophagogastroduodenoscopy N/A 08/13/2015    Procedure: ESOPHAGOGASTRODUODENOSCOPY (EGD);  Surgeon: Ladene Artist, MD;  Location: [facility] ENDOSCOPY;  Service: Endoscopy;  Laterality: N/A;  . Tonsillectomy Bilateral     Family History  Problem Relation Age of Onset  . Other Mother     HHT  . Other Son     HHT  . Heart failure Father   . Heart disease Sister     afib, pacer  . Arthritis Brother     b/l TKR  . Other Brother   . Heart disease Sister     pacer    Social History:  reports that he has never smoked. He has never used smokeless tobacco. He reports that he does not drink alcohol or use illicit drugs.  Allergies: No Known Allergies  Medications: I have  reviewed the patient's current medications.  Results for orders placed or performed during the hospital encounter of 04/29/16 (from the past 48 hour(s))  CBC with Differential     Status: Abnormal   Collection Time: 04/29/16  6:57 PM  Result Value Ref Range   WBC 3.9 (L) 4.0 - 10.5 K/uL   RBC 2.67 (L) 4.22 - 5.81 MIL/uL   Hemoglobin 7.2 (L) 13.0 - 17.0 g/dL   HCT 23.9 (L) 39.0 - 52.0 %   MCV 89.5 78.0 - 100.0 fL   MCH 27.0 26.0 - 34.0 pg   MCHC 30.1 30.0 - 36.0 g/dL   RDW 18.2 (H) 11.5 - 15.5 %   Platelets 61 (L) 150 - 400 K/uL    Comment: REPEATED TO VERIFY PLATELET COUNT CONFIRMED BY SMEAR    Neutrophils Relative % 71 %   Neutro Abs 2.8 1.7 - 7.7 K/uL   Lymphocytes Relative 6 %   Lymphs Abs 0.3 (L) 0.7 - 4.0 K/uL   Monocytes Relative 21 %   Monocytes Absolute 0.8 0.1 - 1.0 K/uL   Eosinophils Relative 1 %   Eosinophils Absolute 0.1 0.0 - 0.7 K/uL   Basophils Relative 1 %   Basophils Absolute 0.0 0.0 - 0.1 K/uL  Comprehensive metabolic panel     Status: Abnormal   Collection Time: 04/29/16  6:57  PM  Result Value Ref Range   Sodium 134 (L) 135 - 145 mmol/L   Potassium 4.4 3.5 - 5.1 mmol/L   Chloride 100 (L) 101 - 111 mmol/L   CO2 25 22 - 32 mmol/L   Glucose, Bld 127 (H) 65 - 99 mg/dL   BUN 46 (H) 6 - 20 mg/dL   Creatinine, Ser 2.43 (H) 0.61 - 1.24 mg/dL   Calcium 8.9 8.9 - 10.3 mg/dL   Total Protein 5.1 (L) 6.5 - 8.1 g/dL   Albumin 2.7 (L) 3.5 - 5.0 g/dL   AST 36 15 - 41 U/L   ALT 26 17 - 63 U/L   Alkaline Phosphatase 137 (H) 38 - 126 U/L   Total Bilirubin 2.3 (H) 0.3 - 1.2 mg/dL   GFR calc non Af Amer 24 (L) >60 mL/min   GFR calc Af Amer 28 (L) >60 mL/min    Comment: (NOTE) The eGFR has been calculated using the CKD EPI equation. This calculation has not been validated in all clinical situations. eGFR's persistently <60 mL/min signify possible Chronic Kidney Disease.    Anion gap 9 5 - 15  Protime-INR     Status: Abnormal   Collection Time: 04/29/16  6:57 PM   Result Value Ref Range   Prothrombin Time 16.7 (H) 11.6 - 15.2 seconds   INR 1.34 0.00 - 1.49  Type and screen     Status: None (Preliminary result)   Collection Time: 04/29/16  6:57 PM  Result Value Ref Range   ABO/RH(D) A POS    Antibody Screen NEG    Sample Expiration 05/02/2016    Unit Number [id number]    Blood Component Type RED CELLS,LR    Unit division 00    Status of Unit ISSUED    Transfusion Status OK TO TRANSFUSE    Crossmatch Result Compatible    Unit Number [id number]    Blood Component Type RED CELLS,LR    Unit division 00    Status of Unit ISSUED    Transfusion Status OK TO TRANSFUSE    Crossmatch Result Compatible    Unit Number [id number]    Blood Component Type RBC LR PHER2    Unit division 00    Status of Unit ISSUED    Transfusion Status OK TO TRANSFUSE    Crossmatch Result Compatible   Ammonia     Status: Abnormal   Collection Time: 04/29/16  6:57 PM  Result Value Ref Range   Ammonia 83 (H) 9 - 35 umol/L  Prepare RBC     Status: None   Collection Time: 04/29/16  8:22 PM  Result Value Ref Range   Order Confirmation ORDER PROCESSED BY BLOOD BANK   Urinalysis, Routine w reflex microscopic (not at [facility])     Status: None   Collection Time: 04/29/16  9:07 PM  Result Value Ref Range   Color, Urine YELLOW YELLOW   APPearance CLEAR CLEAR   Specific Gravity, Urine 1.011 1.005 - 1.030   pH 7.0 5.0 - 8.0   Glucose, UA NEGATIVE NEGATIVE mg/dL   Hgb urine dipstick NEGATIVE NEGATIVE   Bilirubin Urine NEGATIVE NEGATIVE   Ketones, ur NEGATIVE NEGATIVE mg/dL   Protein, ur NEGATIVE NEGATIVE mg/dL   Nitrite NEGATIVE NEGATIVE   Leukocytes, UA NEGATIVE NEGATIVE    Comment: MICROSCOPIC NOT DONE ON URINES WITH NEGATIVE PROTEIN, BLOOD, LEUKOCYTES, NITRITE, OR GLUCOSE <1000 mg/dL.  Digoxin level     Status: Abnormal   Collection Time: 04/30/16 12:07 AM  Result Value Ref Range   Digoxin Level 0.4 (L) 0.8 - 2.0 ng/mL  Prepare RBC     Status: None    Collection Time: 04/30/16  3:12 AM  Result Value Ref Range   Order Confirmation ORDER PROCESSED BY BLOOD BANK     Ct Head Wo Contrast  04/29/2016  CLINICAL DATA:  Acute onset of altered mental status. Generalized weakness, unsteady gait and aphasia. Initial encounter. EXAM: CT HEAD WITHOUT CONTRAST TECHNIQUE: Contiguous axial images were obtained from the base of the skull through the vertex without intravenous contrast. COMPARISON:  None. FINDINGS: There is no evidence of acute infarction, mass lesion, or intra- or extra-axial hemorrhage on CT. Prominence of the ventricles and sulci reflects moderate cortical volume loss. Cerebellar atrophy is noted. Scattered periventricular white matter change likely reflects small vessel ischemic microangiopathy. The brainstem and fourth ventricle are within normal limits. The basal ganglia are unremarkable in appearance. The cerebral hemispheres demonstrate grossly normal gray-white differentiation. No mass effect or midline shift is seen. There is no evidence of fracture; visualized osseous structures are unremarkable in appearance. The orbits are within normal limits. There is partial opacification of the right maxillary sinus. The remaining paranasal sinuses and mastoid air cells are well-aerated. No significant soft tissue abnormalities are seen. IMPRESSION: 1. No acute intracranial pathology seen on CT. 2. Moderate cortical volume loss and scattered small vessel ischemic microangiopathy. 3. Partial opacification of the right maxillary sinus. Electronically Signed   By: Garald Balding M.D.   On: 04/29/2016 19:59    Review of Systems  HENT: Positive for nosebleeds.   Gastrointestinal: Positive for vomiting.  Neurological: Positive for weakness.  All other systems reviewed and are negative.  Blood pressure 106/49, pulse 82, temperature 98 F (36.7 C), temperature source Oral, resp. rate 20, height 6' (1.829 m), weight 74.934 kg (165 lb 3.2 oz), SpO2 98  %. Physical Exam  Constitutional: He is oriented to person, place, and time. He appears well-developed and well-nourished. No distress.  HENT:  Head: Normocephalic and atraumatic.  Right Ear: External ear normal.  Left Ear: External ear normal.  Mouth/Throat: Oropharynx is clear and moist.  Bilateral ITC aids.  Scattered telangiectasias over facial skin and elsewhere.  No active nose-bleeding.  No bleeding in oropharynx.  Removed clot from right nasal passage, no active bleeding.  Eyes: Conjunctivae and EOM are normal. Pupils are equal, round, and reactive to light.  Neck: Normal range of motion. Neck supple.  Cardiovascular: Normal rate.   Respiratory: Effort normal.  Musculoskeletal: Normal range of motion.  Neurological: He is alert and oriented to person, place, and time. No cranial nerve deficit.  Skin: Skin is warm and dry.  Psychiatric: He has a normal mood and affect. His behavior is normal. Judgment and thought content normal.    Assessment/Plan: HHT, epistaxis, anemia The right nasal passage was cleared of clot and there is no active bleeding at present.  In discussing options with his wife, we agreed to try a conservative approach before performing further cautery, if possible.  I will have him spray each nostril with Afrin twice daily for three days, saline spray in each nostril several times per day indefinitely, and apply Mupirocin ointment in each nostril twice daily for two weeks.  If he does not bleed badly further while inpatient, he can follow-up in 1-2 weeks as an outpatient.  If bad bleeding returns, please call me back.  Karolee Meloni 04/30/2016, 10:27 AM

## 2016-04-30 NOTE — Progress Notes (Signed)
Utilization review completed.  

## 2016-04-30 NOTE — Evaluation (Signed)
Physical Therapy Evaluation Patient Details Name: Monford Nasca MRN: [id number] DOB: 1935-01-18 Today's Date: 04/30/2016   History of Present Illness  Patient is a 81 yo male admitted 04/29/16 with severe nosebleed, hematemesis, anemia.   PMH:  HHT, CHF, pacemaker, anemia, renal insufficiency, liver cirrhosis, Afib, recurrent hepatic encephalopathy, CKD  Clinical Impression  Patient presents with problems listed below.  Will benefit from acute PT to maximize functional mobility prior to return home with wife.  Recommend patient continue HHPT services in place pta.    Follow Up Recommendations Home health PT;Supervision for mobility/OOB    Equipment Recommendations  None recommended by PT    Recommendations for Other Services       Precautions / Restrictions Precautions Precautions: Fall Restrictions Weight Bearing Restrictions: No      Mobility  Bed Mobility Overal bed mobility: Modified Independent             General bed mobility comments: Increased time  Transfers Overall transfer level: Needs assistance Equipment used: Rolling walker (2 wheeled) Transfers: Sit to/from Stand Sit to Stand: Supervision         General transfer comment: Patient uses correct hand placement and safe technique.  Supervision for safety.  Ambulation/Gait Ambulation/Gait assistance: Min guard Ambulation Distance (Feet): 160 Feet Assistive device: Rolling walker (2 wheeled) Gait Pattern/deviations: Step-through pattern;Decreased stride length;Trunk flexed     General Gait Details: Verbal cues to stay close to RW.  Patient tends to move quickly with RW too far ahead of himself.  Cues for safety.  Stairs            Wheelchair Mobility    Modified Rankin (Stroke Patients Only)       Balance Overall balance assessment: Needs assistance         Standing balance support: Single extremity supported;No upper extremity supported Standing balance-Leahy Scale:  Fair Standing balance comment: Static balance during stance.  Requires UE support for dynamic activities/gait.                             Pertinent Vitals/Pain Pain Assessment: No/denies pain    Home Living Family/patient expects to be discharged to:: Private residence Living Arrangements: Spouse/significant other Available Help at Discharge: Family;Available 24 hours/day Type of Home: House Home Access: Stairs to enter Entrance Stairs-Rails: None Entrance Stairs-Number of Steps: 2 Home Layout: Two level;Able to live on main level with bedroom/bathroom Home Equipment: Gilford Rile - 2 wheels;Bedside commode;Shower seat      Prior Function Level of Independence: Independent with assistive device(s);Needs assistance   Gait / Transfers Assistance Needed: ambulates with RW with supervision.  Walks in neighborhood with RW and wife.  ADL's / Homemaking Assistance Needed: Wife assists with bathing, meal prep, housekeeping, med management        Hand Dominance   Dominant Hand: Right    Extremity/Trunk Assessment   Upper Extremity Assessment: Generalized weakness           Lower Extremity Assessment: Generalized weakness      Cervical / Trunk Assessment: Kyphotic  Communication   Communication: HOH (wears bil hearing aids)  Cognition Arousal/Alertness: Awake/alert Behavior During Therapy: WFL for tasks assessed/performed;Flat affect Overall Cognitive Status: Within Functional Limits for tasks assessed                      General Comments      Exercises        Assessment/Plan  PT Assessment Patient needs continued PT services  PT Diagnosis Difficulty walking;Generalized weakness   PT Problem List Decreased strength;Decreased balance;Decreased mobility;Decreased knowledge of use of DME  PT Treatment Interventions DME instruction;Gait training;Stair training;Functional mobility training;Therapeutic activities;Therapeutic exercise;Patient/family  education   PT Goals (Current goals can be found in the Care Plan section) Acute Rehab PT Goals Patient Stated Goal: To go home soon PT Goal Formulation: With patient/family Time For Goal Achievement: 05/07/16 Potential to Achieve Goals: Good    Frequency Min 3X/week   Barriers to discharge        Co-evaluation               End of Session Equipment Utilized During Treatment: Gait belt Activity Tolerance: Patient tolerated treatment well Patient left: in bed;with call bell/phone within reach;with family/visitor present Nurse Communication: Mobility status         Time: [id number] PT Time Calculation (min) (ACUTE ONLY): 19 min   Charges:   PT Evaluation $PT Eval Moderate Complexity: 1 Procedure     PT G CodesDespina Pole 05-24-2016, 5:25 PM Carita Pian. Sanjuana Kava, [place] Pager [phone]

## 2016-04-30 NOTE — Consult Note (Signed)
Consult Note   Referring Provider: Laverna Peace, MD Primary Care Physician:  Penni Homans, MD Primary Gastroenterologist:  Dr. Silvano Rusk  Reason for Consultation:  Hematemesis  HPI: Hunter [name] is a 81 y.o. male with medical history significant forcardiac cirrhosis, hereditary hemorrhagic telangiectasia, hepatic AVM, chronic iron deficiency anemia due to chronic blood loss,almost daily epistaxis, chronic diastolic heart failure,pacemaker, recurrent hepatic encephalopathy and chronic kidney disease who presented to the ED on 5/20 with unrelenting nosebleed, lethargy and vomiting blood. Wife at bedside who provided most of the history as pt is lethargic. ENT evaluated and found a clot in the right nasal passage without active bleeding. Heavy epistaxis yesterday and today with 3 episodes of hematemesis with clots and BRB.   EGD IMPRESSION 08/13/2015 performed for iron deficiency and heme + stool: 1. Bleeding angiodysplastic lesions in the gastric fundus and cardia; APC applied with complete hemostasis achieved 2. Angiodysplastic lesions in the gastric fundus and gastric body; APC applied with good treatment effect 3. Small hiatal hernia 4. The EGD otherwise appeared normal   Past Medical History  Diagnosis Date  . CHF (congestive heart failure) ([place])   . Pacemaker     single lead Medtronic pacemaker  . HHT (hereditary hemorrhagic telangiectasia) ([place])   . Anemia   . Pulmonary hypertension ([place])   . 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 Renal insufficiency   . Liver failure ([place])   . Atrial fibrillation ([place])   . Hepatic AV fistula ([place]) 08/09/2015  . 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 Anemia due to chronic blood loss 08/09/2015  . Right heart failure ([place]) 08/09/2015  . Tricuspid regurgitation 08/09/2015  . 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 Melanoma ([place])     2 on nose, 1 on back, 1 on leg    Past Surgical History  Procedure Laterality Date  . Pacemaker insertion    .  Colon surgery  1196  . Esophagogastroduodenoscopy N/A 08/13/2015    Procedure: ESOPHAGOGASTRODUODENOSCOPY (EGD);  Surgeon: Ladene Artist, MD;  Location: [facility] ENDOSCOPY;  Service: Endoscopy;  Laterality: N/A;  . Tonsillectomy Bilateral     Prior to Admission medications   Medication Sig Start Date End Date Taking? Authorizing Provider  b complex-vitamin c-folic acid (NEPHRO-VITE) 0.8 MG TABS tablet Take 1 tablet by mouth daily. 10/18/15  Yes Mosie Lukes, MD  cyanocobalamin (,VITAMIN B-12,) 1000 MCG/ML injection Inject 1,000 mcg into the muscle every 30 (thirty) days. 1st of every month   Yes Historical Provider, MD  digoxin (LANOXIN) 0.125 MG tablet Take 1 tablet (0.125 mg total) by mouth 3 (three) times a week. Jory Sims, Fri 09/02/15  Yes Shaune Pascal Bensimhon, MD  feeding supplement, ENSURE ENLIVE, (ENSURE ENLIVE) LIQD Take 237 mLs by mouth 2 (two) times daily between meals. 08/15/15  Yes Olam Idler, MD  ferrous sulfate 325 (65 FE) MG EC tablet Take 325 mg by mouth 2 (two) times daily with a meal.    Yes Historical Provider, MD  KLOR-CON M20 20 MEQ tablet TAKE TWO TABLETS BY MOUTH IN THE MORNING AND ONE TABLET IN THE EVENING 03/29/16  Yes Shaune Pascal Bensimhon, MD  lactulose (CHRONULAC) 10 GM/15ML solution Take 45 mLs (30 g total) by mouth 3 (three) times daily. Titrate to 3-4 BM's day 04/23/16  Yes Ripudeep Krystal Eaton, MD  Magnesium 250 MG TABS Take 250 mg by mouth daily.    Yes Historical Provider, MD  metolazone (ZAROXOLYN) 2.5 MG tablet Take 1 tablet (2.5 mg total) by mouth  as needed (for weight of 171 lb or greater). 03/27/16  Yes Shaune Pascal Bensimhon, MD  ondansetron (ZOFRAN) 4 MG tablet Take 1 tablet (4 mg total) by mouth every 8 (eight) hours as needed for nausea or vomiting. 09/20/15  Yes Gatha Mayer, MD  pantoprazole (PROTONIX) 40 MG tablet TAKE ONE TABLET BY MOUTH ONCE DAILY 04/20/16  Yes Jolaine Artist, MD  promethazine (PHENERGAN) 12.5 MG tablet Take 0.5 tablets (6.25 mg total) by mouth every  8 (eight) hours as needed for nausea or vomiting. Patient taking differently: Take 6.25 mg by mouth daily as needed for nausea or vomiting.  04/23/16  Yes Ripudeep K Rai, MD  saline (AYR) GEL Place 1 application into the nose 2 (two) times daily as needed (for nose bleeds).   Yes Historical Provider, MD  spironolactone (ALDACTONE) 50 MG tablet Take 50 mg (1 tab) in am and 25 mg (1/2 tab) in pm. 03/20/16  Yes Jolaine Artist, MD  SYRINGE-NEEDLE, DISP, 3 ML 25G X 5/8" 3 ML MISC To use with B12 injections 03/09/16  Yes Mosie Lukes, MD  torsemide (DEMADEX) 20 MG tablet Take 4 tablets (80 mg total) by mouth 2 (two) times daily. 03/20/16  Yes Jolaine Artist, MD  XIFAXAN 550 MG TABS tablet TAKE ONE TABLET BY MOUTH TWICE DAILY 10/28/15  Yes Gatha Mayer, MD    Current Facility-Administered Medications  Medication Dose Route Frequency Provider Last Rate Last Dose  . 0.9 %  sodium chloride infusion  10 mL/hr Intravenous Once Toy Baker, MD      . 0.9 %  sodium chloride infusion   Intravenous Continuous Costin Karlyne Greenspan, MD      . acetaminophen (TYLENOL) tablet 650 mg  650 mg Oral Q6H PRN Toy Baker, MD       Or  . acetaminophen (TYLENOL) suppository 650 mg  650 mg Rectal Q6H PRN Toy Baker, MD      . cefTRIAXone (ROCEPHIN) 1 g in dextrose 5 % 50 mL IVPB  1 g Intravenous Q24H Costin Karlyne Greenspan, MD      . Derrill Memo ON 05/01/2016] digoxin (LANOXIN) tablet 0.125 mg  0.125 mg Oral Once per day on Mon Wed Fri Toy Baker, MD      . feeding supplement (ENSURE ENLIVE) (ENSURE ENLIVE) liquid 237 mL  237 mL Oral BID BM Caren Griffins, MD   237 mL at 04/30/16 1709  . ferrous sulfate tablet 325 mg  325 mg Oral BID WC Toy Baker, MD   325 mg at 04/30/16 1709  . lactulose (CHRONULAC) 10 GM/15ML solution 30 g  30 g Oral Q6H Toy Baker, MD   30 g at 04/30/16 1709  . magnesium oxide (MAG-OX) tablet 200 mg  200 mg Oral Daily Toy Baker, MD   200 mg at 04/30/16  1008  . mupirocin ointment (BACTROBAN) 2 %   Nasal BID Melida Quitter, MD      . octreotide (SANDOSTATIN) 2 mcg/mL load via infusion 50 mcg  50 mcg Intravenous Once Costin Karlyne Greenspan, MD       And  . octreotide (SANDOSTATIN) 500 mcg in sodium chloride 0.9 % 250 mL (2 mcg/mL) infusion  50 mcg/hr Intravenous Continuous Costin Karlyne Greenspan, MD      . ondansetron (ZOFRAN) tablet 4 mg  4 mg Oral Q8H PRN Toy Baker, MD      . oxymetazoline (AFRIN) 0.05 % nasal spray 2 spray  2 spray Each Nare BID Melida Quitter, MD  2 spray at 04/30/16 1312  . pantoprazole (PROTONIX) injection 40 mg  40 mg Intravenous Q12H Costin Karlyne Greenspan, MD      . rifaximin Doreene Nest) tablet 550 mg  550 mg Oral BID Toy Baker, MD   550 mg at 04/30/16 1007  . sodium chloride (OCEAN) 0.65 % nasal spray 1 spray  1 spray Each Nare BID PRN Toy Baker, MD   1 spray at 04/30/16 1011  . sodium chloride (OCEAN) 0.65 % nasal spray 2 spray  2 spray Each Nare Q2H while awake Melida Quitter, MD   2 spray at 04/30/16 1710  . sodium chloride flush (NS) 0.9 % injection 3 mL  3 mL Intravenous Q12H Toy Baker, MD   3 mL at 04/30/16 1009    Allergies as of 04/29/2016  . (No Known Allergies)    Family History  Problem Relation Age of Onset  . Other Mother     HHT  . Other Son     HHT  . Heart failure Father   . Heart disease Sister     afib, pacer  . Arthritis Brother     b/l TKR  . Other Brother   . Heart disease Sister     pacer    Social History   Social History  . Marital Status: Married    Spouse Name: N/A  . Number of Children: 1  . Years of Education: N/A   Occupational History  . retired    Social History Main Topics  . Smoking status: Never Smoker   . Smokeless tobacco: Never Used  . Alcohol Use: No  . Drug Use: No  . 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 Not on  file   Social History Narrative   Married 1 son   Retired Therapist, art   One caffeinated beverage a day no alcohol   He was living in [employer] at the [place] [address] in [place] and has located to [place] recently.   08/09/2015       Review of Systems: Gen: Denies any fever, chills, sweats, anorexia, fatigue, weakness, malaise, weight loss, and sleep disorder CV: Denies chest pain, angina, palpitations, syncope, orthopnea, PND, peripheral edema, and claudication. Resp: Denies dyspnea at rest, dyspnea with exercise, cough, sputum, wheezing, coughing up blood, and pleurisy. GI: Denies vomiting blood, jaundice, and fecal incontinence.   Denies dysphagia or odynophagia. GU : Denies urinary burning, blood in urine, urinary frequency, urinary hesitancy, nocturnal urination, and urinary incontinence. MS: Denies joint pain, limitation of movement, and swelling, stiffness, low back pain, extremity pain. Denies muscle weakness, cramps, atrophy.  Derm: Denies rash, itching, dry skin, hives, moles, warts, or unhealing ulcers.  Psych: Denies depression, anxiety, memory loss, suicidal ideation, hallucinations, paranoia, and confusion. Heme: Denies bruising, bleeding, and enlarged lymph nodes. Neuro:  Denies any headaches, dizziness, paresthesias. Endo:  Denies any problems with DM, thyroid, adrenal function.  Physical Exam: Vital signs in last 24 hours: Temp:  [97.7 F (36.5 C)-98.4 F (36.9 C)] 98.4 F (36.9 C) (05/21 1249) Pulse Rate:  [78-89] 82 (05/21 1800) Resp:  [13-22] 20 (05/21 1249) BP: (92-119)/(37-63) 106/57 mmHg (05/21 1800) SpO2:  [98 %-100 %] 99 % (05/21 1800) Weight:  [165 lb 3.2 oz (74.934 kg)] 165 lb 3.2 oz (74.934 kg) (05/20 2331) Last BM Date: 04/30/16  General:  Well-developed, well-nourished, elderly, somnolent, lethargic Head:  Normocephalic and atraumatic. Eyes:  Sclera icterus. Conjunctiva pink. Ears:  Normal auditory  acuity. Nose:  No deformity, discharge, or lesions. Mouth:  No deformity or lesions. Oropharynx pink & moist. Neck:  Supple; no masses or thyromegaly. Chest:  Clear throughout to auscultation. No wheezes, crackles, or rhonchi. No acute distress. Heart:  Regular rate and rhythm; no murmurs, clicks, rubs, or gallops. Abdomen:  Soft, nontender and nondistended. No masses, hepatosplenomegaly or hernias noted. Normal bowel sounds, without guarding, and without rebound.   Rectal:  Deferred  Msk:  Symmetrical without gross deformities. Normal posture. Pulses:  Normal pulses noted. Extremities:  Without clubbing or edema. Neurologic:  Alert and oriented x 3; somnolent, lethargic, tremor vs asterixis. Skin:  Intact without significant lesions or rashes. Cervical Nodes:  No significant cervical adenopathy. Psych:  Alert and cooperative. Normal mood and affect.  Intake/Output from previous day: 05/20 0701 - 05/21 0700 In: 1220 [P.O.:480; Blood:740] Out: 677 [Urine:675; Stool:2] Intake/Output this shift: Total I/O In: 641 [P.O.:360; Blood:281] Out: 901 [Urine:901]  Lab Results:  Recent Labs  04/29/16 1857 04/30/16 1318  WBC 3.9* 4.8  HGB 7.2* 9.1*  HCT 23.9* 29.6*  PLT 61* 54*   BMET  Recent Labs  04/29/16 1857 04/30/16 1318  NA 134* 132*  K 4.4 4.0  CL 100* 101  CO2 25 23  GLUCOSE 127* 166*  BUN 46* 45*  CREATININE 2.43* 2.38*  CALCIUM 8.9 9.0   LFT  Recent Labs  04/30/16 1318  PROT 5.4*  ALBUMIN 2.8*  AST 40  ALT 29  ALKPHOS 137*  BILITOT 8.1*   PT/INR  Recent Labs  04/29/16 1857  LABPROT 16.7*  INR 1.34    Studies/Results: Ct Head Wo Contrast  04/29/2016  CLINICAL DATA:  Acute onset of altered mental status. Generalized weakness, unsteady gait and aphasia. Initial encounter. EXAM: CT HEAD WITHOUT CONTRAST TECHNIQUE: Contiguous axial images were obtained from the base of the skull through the vertex without intravenous contrast. COMPARISON:  None.  FINDINGS: There is no evidence of acute infarction, mass lesion, or intra- or extra-axial hemorrhage on CT. Prominence of the ventricles and sulci reflects moderate cortical volume loss. Cerebellar atrophy is noted. Scattered periventricular white matter change likely reflects small vessel ischemic microangiopathy. The brainstem and fourth ventricle are within normal limits. The basal ganglia are unremarkable in appearance. The cerebral hemispheres demonstrate grossly normal gray-white differentiation. No mass effect or midline shift is seen. There is no evidence of fracture; visualized osseous structures are unremarkable in appearance. The orbits are within normal limits. There is partial opacification of the right maxillary sinus. The remaining paranasal sinuses and mastoid air cells are well-aerated. No significant soft tissue abnormalities are seen. IMPRESSION: 1. No acute intracranial pathology seen on CT. 2. Moderate cortical volume loss and scattered small vessel ischemic microangiopathy. 3. Partial opacification of the right maxillary sinus. Electronically Signed   By: Garald Balding M.D.   On: 04/29/2016 19:59    Previous Endoscopies: See HPI  Impression:  1. Hematemesis. Epistaxis with swallowed blood vs UGI bleed from known gastric angiodysplastic lesions. R/O varices, gastropathy, ulcer, MW tear and other etiologies.  2. ABL and chronic iron deficiency due to chronic blood loss. 3. Cardiac cirrhosis with worsening encephalopathy and elevated bilirubin.  4. Thrombocytopenia, chronic.   Recommendations:  1. NPO except meds.  2. IV pantoprazole, IV octreotide, IV ceftriaxone, IVFs 3. Trend CBC and transfuse to keep Hb > 8. Hold Fe for  now. 4. Transfer to SDU 5. Continue higher dose lactulose and rifaximin. Trend ammonia. 6. EGD tomorrow with Dr. Ardis Hughs    LOS: 1 day   Pricilla Riffle. Fuller Plan MD  04/30/2016, 6:35 PM [phone] Mon-Fri 8a-5p  [phone] after 5p, weekends, holidays

## 2016-04-30 NOTE — Progress Notes (Signed)
Pt vomited twice during AM shift (Coffee ground and brown color) approx 100 cc. ENT consulted this am, diet is changed to NPO, IV fluid started @50cc /hr, plan to shift to stepdown for higher level of care, family member is in bed side and aware of the situation.

## 2016-05-01 ENCOUNTER — Encounter (HOSPITAL_COMMUNITY)
Admission: EM | Disposition: A | Discharge status: Home Health | Payer: Self-pay | Source: Home / Self Care | Attending: Internal Medicine

## 2016-05-01 ENCOUNTER — Encounter (HOSPITAL_COMMUNITY): Payer: Self-pay | Admitting: Physician Assistant

## 2016-05-01 ENCOUNTER — Inpatient Hospital Stay (HOSPITAL_COMMUNITY): Payer: Medicare Other | Admitting: Anesthesiology

## 2016-05-01 ENCOUNTER — Inpatient Hospital Stay (HOSPITAL_COMMUNITY): Payer: Medicare Other

## 2016-05-01 ENCOUNTER — Inpatient Hospital Stay: Payer: Medicare Other | Admitting: Family Medicine

## 2016-05-01 DIAGNOSIS — R040 Epistaxis: Secondary | ICD-10-CM | POA: Diagnosis present

## 2016-05-01 HISTORY — PX: ESOPHAGOGASTRODUODENOSCOPY: SHX5428

## 2016-05-01 LAB — COMPREHENSIVE METABOLIC PANEL
ALBUMIN: 2.7 g/dL — AB (ref 3.5–5.0)
ALT: 26 U/L (ref 17–63)
AST: 33 U/L (ref 15–41)
Alkaline Phosphatase: 127 U/L — ABNORMAL HIGH (ref 38–126)
Anion gap: 9 (ref 5–15)
BUN: 43 mg/dL — AB (ref 6–20)
CHLORIDE: 102 mmol/L (ref 101–111)
CO2: 25 mmol/L (ref 22–32)
Calcium: 9 mg/dL (ref 8.9–10.3)
Creatinine, Ser: 2.19 mg/dL — ABNORMAL HIGH (ref 0.61–1.24)
GFR calc Af Amer: 31 mL/min — ABNORMAL LOW (ref >60)
GFR, EST NON AFRICAN AMERICAN: 27 mL/min — AB (ref >60)
GLUCOSE: 116 mg/dL — AB (ref 65–99)
POTASSIUM: 4.1 mmol/L (ref 3.5–5.1)
Sodium: 136 mmol/L (ref 135–145)
Total Bilirubin: 5.5 mg/dL — ABNORMAL HIGH (ref 0.3–1.2)
Total Protein: 5 g/dL — ABNORMAL LOW (ref 6.5–8.1)

## 2016-05-01 LAB — CBC
HCT: 28.5 % — ABNORMAL LOW (ref 39.0–52.0)
Hemoglobin: 8.9 g/dL — ABNORMAL LOW (ref 13.0–17.0)
MCH: 27 pg (ref 26.0–34.0)
MCHC: 31.2 g/dL (ref 30.0–36.0)
MCV: 86.4 fL (ref 78.0–100.0)
PLATELETS: 52 10*3/uL — AB (ref 150–400)
RBC: 3.3 MIL/uL — ABNORMAL LOW (ref 4.22–5.81)
RDW: 19 % — AB (ref 11.5–15.5)
WBC: 5.2 10*3/uL (ref 4.0–10.5)

## 2016-05-01 LAB — TYPE AND SCREEN
ABO/RH(D): A POS
Antibody Screen: NEGATIVE
UNIT DIVISION: 0
Unit division: 0
Unit division: 0

## 2016-05-01 LAB — PROTIME-INR
INR: 1.33 (ref 0.00–1.49)
PROTHROMBIN TIME: 16.6 s — AB (ref 11.6–15.2)

## 2016-05-01 LAB — MRSA PCR SCREENING: MRSA by PCR: NEGATIVE

## 2016-05-01 SURGERY — EGD (ESOPHAGOGASTRODUODENOSCOPY)
Anesthesia: Monitor Anesthesia Care

## 2016-05-01 MED ORDER — LACTULOSE 10 GM/15ML PO SOLN
30.0000 g | Freq: Three times a day (TID) | ORAL | Status: DC
  Administered 2016-05-01 – 2016-05-05 (×10): 30 g via ORAL
  Filled 2016-05-01 (×10): qty 45

## 2016-05-01 MED ORDER — SODIUM CHLORIDE 0.9% FLUSH
3.0000 mL | Freq: Two times a day (BID) | INTRAVENOUS | Status: DC
  Administered 2016-05-01 – 2016-05-05 (×6): 3 mL via INTRAVENOUS

## 2016-05-01 MED ORDER — PROPOFOL 500 MG/50ML IV EMUL
INTRAVENOUS | Status: DC | PRN
Start: 2016-05-01 — End: 2016-05-02
  Administered 2016-05-01: 25 ug/kg/min via INTRAVENOUS

## 2016-05-01 MED ORDER — ONDANSETRON HCL 4 MG/2ML IJ SOLN
INTRAMUSCULAR | Status: AC
  Administered 2016-05-01: 4 mg via INTRAVENOUS
  Filled 2016-05-01: qty 2

## 2016-05-01 MED ORDER — DEXTROSE 5 % IV SOLN
1.0000 g | INTRAVENOUS | Status: DC
  Administered 2016-05-01: 1 g via INTRAVENOUS
  Filled 2016-05-01 (×2): qty 10

## 2016-05-01 MED ORDER — SALINE SPRAY 0.65 % NA SOLN
1.0000 | NASAL | Status: DC
  Administered 2016-05-01 – 2016-05-05 (×26): 1 via NASAL
  Filled 2016-05-01: qty 44

## 2016-05-01 MED ORDER — MAGNESIUM OXIDE 400 (241.3 MG) MG PO TABS
400.0000 mg | ORAL_TABLET | Freq: Every day | ORAL | Status: DC
  Administered 2016-05-02 – 2016-05-05 (×4): 400 mg via ORAL
  Filled 2016-05-01 (×4): qty 1

## 2016-05-01 MED ORDER — FUROSEMIDE 10 MG/ML IJ SOLN
40.0000 mg | Freq: Once | INTRAMUSCULAR | Status: AC
  Administered 2016-05-01: 40 mg via INTRAVENOUS
  Filled 2016-05-01: qty 4

## 2016-05-01 MED ORDER — OXYMETAZOLINE HCL 0.05 % NA SOLN
1.0000 | Freq: Two times a day (BID) | NASAL | Status: DC
  Administered 2016-05-01 – 2016-05-05 (×8): 1 via NASAL
  Filled 2016-05-01: qty 15

## 2016-05-01 MED ORDER — RIFAXIMIN 550 MG PO TABS
550.0000 mg | ORAL_TABLET | Freq: Two times a day (BID) | ORAL | Status: DC
  Administered 2016-05-01 – 2016-05-05 (×8): 550 mg via ORAL
  Filled 2016-05-01 (×9): qty 1

## 2016-05-01 MED ORDER — ONDANSETRON HCL 4 MG/2ML IJ SOLN
4.0000 mg | Freq: Four times a day (QID) | INTRAMUSCULAR | Status: DC | PRN
  Administered 2016-05-01 – 2016-05-05 (×3): 4 mg via INTRAVENOUS
  Filled 2016-05-01: qty 2

## 2016-05-01 MED ORDER — RENA-VITE PO TABS
1.0000 | ORAL_TABLET | Freq: Every day | ORAL | Status: DC
  Administered 2016-05-01 – 2016-05-04 (×4): 1 via ORAL
  Filled 2016-05-01 (×4): qty 1

## 2016-05-01 MED ORDER — PANTOPRAZOLE SODIUM 40 MG IV SOLR
40.0000 mg | Freq: Two times a day (BID) | INTRAVENOUS | Status: DC
  Administered 2016-05-01 – 2016-05-03 (×6): 40 mg via INTRAVENOUS
  Filled 2016-05-01 (×6): qty 40

## 2016-05-01 MED ORDER — LACTATED RINGERS IV SOLN
INTRAVENOUS | Status: DC | PRN
  Administered 2016-05-01: 13:00:00 via INTRAVENOUS

## 2016-05-01 MED ORDER — DIGOXIN 125 MCG PO TABS
0.1250 mg | ORAL_TABLET | ORAL | Status: DC
  Administered 2016-05-03 – 2016-05-05 (×2): 0.125 mg via ORAL
  Filled 2016-05-01 (×2): qty 1

## 2016-05-01 MED ORDER — ENSURE ENLIVE PO LIQD
237.0000 mL | Freq: Two times a day (BID) | ORAL | Status: DC
  Administered 2016-05-02 – 2016-05-05 (×3): 237 mL via ORAL

## 2016-05-01 NOTE — Care Management Important Message (Signed)
Important Message  Patient Details  Name: Hunter [name] MRN: [id number] Date of Birth: 1935-12-02   Medicare Important Message Given:  Yes    Nathen May 05/01/2016, 1:24 PM

## 2016-05-01 NOTE — Progress Notes (Signed)
Daily Rounding Note  05/01/2016, 8:26 AM  LOS: 2 days   SUBJECTIVE:       CG and brown emesis this AM.  BPs 80s-105/ 41-53,  pulse ~ 80  OBJECTIVE:         Vital signs in last 24 hours:    Temp:  [98 F (36.7 C)-98.6 F (37 C)] 98 F (36.7 C) (05/22 0800) Pulse Rate:  [79-85] 79 (05/22 0800) Resp:  [12-20] 14 (05/22 0800) BP: (89-110)/(37-57) 105/52 mmHg (05/22 0800) SpO2:  [95 %-100 %] 96 % (05/22 0800) Weight:  [74.1 kg (163 lb 5.8 oz)] 74.1 kg (163 lb 5.8 oz) (05/22 0030) Last BM Date: 04/30/16 Filed Weights   04/29/16 2331 05/01/16 0030  Weight: 74.934 kg (165 lb 3.2 oz) 74.1 kg (163 lb 5.8 oz)   General: resting quietly, did not examine other than visually   Heart: regular, paced rhythm Chest: breathing unlabored, even.  No cough.   Abdomen: not distended.    Extremities: no edema Neuro/Psych:  Sleeping, no tremors.    Intake/Output from previous day: 05/21 0701 - 05/22 0700 In: 1566.8 [P.O.:440; I.V.:795.8; Blood:281; IV Piggyback:50] Out: [id number]  Intake/Output this shift:    Lab Results:  Recent Labs  04/29/16 1857 04/30/16 1318 05/01/16 0350  WBC 3.9* 4.8 5.2  HGB 7.2* 9.1* 8.9*  HCT 23.9* 29.6* 28.5*  PLT 61* 54* 52*   BMET  Recent Labs  04/29/16 1857 04/30/16 1318 05/01/16 0350  NA 134* 132* 136  K 4.4 4.0 4.1  CL 100* 101 102  CO2 25 23 25   GLUCOSE 127* 166* 116*  BUN 46* 45* 43*  CREATININE 2.43* 2.38* 2.19*  CALCIUM 8.9 9.0 9.0   LFT  Recent Labs  04/29/16 1857 04/30/16 1318 05/01/16 0350  PROT 5.1* 5.4* 5.0*  ALBUMIN 2.7* 2.8* 2.7*  AST 36 40 33  ALT 26 29 26   ALKPHOS 137* 137* 127*  BILITOT 2.3* 8.1* 5.5*   PT/INR  Recent Labs  04/29/16 1857 05/01/16 0350  LABPROT 16.7* 16.6*  INR 1.34 1.33   Hepatitis Panel No results for input(s): HEPBSAG, HCVAB, HEPAIGM, HEPBIGM in the last 72 hours.  Studies/Results: Ct Head Wo Contrast  04/29/2016   CLINICAL DATA:  Acute onset of altered mental status. Generalized weakness, unsteady gait and aphasia. Initial encounter. EXAM: CT HEAD WITHOUT CONTRAST TECHNIQUE: Contiguous axial images were obtained from the base of the skull through the vertex without intravenous contrast. COMPARISON:  None. FINDINGS: There is no evidence of acute infarction, mass lesion, or intra- or extra-axial hemorrhage on CT. Prominence of the ventricles and sulci reflects moderate cortical volume loss. Cerebellar atrophy is noted. Scattered periventricular white matter change likely reflects small vessel ischemic microangiopathy. The brainstem and fourth ventricle are within normal limits. The basal ganglia are unremarkable in appearance. The cerebral hemispheres demonstrate grossly normal gray-white differentiation. No mass effect or midline shift is seen. There is no evidence of fracture; visualized osseous structures are unremarkable in appearance. The orbits are within normal limits. There is partial opacification of the right maxillary sinus. The remaining paranasal sinuses and mastoid air cells are well-aerated. No significant soft tissue abnormalities are seen. IMPRESSION: 1. No acute intracranial pathology seen on CT. 2. Moderate cortical volume loss and scattered small vessel ischemic microangiopathy. 3. Partial opacification of the right maxillary sinus. Electronically Signed   By: Garald Balding M.D.   On: 04/29/2016 19:59   Scheduled Meds: . sodium  chloride  10 mL/hr Intravenous Once  . cefTRIAXone (ROCEPHIN)  IV  1 g Intravenous Q24H  . digoxin  0.125 mg Oral Once per day on Mon Wed Fri  . lactulose  30 g Oral Q6H  . magnesium oxide  200 mg Oral Daily  . mupirocin ointment   Nasal BID  . oxymetazoline  2 spray Each Nare BID  . pantoprazole (PROTONIX) IV  40 mg Intravenous Q12H  . rifaximin  550 mg Oral BID  . sodium chloride  2 spray Each Nare Q2H while awake  . sodium chloride flush  3 mL Intravenous Q12H    Continuous Infusions: . sodium chloride 50 mL/hr at 04/30/16 1901  . octreotide  (SANDOSTATIN)    IV infusion 50 mcg/hr (05/01/16 0358)   PRN Meds:.acetaminophen **OR** acetaminophen, ondansetron, sodium chloride   ASSESMENT:   *  Hematemesis in setting of epistaxis.  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  Last EGD 08/2015: APC ablation of bleeding gastric AVMs.  R/o source of epistaxis vs GI.   Empiric Octreotide gtt, BID IV Protonix in place.   *  Blood loss anemia.  Nadir Hgb 7.2.  S/p PRBC x 3.  Note Hgb 6.3 on 5/12 and inpt transfusion x 2. Feraheme infusions 11/26/15, 12/03/15, 03/06/16, 03/13/16.   *  Thrombocytopenia.   *  Stage 4 CKD.   *  Hx HE.  On chronic Lactulose and Rifaximin.     PLAN   *  EGD set for 1 PM today.   *  CBC in AM.      Hunter [name]  05/01/2016, 8:26 AM Pager: [phone]

## 2016-05-01 NOTE — Progress Notes (Signed)
Day of Surgery  Subjective: Events of the day noted.  Discussed patient with Dr. Ardis Hughs.  Had bleeding from upper airway during upper endoscopy and stomach could not be fully evaluated.  Some concern for aspiration.  Now in step-down.  Has not thrown up blood since last evening.  No further nose bleeding since procedure.  Objective: Vital signs in last 24 hours: Temp:  [97 F (36.1 C)-98.6 F (37 C)] 97 F (36.1 C) (05/22 1600) Pulse Rate:  [79-85] 82 (05/22 1700) Resp:  [12-35] 34 (05/22 1700) BP: (89-126)/(38-65) 111/56 mmHg (05/22 1700) SpO2:  [95 %-100 %] 98 % (05/22 1700) Weight:  [74.1 kg (163 lb 5.8 oz)] 74.1 kg (163 lb 5.8 oz) (05/22 0030) Last BM Date: 05/01/16  Intake/Output from previous day: 05/21 0701 - 05/22 0700 In: 1566.8 [P.O.:440; I.V.:795.8; Blood:281; IV Piggyback:50] Out: 1701 [id number] Intake/Output this shift: Total I/O In: -  Out: 850 [Urine:550; Blood:300]  General appearance: sleepy but rouseable, oxygen mask Nose: dry blood in both nares, no active bleeding.  Lab Results:   Recent Labs  04/30/16 1318 05/01/16 0350  WBC 4.8 5.2  HGB 9.1* 8.9*  HCT 29.6* 28.5*  PLT 54* 52*   BMET  Recent Labs  04/30/16 1318 05/01/16 0350  NA 132* 136  K 4.0 4.1  CL 101 102  CO2 23 25  GLUCOSE 166* 116*  BUN 45* 43*  CREATININE 2.38* 2.19*  CALCIUM 9.0 9.0   PT/INR  Recent Labs  04/29/16 1857 05/01/16 0350  LABPROT 16.7* 16.6*  INR 1.34 1.33   ABG No results for input(s): PHART, HCO3 in the last 72 hours.  Invalid input(s): PCO2, PO2  Studies/Results: Ct Head Wo Contrast  04/29/2016  CLINICAL DATA:  Acute onset of altered mental status. Generalized weakness, unsteady gait and aphasia. Initial encounter. EXAM: CT HEAD WITHOUT CONTRAST TECHNIQUE: Contiguous axial images were obtained from the base of the skull through the vertex without intravenous contrast. COMPARISON:  None. FINDINGS: There is no evidence of acute infarction, mass  lesion, or intra- or extra-axial hemorrhage on CT. Prominence of the ventricles and sulci reflects moderate cortical volume loss. Cerebellar atrophy is noted. Scattered periventricular white matter change likely reflects small vessel ischemic microangiopathy. The brainstem and fourth ventricle are within normal limits. The basal ganglia are unremarkable in appearance. The cerebral hemispheres demonstrate grossly normal gray-white differentiation. No mass effect or midline shift is seen. There is no evidence of fracture; visualized osseous structures are unremarkable in appearance. The orbits are within normal limits. There is partial opacification of the right maxillary sinus. The remaining paranasal sinuses and mastoid air cells are well-aerated. No significant soft tissue abnormalities are seen. IMPRESSION: 1. No acute intracranial pathology seen on CT. 2. Moderate cortical volume loss and scattered small vessel ischemic microangiopathy. 3. Partial opacification of the right maxillary sinus. Electronically Signed   By: Garald Balding M.D.   On: 04/29/2016 19:59   Dg Chest Port 1 View  05/01/2016  CLINICAL DATA:  Aspiration of blood from nose bleed. Hx of CHF, and pulmonary HTN. Pt is a nonsmoker. EXAM: PORTABLE CHEST 1 VIEW COMPARISON:  04/21/2016 FINDINGS: Pacer with tip at right ventricle. Numerous leads and wires project over the chest. Midline trachea. Moderate cardiomegaly. Atherosclerosis in the transverse aorta. No right and no definite left pleural effusion. No pneumothorax. Interstitial prominence is moderate. Patchy left lower and mid lung airspace disease. IMPRESSION: Cardiomegaly with pulmonary interstitial prominence, suspicious for pulmonary venous congestion. Patchy left-sided airspace disease which  could represent alveolar edema or especially given the clinical history aspiration versus infection. Electronically Signed   By: Abigail Miyamoto M.D.   On: 05/01/2016 14:40     Anti-infectives: Anti-infectives    Start     Dose/Rate Route Frequency Ordered Stop   05/01/16 2200  rifaximin (XIFAXAN) tablet 550 mg     550 mg Oral 2 times daily 05/01/16 1521     05/01/16 1600  cefTRIAXone (ROCEPHIN) 1 g in dextrose 5 % 50 mL IVPB     1 g 100 mL/hr over 30 Minutes Intravenous Every 24 hours 05/01/16 1521     04/30/16 1900  cefTRIAXone (ROCEPHIN) 1 g in dextrose 5 % 50 mL IVPB  Status:  Discontinued     1 g 100 mL/hr over 30 Minutes Intravenous Every 24 hours 04/30/16 1819 05/01/16 1455   04/30/16 0030  rifaximin (XIFAXAN) tablet 550 mg  Status:  Discontinued     550 mg Oral 2 times daily 04/29/16 2356 05/01/16 1455      Assessment/Plan: Epistaxis, hematemesis, HHT s/p Procedure(s): ESOPHAGOGASTRODUODENOSCOPY (EGD) (N/A) Discussed case with Dr. Ardis Hughs.  Clearly, nosebleeding is contributing to his problems.  I still wonder about a stomach source for hematemesis.  Dr. Ardis Hughs and I will plan further evaluation and treatment under general anesthesia, tentatively Wednesday if his lungs are stable enough.  This will involve nasal endoscopy with cautery and upper GI endoscopy with an endotracheal tube in place.  LOS: 2 days    Domenica Weightman 05/01/2016

## 2016-05-01 NOTE — Progress Notes (Signed)
Sever nose bleed procedure terminated.

## 2016-05-01 NOTE — Interval H&P Note (Signed)
History and Physical Interval Note:  05/01/2016 1:28 PM  Hunter [name]  has presented today for surgery, with the diagnosis of hematemesis  The various methods of treatment have been discussed with the patient and family. After consideration of risks, benefits and other options for treatment, the patient has consented to  Procedure(s): ESOPHAGOGASTRODUODENOSCOPY (EGD) (N/A) as a surgical intervention .  The patient's history has been reviewed, patient examined, no change in status, stable for surgery.  I have reviewed the patient's chart and labs.  Questions were answered to the patient's satisfaction.     Milus Banister

## 2016-05-01 NOTE — H&P (View-Only) (Signed)
Daily Rounding Note  05/01/2016, 8:26 AM  LOS: 2 days   SUBJECTIVE:       CG and brown emesis this AM.  BPs 80s-105/ 41-53,  pulse ~ 80  OBJECTIVE:         Vital signs in last 24 hours:    Temp:  [98 F (36.7 C)-98.6 F (37 C)] 98 F (36.7 C) (05/22 0800) Pulse Rate:  [79-85] 79 (05/22 0800) Resp:  [12-20] 14 (05/22 0800) BP: (89-110)/(37-57) 105/52 mmHg (05/22 0800) SpO2:  [95 %-100 %] 96 % (05/22 0800) Weight:  [74.1 kg (163 lb 5.8 oz)] 74.1 kg (163 lb 5.8 oz) (05/22 0030) Last BM Date: 04/30/16 Filed Weights   04/29/16 2331 05/01/16 0030  Weight: 74.934 kg (165 lb 3.2 oz) 74.1 kg (163 lb 5.8 oz)   General: resting quietly, did not examine other than visually   Heart: regular, paced rhythm Chest: breathing unlabored, even.  No cough.   Abdomen: not distended.    Extremities: no edema Neuro/Psych:  Sleeping, no tremors.    Intake/Output from previous day: 05/21 0701 - 05/22 0700 In: 1566.8 [P.O.:440; I.V.:795.8; Blood:281; IV Piggyback:50] Out: [id number]  Intake/Output this shift:    Lab Results:  Recent Labs  04/29/16 1857 04/30/16 1318 05/01/16 0350  WBC 3.9* 4.8 5.2  HGB 7.2* 9.1* 8.9*  HCT 23.9* 29.6* 28.5*  PLT 61* 54* 52*   BMET  Recent Labs  04/29/16 1857 04/30/16 1318 05/01/16 0350  NA 134* 132* 136  K 4.4 4.0 4.1  CL 100* 101 102  CO2 25 23 25   GLUCOSE 127* 166* 116*  BUN 46* 45* 43*  CREATININE 2.43* 2.38* 2.19*  CALCIUM 8.9 9.0 9.0   LFT  Recent Labs  04/29/16 1857 04/30/16 1318 05/01/16 0350  PROT 5.1* 5.4* 5.0*  ALBUMIN 2.7* 2.8* 2.7*  AST 36 40 33  ALT 26 29 26   ALKPHOS 137* 137* 127*  BILITOT 2.3* 8.1* 5.5*   PT/INR  Recent Labs  04/29/16 1857 05/01/16 0350  LABPROT 16.7* 16.6*  INR 1.34 1.33   Hepatitis Panel No results for input(s): HEPBSAG, HCVAB, HEPAIGM, HEPBIGM in the last 72 hours.  Studies/Results: Ct Head Wo Contrast  04/29/2016   CLINICAL DATA:  Acute onset of altered mental status. Generalized weakness, unsteady gait and aphasia. Initial encounter. EXAM: CT HEAD WITHOUT CONTRAST TECHNIQUE: Contiguous axial images were obtained from the base of the skull through the vertex without intravenous contrast. COMPARISON:  None. FINDINGS: There is no evidence of acute infarction, mass lesion, or intra- or extra-axial hemorrhage on CT. Prominence of the ventricles and sulci reflects moderate cortical volume loss. Cerebellar atrophy is noted. Scattered periventricular white matter change likely reflects small vessel ischemic microangiopathy. The brainstem and fourth ventricle are within normal limits. The basal ganglia are unremarkable in appearance. The cerebral hemispheres demonstrate grossly normal gray-white differentiation. No mass effect or midline shift is seen. There is no evidence of fracture; visualized osseous structures are unremarkable in appearance. The orbits are within normal limits. There is partial opacification of the right maxillary sinus. The remaining paranasal sinuses and mastoid air cells are well-aerated. No significant soft tissue abnormalities are seen. IMPRESSION: 1. No acute intracranial pathology seen on CT. 2. Moderate cortical volume loss and scattered small vessel ischemic microangiopathy. 3. Partial opacification of the right maxillary sinus. Electronically Signed   By: Garald Balding M.D.   On: 04/29/2016 19:59   Scheduled Meds: . sodium  chloride  10 mL/hr Intravenous Once  . cefTRIAXone (ROCEPHIN)  IV  1 g Intravenous Q24H  . digoxin  0.125 mg Oral Once per day on Mon Wed Fri  . lactulose  30 g Oral Q6H  . magnesium oxide  200 mg Oral Daily  . mupirocin ointment   Nasal BID  . oxymetazoline  2 spray Each Nare BID  . pantoprazole (PROTONIX) IV  40 mg Intravenous Q12H  . rifaximin  550 mg Oral BID  . sodium chloride  2 spray Each Nare Q2H while awake  . sodium chloride flush  3 mL Intravenous Q12H    Continuous Infusions: . sodium chloride 50 mL/hr at 04/30/16 1901  . octreotide  (SANDOSTATIN)    IV infusion 50 mcg/hr (05/01/16 0358)   PRN Meds:.acetaminophen **OR** acetaminophen, ondansetron, sodium chloride   ASSESMENT:   *  Hematemesis in setting of epistaxis.  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  Last EGD 08/2015: APC ablation of bleeding gastric AVMs.  R/o source of epistaxis vs GI.   Empiric Octreotide gtt, BID IV Protonix in place.   *  Blood loss anemia.  Nadir Hgb 7.2.  S/p PRBC x 3.  Note Hgb 6.3 on 5/12 and inpt transfusion x 2. Feraheme infusions 11/26/15, 12/03/15, 03/06/16, 03/13/16.   *  Thrombocytopenia.   *  Stage 4 CKD.   *  Hx HE.  On chronic Lactulose and Rifaximin.     PLAN   *  EGD set for 1 PM today.   *  CBC in AM.      Hunter [name]  05/01/2016, 8:26 AM Pager: [phone]

## 2016-05-01 NOTE — Progress Notes (Signed)
PT Cancellation Note  Patient Details Name: Hunter [name] MRN: [id number] DOB: [birth year]   Cancelled Treatment:    Reason Eval/Treat Not Completed: Patient at procedure or test/unavailable (Pt currently off floor for EGD).  Will follow.  Collie Siad PT, DPT  Pager: [phone] Phone: [phone] 05/01/2016, 12:14 PM

## 2016-05-01 NOTE — Op Note (Signed)
[facility] Patient Name: Hunter [name] Procedure Date : 05/01/2016 MRN: [id number] Attending MD: Milus Banister , MD Date of Birth: 1935/02/15 CSN: [id number] Age: 81 Admit Type: Inpatient Procedure:                Upper GI endoscopy Indications:              known chronic epistaxis, suspected upper                            gastrointestinal bleeding (had several gastric AVMs                            noted in stomach 2016, also history of cirrhosis). Providers:                Milus Banister, MD, Cleda Daub, RN, Cletis Athens, Technician Referring MD:              Medicines:                Monitored Anesthesia Care Complications:            No immediate complications. Estimated blood loss:                            50-172ml Estimated Blood Loss:     Estimated blood loss: 50-147ml Procedure:                Pre-Anesthesia Assessment:                           - Prior to the procedure, a History and Physical                            was performed, and patient medications and                            allergies were reviewed. The patient's tolerance of                            previous anesthesia was also reviewed. The risks                            and benefits of the procedure and the sedation                            options and risks were discussed with the patient.                            All questions were answered, and informed consent                            was obtained. Prior Anticoagulants: The patient has  taken no previous anticoagulant or antiplatelet                            agents. ASA Grade Assessment: III - A patient with                            severe systemic disease. After reviewing the risks                            and benefits, the patient was deemed in                            satisfactory condition to undergo the procedure.                           After  obtaining informed consent, the endoscope was                            passed under direct vision. Throughout the                            procedure, the patient's blood pressure, pulse, and                            oxygen saturations were monitored continuously. The                            [id number]) scope was introduced through the                            mouth, with the intention of advancing to the                            stomach. The scope was advanced to the gastric body                            before the procedure was aborted. Medications were                            given. The upper GI endoscopy was accomplished                            without difficulty. The patient tolerated the                            procedure poorly due to profuse bleeding from nose,                            mouth. The procedure was aborted due to excessive                            bleeding. Scope In: Scope Out: Findings:      There was some old appearing dry red blood in  oropharynx at insertion of       the gastroscope. While passing the scope into his esophagus and stomach       he began to have profuse bleeding from nose and mouth. Blood was coming       out of his left nostril (he was laying on his left side). Visualization       in the esophagus was very limited due to red blood tracking down from       his mouth. He was coughing and gurgling. I never had good visualization       of the stomach before the procedure was aborted for brisk nose bleeding,       concern about airway protection. Suctioning was performed to his mouth       and nostrils. Sedation was allowed to wear off. The bleeding eventually       ceased without need for ET intubation for airway protection. Impression:               - The procedure was aborted due to excessive                            bleeding from nose, mouth very shortly after                            starting the procedure.                            - Visualization of the stomach, esophagus very                            limited by acute red blood tracking down from his                            mouth                           - No specimens collected. Moderate Sedation:      none Recommendation:           - The findings and recommendations were discussed                            with the Dr. Redmond Baseman from ENT; he recommended Afrin                            spray to both nostrils, I passed this information                            on to hospitalist.                           - No plans for repeat EGD at this point. Will                            follow along                           - I suspect he will  have melena from the blood that                            was swallowed.                           - Can stop octreotide drip, I do not think this is                            portal hypertensive related. Procedure Code(s):        --- Professional ---                           [phone], 52, Esophagogastroduodenoscopy, flexible,                            transoral; diagnostic, including collection of                            specimen(s) by brushing or washing, when performed                            (separate procedure) Diagnosis Code(s):        --- Professional ---                           Z53.8, Procedure and treatment not carried out for                            other reasons CPT copyright 2016 [employer]. All rights reserved. The codes documented in this report are preliminary and upon coder review may  be revised to meet current compliance requirements. Milus Banister, MD 05/01/2016 2:13:50 PM This report has been signed electronically. Number of Addenda: 0

## 2016-05-01 NOTE — Progress Notes (Addendum)
PROGRESS NOTE  Hunter [name] [name] DOB: 12-27-1934 DOA: 04/29/2016 PCP: Penni Homans, MD   LOS: 2 days   Brief Narrative: 81 y.o. male with medical history significant of Hepatic cirrhosis with history of hereditary hemorrhagic telangiectasia with chronic anemia due to chronic blood loss, Chronic diastolic heart failure, sp pacemaker, recurrent hepatic encephalopathy chronic kidney disease recurrent epistaxis, presented to the emergency room on 5/20 with unrelenting nosebleed resulting in vomiting blood.   Interval history  Patient admitted to the hospital as above, ENT was consulted and evaluated patient on 5/21, no active bleeding was seen however old blood clots which were removed. On 5/21 in the evening, patient started vomiting bright red blood without active epistaxis, likely to represent a GI bleed in addition to his nosebleeds. Gastroenterology was consulted, patient was started on Protonix, octreotide, ceftriaxone and moved to step down. Plan for EGD 5/22.   Assessment & Plan: Active Problems:   Hepatic AV fistula (HCC)   HHT (hereditary hemorrhagic telangiectasia) (HCC)   Anemia due to chronic blood loss   Cardiac cirrhosis   Chronic kidney disease   Cirrhosis (HCC)   Chronic diastolic heart failure (HCC)   Hepatic encephalopathy (HCC)   S/P placement of cardiac pacemaker  Addendum 6 pm Events from EGD noted, patient with severe epistaxis and case needed to be aborted. Patient evaluated after, he seems to have aspirated, has coarse breath sounds. CXR with patchy left sided airspace and fluid overload is apparent. Will dose 40 IV Lasix now. Discussed with Drs. Ardis Hughs and Redmond Baseman, they will plan for Wednesday evaluation under GA.  Acute GI bleed  - Likely source of upper GI, clinical picture clouded by the fact that he has known recurrent epistaxis. He has known angiodysplastic lesions, seen on most recent EGD on September 2016. There is no reported esophageal varices  on most recent EGD - Continue IV Protonix, continue octreotide, ceftriaxone - Appreciate gastroenterology input, plan for EGD today - No further hematemesis overnight - Continue to closely monitor in step down, high potential for decompensation  Acute on chronic recurrent epistaxis - Patient's epistaxis has recently gotten worse in the last day. This morning on my evaluation he had a postnasal drip with bright red blood, which she swallowed several times and then had the bloody emesis which I saw and the emesis bag - Consulted ENT, discussed with Dr. Redmond Baseman who evaluated patient at bedside, appreciate input.  - His epistaxis has resolved  Hepatic encephalopathy - In the setting of liver cirrhosis - Ammonia elevated at 83 on admission, currently on lactulose 30 g every 6 hours, monitor mental status - Continue rifaximin as well - Mental status is clear this morning  Anemia due to acute on chronic blood loss - Related to hereditary hemorrhagic telangiectasia, chronic epistaxis - Hemoglobin 7.2 on admission, ordered 3 units packed red blood cells on admission - Continue iron supplements - Hemoglobin 8.9 this morning, continue to monitor   Liver cirrhosis - Appears decompensated with encephalopathy, elevated bilirubin, thrombocytopenia  Chronic diastolic CHF - Seems clinically compensated, closely monitored while getting blood products - Continue digoxin, hold diuretics due to hypotension active GI bleed  - Closely monitor fluid status   Hyponatremia - Mild, related to liver disease as well as heart failure  - Resolved today   Pancytopenia - Secondary to cirrhosis. Follow CBCs closely.  Stage IV chronic kidney disease - Creatinine appears to be at baseline,   Hereditary hemorrhagic telangiectasia (HHT)   DVT prophylaxis: SCDs Code Status:  DNR. This was confirmed with wife on 5/21 Family Communication: Discussed with wife at bedside  Disposition Plan: TBD  Consultants:    ENT   Gastroenterology   Procedures:   None   Antimicrobials:  Ceftriaxone 5/21>>  Subjective: - He is sleeping this morning, had a quiet night, per his wife who is at bedside his had no further hematemesis. Patient wakes up easily, has no complaints for me, appears clear  Objective: Filed Vitals:   05/01/16 0500 05/01/16 0600 05/01/16 0612 05/01/16 0800  BP: 93/48 89/41 99/44  105/52  Pulse: 80 80 80 79  Temp:    98 F (36.7 C)  TempSrc:    Axillary  Resp: 13 17 16 14   Height:      Weight:      SpO2: 97% 98% 100% 96%    Intake/Output Summary (Last 24 hours) at 05/01/16 0956 Last data filed at 05/01/16 0600  Gross per 24 hour  Intake 1446.84 ml  Output   1700 ml  Net -253.16 ml   Filed Weights   04/29/16 2331 05/01/16 0030  Weight: 74.934 kg (165 lb 3.2 oz) 74.1 kg (163 lb 5.8 oz)    Examination: Constitutional: Week appearing male in no distress   Filed Vitals:   05/01/16 0500 05/01/16 0600 05/01/16 0612 05/01/16 0800  BP: 93/48 89/41 99/44  105/52  Pulse: 80 80 80 79  Temp:    98 F (36.7 C)  TempSrc:    Axillary  Resp: 13 17 16 14   Height:      Weight:      SpO2: 97% 98% 100% 96%   Eyes: PERRL, lids and conjunctivae normal, mild scleral icterus  ENMT: red raw mucosa and bilateral nostrils  Respiratory: clear to auscultation bilaterally, no wheezing, no crackles. Normal respiratory effort. No accessory muscle use.  Cardiovascular: Regular rate and rhythm, no murmurs / rubs / gallops. Trace LE edema. 2+ pedal pulses.  Abdomen: no tenderness. Bowel sounds positive.  Musculoskeletal: no clubbing / cyanosis. .  Neurologic:  nonfocal.    Data Reviewed: I have personally reviewed following labs and imaging studies  CBC:  Recent Labs Lab 04/29/16 1857 04/30/16 1318 05/01/16 0350  WBC 3.9* 4.8 5.2  NEUTROABS 2.8  --   --   HGB 7.2* 9.1* 8.9*  HCT 23.9* 29.6* 28.5*  MCV 89.5 86.3 86.4  PLT 61* 54* 52*   Basic Metabolic Panel:  Recent  Labs Lab 04/29/16 1857 04/30/16 1318 05/01/16 0350  NA 134* 132* 136  K 4.4 4.0 4.1  CL 100* 101 102  CO2 25 23 25   GLUCOSE 127* 166* 116*  BUN 46* 45* 43*  CREATININE 2.43* 2.38* 2.19*  CALCIUM 8.9 9.0 9.0  MG  --  2.6*  --   PHOS  --  4.7*  --    GFR: Estimated Creatinine Clearance: 29.1 mL/min (by C-G formula based on Cr of 2.19). Liver Function Tests:  Recent Labs Lab 04/29/16 1857 04/30/16 1318 05/01/16 0350  AST 36 40 33  ALT 26 29 26   ALKPHOS 137* 137* 127*  BILITOT 2.3* 8.1* 5.5*  PROT 5.1* 5.4* 5.0*  ALBUMIN 2.7* 2.8* 2.7*   No results for input(s): LIPASE, AMYLASE in the last 168 hours.  Recent Labs Lab 04/29/16 1857 04/30/16 1318  AMMONIA 83* 60*   Coagulation Profile:  Recent Labs Lab 04/29/16 1857 05/01/16 0350  INR 1.34 1.33   Cardiac Enzymes: No results for input(s): CKTOTAL, CKMB, CKMBINDEX, TROPONINI in the last 168 hours. BNP (  last 3 results)  Recent Labs  08/09/15 1621  PROBNP 188.0*   HbA1C: No results for input(s): HGBA1C in the last 72 hours. CBG: No results for input(s): GLUCAP in the last 168 hours. Lipid Profile: No results for input(s): CHOL, HDL, LDLCALC, TRIG, CHOLHDL, LDLDIRECT in the last 72 hours. Thyroid Function Tests:  Recent Labs  04/30/16 1318  TSH 5.792*   Anemia Panel: No results for input(s): VITAMINB12, FOLATE, FERRITIN, TIBC, IRON, RETICCTPCT in the last 72 hours. Urine analysis:    Component Value Date/Time   COLORURINE YELLOW 04/29/2016 2107   APPEARANCEUR CLEAR 04/29/2016 2107   LABSPEC 1.011 04/29/2016 2107   PHURINE 7.0 04/29/2016 2107   GLUCOSEU NEGATIVE 04/29/2016 2107   HGBUR NEGATIVE 04/29/2016 2107   BILIRUBINUR NEGATIVE 04/29/2016 2107   KETONESUR NEGATIVE 04/29/2016 2107   PROTEINUR NEGATIVE 04/29/2016 2107   UROBILINOGEN 0.2 08/10/2015 1700   NITRITE NEGATIVE 04/29/2016 2107   LEUKOCYTESUR NEGATIVE 04/29/2016 2107   Sepsis Labs: Invalid input(s): PROCALCITONIN,  LACTICIDVEN  Recent Results (from the past 240 hour(s))  MRSA PCR Screening     Status: None   Collection Time: 05/01/16 12:30 AM  Result Value Ref Range Status   MRSA by PCR NEGATIVE NEGATIVE Final    Comment:        The GeneXpert MRSA Assay (FDA approved for NASAL specimens only), is one component of a comprehensive MRSA colonization surveillance program. It is not intended to diagnose MRSA infection nor to guide or monitor treatment for MRSA infections.       Radiology Studies: Ct Head Wo Contrast  04/29/2016  CLINICAL DATA:  Acute onset of altered mental status. Generalized weakness, unsteady gait and aphasia. Initial encounter. EXAM: CT HEAD WITHOUT CONTRAST TECHNIQUE: Contiguous axial images were obtained from the base of the skull through the vertex without intravenous contrast. COMPARISON:  None. FINDINGS: There is no evidence of acute infarction, mass lesion, or intra- or extra-axial hemorrhage on CT. Prominence of the ventricles and sulci reflects moderate cortical volume loss. Cerebellar atrophy is noted. Scattered periventricular white matter change likely reflects small vessel ischemic microangiopathy. The brainstem and fourth ventricle are within normal limits. The basal ganglia are unremarkable in appearance. The cerebral hemispheres demonstrate grossly normal gray-white differentiation. No mass effect or midline shift is seen. There is no evidence of fracture; visualized osseous structures are unremarkable in appearance. The orbits are within normal limits. There is partial opacification of the right maxillary sinus. The remaining paranasal sinuses and mastoid air cells are well-aerated. No significant soft tissue abnormalities are seen. IMPRESSION: 1. No acute intracranial pathology seen on CT. 2. Moderate cortical volume loss and scattered small vessel ischemic microangiopathy. 3. Partial opacification of the right maxillary sinus. Electronically Signed   By: Garald Balding  M.D.   On: 04/29/2016 19:59     Scheduled Meds: . sodium chloride  10 mL/hr Intravenous Once  . cefTRIAXone (ROCEPHIN)  IV  1 g Intravenous Q24H  . digoxin  0.125 mg Oral Once per day on Mon Wed Fri  . lactulose  30 g Oral Q6H  . magnesium oxide  200 mg Oral Daily  . mupirocin ointment   Nasal BID  . oxymetazoline  2 spray Each Nare BID  . pantoprazole (PROTONIX) IV  40 mg Intravenous Q12H  . rifaximin  550 mg Oral BID  . sodium chloride  2 spray Each Nare Q2H while awake  . sodium chloride flush  3 mL Intravenous Q12H   Continuous Infusions: . sodium  chloride 50 mL/hr at 04/30/16 1901  . octreotide  (SANDOSTATIN)    IV infusion 50 mcg/hr (05/01/16 0358)    Time spent: 35 minutes  Marzetta Board, MD, PhD [facility] Pager [phone]  If 7PM-7AM, please contact night-coverage [email or web address] Password [facility] 05/01/2016, 9:56 AM

## 2016-05-02 ENCOUNTER — Encounter (HOSPITAL_COMMUNITY): Payer: Self-pay | Admitting: Gastroenterology

## 2016-05-02 LAB — CBC
HEMATOCRIT: 29.8 % — AB (ref 39.0–52.0)
Hemoglobin: 9.1 g/dL — ABNORMAL LOW (ref 13.0–17.0)
MCH: 26.8 pg (ref 26.0–34.0)
MCHC: 30.5 g/dL (ref 30.0–36.0)
MCV: 87.9 fL (ref 78.0–100.0)
PLATELETS: 67 10*3/uL — AB (ref 150–400)
RBC: 3.39 MIL/uL — ABNORMAL LOW (ref 4.22–5.81)
RDW: 18.9 % — AB (ref 11.5–15.5)
WBC: 15.2 10*3/uL — AB (ref 4.0–10.5)

## 2016-05-02 LAB — BASIC METABOLIC PANEL
ANION GAP: 12 (ref 5–15)
BUN: 54 mg/dL — AB (ref 6–20)
CALCIUM: 8.7 mg/dL — AB (ref 8.9–10.3)
CO2: 23 mmol/L (ref 22–32)
CREATININE: 2.42 mg/dL — AB (ref 0.61–1.24)
Chloride: 101 mmol/L (ref 101–111)
GFR calc Af Amer: 28 mL/min — ABNORMAL LOW (ref >60)
GFR, EST NON AFRICAN AMERICAN: 24 mL/min — AB (ref >60)
GLUCOSE: 123 mg/dL — AB (ref 65–99)
Potassium: 4.6 mmol/L (ref 3.5–5.1)
Sodium: 136 mmol/L (ref 135–145)

## 2016-05-02 MED ORDER — PIPERACILLIN-TAZOBACTAM 3.375 G IVPB
3.3750 g | Freq: Three times a day (TID) | INTRAVENOUS | Status: DC
  Administered 2016-05-02 – 2016-05-04 (×5): 3.375 g via INTRAVENOUS
  Filled 2016-05-02 (×9): qty 50

## 2016-05-02 NOTE — Plan of Care (Signed)
Problem: Safety: Goal: Ability to remain free from injury will improve Outcome: Completed/Met Date Met:  05/02/16 Pt aware of [place] guidelines on safety. Pt wearing fall risk socks. Pt rings call bell when help is needed and is aware when needing to get up to ask for help. Call bell within reach.

## 2016-05-02 NOTE — Transfer of Care (Signed)
Immediate Anesthesia Transfer of Care Note  Patient: [name]  Procedure(s) Performed: Procedure(s): ESOPHAGOGASTRODUODENOSCOPY (EGD) (N/A)  Patient Location: PACU and Nursing Unit  Anesthesia Type:MAC  Level of Consciousness: awake, alert  and oriented  Airway & Oxygen Therapy: Patient Spontanous Breathing and Patient connected to face mask oxygen  Post-op Assessment: Report given to RN, Post -op Vital signs reviewed and stable and Patient moving all extremities  Post vital signs: Reviewed and stable  Last Vitals:  Filed Vitals:   05/02/16 0500 05/02/16 0600  BP: 95/42 102/51  Pulse: 80 80  Temp:    Resp: 21 21    Last Pain:  Filed Vitals:   05/02/16 0610  PainSc: 0-No pain         Complications: No apparent anesthesia complications

## 2016-05-02 NOTE — Anesthesia Postprocedure Evaluation (Addendum)
Anesthesia Post Note  Patient: Hunter [name]  Procedure(s) Performed: Procedure(s) (LRB): ESOPHAGOGASTRODUODENOSCOPY (EGD) (N/A)  Patient location during evaluation: PACU Anesthesia Type: MAC Level of consciousness: awake and alert Pain management: pain level controlled Vital Signs Assessment: post-procedure vital signs reviewed and stable Respiratory status: spontaneous breathing, nonlabored ventilation, respiratory function stable and patient connected to nasal cannula oxygen Cardiovascular status: stable and blood pressure returned to baseline Anesthetic complications: no Comments: Pt had nose bleed and procedure aborted. Small amount of sedation given. Pt awake after procedure and stable. CXR ordered to exclude major aspiration. Then discharged to step down unit to be followed by hospitalist and Dr Redmond Baseman from ENT.    Last Vitals:  Filed Vitals:   05/02/16 0600 05/02/16 0700  BP: 102/51 103/48  Pulse: 80 80  Temp:  36.5 C  Resp: 21 19    Last Pain:  Filed Vitals:   05/02/16 0744  PainSc: 0-No pain                 Jalyah Weinheimer DAVID

## 2016-05-02 NOTE — Progress Notes (Signed)
Pharmacy Antibiotic Note  Hunter [name] is a 81 y.o. male admitted on 04/29/2016 with aspiration PNA.  Pharmacy has been consulted for Zosyn dosing.  Patient was on ceftriaxone, now switching d/t increase in WBC and aspiration during EGD.  Plan: Zosyn 3.375g IV q8h (4 hour infusion).  Watch renal function and will adjust PRN  Height: 6' (182.9 cm) Weight: 163 lb 5.8 oz (74.1 kg) IBW/kg (Calculated) : 77.6  Temp (24hrs), Avg:97.7 F (36.5 C), Min:97 F (36.1 C), Max:98.1 F (36.7 C)   Recent Labs Lab 04/29/16 1857 04/30/16 1318 05/01/16 0350 05/02/16 0523  WBC 3.9* 4.8 5.2 15.2*  CREATININE 2.43* 2.38* 2.19* 2.42*    Estimated Creatinine Clearance: 26.4 mL/min (by C-G formula based on Cr of 2.42).    No Known Allergies  Antimicrobials this admission: CTX 5/21>>5/23 Zosyn 5/23>>  Dose adjustments this admission: n/a  Microbiology results: 5/22 MRSA PCR: neg  Thank you for allowing pharmacy to be a part of this patient's care.  Hunter Benegas D. Hinata Diener, PharmD, BCPS Clinical Pharmacist Pager: [phone] 05/02/2016 10:57 AM

## 2016-05-02 NOTE — Progress Notes (Signed)
PROGRESS NOTE  Hunter [name] [name] DOB: [birth year] DOA: 04/29/2016 PCP: Penni Homans, MD   LOS: 3 days   Brief Narrative: 81 y.o. male with medical history significant of Hepatic cirrhosis with history of hereditary hemorrhagic telangiectasia with chronic anemia due to chronic blood loss, Chronic diastolic heart failure, sp pacemaker, recurrent hepatic encephalopathy chronic kidney disease recurrent epistaxis, presented to the emergency room on 5/20 with unrelenting nosebleed resulting in vomiting blood.   Interval history  Patient admitted to the hospital as above, ENT was consulted and evaluated patient on 5/21, no active bleeding was seen however old blood clots which were removed. On 5/21 in the evening, patient started vomiting bright red blood without active epistaxis, likely to represent a GI bleed in addition to his nosebleeds. Gastroenterology was consulted, patient was started on Protonix, octreotide, ceftriaxone and moved to step down. He underwent an EGD on 5/22 however could not be completed because patient had severe epistaxis while in the procedure. He was moved back to SDU. Appears that he aspirated during all this. Discussed with Dr. Redmond Baseman, will plan for joint ENT/GI evaluation under GA and with ETT for airway protection on 5/24.  Assessment & Plan: Active Problems:   Hepatic AV fistula (HCC)   HHT (hereditary hemorrhagic telangiectasia) (HCC)   Anemia due to chronic blood loss   Cardiac cirrhosis   Chronic kidney disease   Cirrhosis (HCC)   Chronic diastolic heart failure (HCC)   Hepatic encephalopathy (HCC)   S/P placement of cardiac pacemaker   Epistaxis  Acute GI bleed  - Likely source of upper GI, clinical picture clouded by the fact that he has known recurrent epistaxis. He has known angiodysplastic lesions, seen on most recent EGD on September 2016. There is no reported esophageal varices on most recent EGD - Continue IV Protonix, continue octreotide,  ceftriaxone - Appreciate gastroenterology input, plan for EGD today - No further hematemesis overnight - Continue to closely monitor in step down, high potential for decompensation - 5/22, Events from EGD noted, patient with severe epistaxis and case needed to be aborted. Patient evaluated after, he seems to have aspirated, has coarse breath sounds. CXR with patchy left sided airspace and fluid overload is apparent.  Acute hypoxic respiratory failure - 5/22 post aspiration - received 40 IV Lasix 5/22 pm, breathing much improved this morning, on room air - WBC up, change antibiotics from Ceftriaxone to Zosyn to cover aspiration  Aspiration pneumonitis / pneumonia - cover with Zosyn  Acute on chronic recurrent epistaxis - Patient's epistaxis has recently gotten worse in the last days prior to admission.  - His epistaxis has been intermittent during his hospital stay - as above, OR evaluation 5/24 by ENT along GI  Hepatic encephalopathy - In the setting of liver cirrhosis - Ammonia elevated at 83 on admission, currently on lactulose 30 g every 6 hours, monitor mental status - Continue rifaximin as well - Mental status is clear this morning  Anemia due to acute on chronic blood loss - Related to hereditary hemorrhagic telangiectasia, chronic epistaxis - Hemoglobin 7.2 on admission, ordered 3 units packed red blood cells on admission - Continue iron supplements - Hemoglobin 9.1 this morning, continue to monitor   Liver cirrhosis - Appears decompensated with encephalopathy, elevated bilirubin, thrombocytopenia  Acute on chronic diastolic CHF - received blood products on admission, crackles last night, s/p IV Lasix - appears euvolemic this morning - Continue digoxin, hold standing diuretics due to hypotension active GI bleed  - Closely monitor  fluid status, may need to re-dose with Lasix this pm, will re-evaluate patient bedside  Hyponatremia - Mild, related to liver disease as well  as heart failure  - Resolved  Pancytopenia - Secondary to cirrhosis. Follow CBCs closely. Now WBC up  Stage IV chronic kidney disease - Creatinine appears to be at baseline,   Hereditary hemorrhagic telangiectasia (HHT)   DVT prophylaxis: SCDs Code Status: DNR. This was confirmed with wife on 5/21 Family Communication: Discussed with wife at bedside  Disposition Plan: TBD  Consultants:   ENT   Gastroenterology   Procedures:   None   Antimicrobials:  Ceftriaxone 5/21>>  Subjective: - breathing comfortable this morning, on room air. Rested well during the night, no events  Objective: Filed Vitals:   05/02/16 0442 05/02/16 0500 05/02/16 0600 05/02/16 0700  BP: 99/42 95/42 102/51 103/48  Pulse: 82 80 80 80  Temp: 97.7 F (36.5 C)   97.7 F (36.5 C)  TempSrc: Axillary   Axillary  Resp: 25 21 21 19   Height:      Weight:      SpO2: 97% 92% 96% 97%    Intake/Output Summary (Last 24 hours) at 05/02/16 1017 Last data filed at 05/02/16 0928  Gross per 24 hour  Intake    293 ml  Output    850 ml  Net   -557 ml   Filed Weights   04/29/16 2331 05/01/16 0030  Weight: 74.934 kg (165 lb 3.2 oz) 74.1 kg (163 lb 5.8 oz)    Examination: Constitutional: Week appearing male in no distress   Filed Vitals:   05/02/16 0442 05/02/16 0500 05/02/16 0600 05/02/16 0700  BP: 99/42 95/42 102/51 103/48  Pulse: 82 80 80 80  Temp: 97.7 F (36.5 C)   97.7 F (36.5 C)  TempSrc: Axillary   Axillary  Resp: 25 21 21 19   Height:      Weight:      SpO2: 97% 92% 96% 97%   Eyes: PERRL, lids and conjunctivae normal, mild scleral icterus  ENMT: red raw mucosa in bilateral nostrils  Respiratory: no wheezing, bibasilar crackles. Normal respiratory effort. No accessory muscle use.  Cardiovascular: Regular rate and rhythm, no murmurs / rubs / gallops. Trace LE edema. 2+ pedal pulses.  Abdomen: no tenderness. Bowel sounds positive.  Musculoskeletal: no clubbing / cyanosis. .    Neurologic:  nonfocal.    Data Reviewed: I have personally reviewed following labs and imaging studies  CBC:  Recent Labs Lab 04/29/16 1857 04/30/16 1318 05/01/16 0350 05/02/16 0523  WBC 3.9* 4.8 5.2 15.2*  NEUTROABS 2.8  --   --   --   HGB 7.2* 9.1* 8.9* 9.1*  HCT 23.9* 29.6* 28.5* 29.8*  MCV 89.5 86.3 86.4 87.9  PLT 61* 54* 52* 67*   Basic Metabolic Panel:  Recent Labs Lab 04/29/16 1857 04/30/16 1318 05/01/16 0350 05/02/16 0523  NA 134* 132* 136 136  K 4.4 4.0 4.1 4.6  CL 100* 101 102 101  CO2 25 23 25 23   GLUCOSE 127* 166* 116* 123*  BUN 46* 45* 43* 54*  CREATININE 2.43* 2.38* 2.19* 2.42*  CALCIUM 8.9 9.0 9.0 8.7*  MG  --  2.6*  --   --   PHOS  --  4.7*  --   --    GFR: Estimated Creatinine Clearance: 26.4 mL/min (by C-G formula based on Cr of 2.42). Liver Function Tests:  Recent Labs Lab 04/29/16 1857 04/30/16 1318 05/01/16 0350  AST 36 40  33  ALT 26 29 26   ALKPHOS 137* 137* 127*  BILITOT 2.3* 8.1* 5.5*  PROT 5.1* 5.4* 5.0*  ALBUMIN 2.7* 2.8* 2.7*   No results for input(s): LIPASE, AMYLASE in the last 168 hours.  Recent Labs Lab 04/29/16 1857 04/30/16 1318  AMMONIA 83* 60*   Coagulation Profile:  Recent Labs Lab 04/29/16 1857 05/01/16 0350  INR 1.34 1.33   Cardiac Enzymes: No results for input(s): CKTOTAL, CKMB, CKMBINDEX, TROPONINI in the last 168 hours. BNP (last 3 results)  Recent Labs  08/09/15 1621  PROBNP 188.0*   HbA1C: No results for input(s): HGBA1C in the last 72 hours. CBG: No results for input(s): GLUCAP in the last 168 hours. Lipid Profile: No results for input(s): CHOL, HDL, LDLCALC, TRIG, CHOLHDL, LDLDIRECT in the last 72 hours. Thyroid Function Tests:  Recent Labs  04/30/16 1318  TSH 5.792*   Anemia Panel: No results for input(s): VITAMINB12, FOLATE, FERRITIN, TIBC, IRON, RETICCTPCT in the last 72 hours. Urine analysis:    Component Value Date/Time   COLORURINE YELLOW 04/29/2016 2107    APPEARANCEUR CLEAR 04/29/2016 2107   LABSPEC 1.011 04/29/2016 2107   PHURINE 7.0 04/29/2016 2107   GLUCOSEU NEGATIVE 04/29/2016 2107   HGBUR NEGATIVE 04/29/2016 2107   BILIRUBINUR NEGATIVE 04/29/2016 2107   KETONESUR NEGATIVE 04/29/2016 2107   PROTEINUR NEGATIVE 04/29/2016 2107   UROBILINOGEN 0.2 08/10/2015 1700   NITRITE NEGATIVE 04/29/2016 2107   LEUKOCYTESUR NEGATIVE 04/29/2016 2107   Sepsis Labs: Invalid input(s): PROCALCITONIN, LACTICIDVEN  Recent Results (from the past 240 hour(s))  MRSA PCR Screening     Status: None   Collection Time: 05/01/16 12:30 AM  Result Value Ref Range Status   MRSA by PCR NEGATIVE NEGATIVE Final    Comment:        The GeneXpert MRSA Assay (FDA approved for NASAL specimens only), is one component of a comprehensive MRSA colonization surveillance program. It is not intended to diagnose MRSA infection nor to guide or monitor treatment for MRSA infections.       Radiology Studies: Dg Chest Port 1 View  05/01/2016  CLINICAL DATA:  Aspiration of blood from nose bleed. Hx of CHF, and pulmonary HTN. Pt is a nonsmoker. EXAM: PORTABLE CHEST 1 VIEW COMPARISON:  04/21/2016 FINDINGS: Pacer with tip at right ventricle. Numerous leads and wires project over the chest. Midline trachea. Moderate cardiomegaly. Atherosclerosis in the transverse aorta. No right and no definite left pleural effusion. No pneumothorax. Interstitial prominence is moderate. Patchy left lower and mid lung airspace disease. IMPRESSION: Cardiomegaly with pulmonary interstitial prominence, suspicious for pulmonary venous congestion. Patchy left-sided airspace disease which could represent alveolar edema or especially given the clinical history aspiration versus infection. Electronically Signed   By: Abigail Miyamoto M.D.   On: 05/01/2016 14:40     Scheduled Meds: . cefTRIAXone (ROCEPHIN)  IV  1 g Intravenous Q24H  . digoxin  0.125 mg Oral Once per day on Mon Wed Fri  . feeding supplement  (ENSURE ENLIVE)  237 mL Oral BID BM  . lactulose  30 g Oral TID  . magnesium oxide  400 mg Oral Daily  . multivitamin  1 tablet Oral QHS  . oxymetazoline  1 spray Each Nare BID  . pantoprazole (PROTONIX) IV  40 mg Intravenous Q12H  . rifaximin  550 mg Oral BID  . sodium chloride  1 spray Each Nare Q2H while awake  . sodium chloride flush  3 mL Intravenous Q12H   Continuous Infusions:  Time spent: 35 minutes  Marzetta Board, MD, PhD [facility] Pager [phone]  If 7PM-7AM, please contact night-coverage [email or web address] Password [facility] 05/02/2016, 10:17 AM

## 2016-05-02 NOTE — Progress Notes (Signed)
Daily Rounding Note  05/02/2016, 8:13 AM  LOS: 3 days   SUBJECTIVE:       No complaints.  Breathing rough for several hours post EGD but improved this morning.  Large, black BM post EGD, none since.  No nausea.  No pain.   OBJECTIVE:         Vital signs in last 24 hours:    Temp:  [97 F (36.1 C)-98.1 F (36.7 C)] 97.7 F (36.5 C) (05/23 0700) Pulse Rate:  [79-86] 80 (05/23 0700) Resp:  [13-35] 19 (05/23 0700) BP: (92-126)/(38-56) 103/48 mmHg (05/23 0700) SpO2:  [92 %-100 %] 97 % (05/23 0700) Last BM Date: 05/01/16 Filed Weights   04/29/16 2331 05/01/16 0030  Weight: 74.934 kg (165 lb 3.2 oz) 74.1 kg (163 lb 5.8 oz)   General: pleasant, comfortable.   Heart: RRR Chest: no labored breathing or cough.  Rough BS in bases.  Abdomen: soft, NT, ND.  BS hypoactive  Extremities: no CCE.  Multiple punctate purpura on trunk, limbs.  No active visible bleeding.  Neuro/Psych:  Oriented x 3.  Sleeping but easily aroused.  No asterixis.  Moves all 4 limbs.  Calm.    Intake/Output from previous day: 05/22 0701 - 05/23 0700 In: 50 [I.V.:50] Out: 850 [Urine:550; Blood:300]  Intake/Output this shift:    Lab Results:  Recent Labs  04/30/16 1318 05/01/16 0350 05/02/16 0523  WBC 4.8 5.2 15.2*  HGB 9.1* 8.9* 9.1*  HCT 29.6* 28.5* 29.8*  PLT 54* 52* 67*   BMET  Recent Labs  04/30/16 1318 05/01/16 0350 05/02/16 0523  NA 132* 136 136  K 4.0 4.1 4.6  CL 101 102 101  CO2 23 25 23   GLUCOSE 166* 116* 123*  BUN 45* 43* 54*  CREATININE 2.38* 2.19* 2.42*  CALCIUM 9.0 9.0 8.7*   LFT  Recent Labs  04/29/16 1857 04/30/16 1318 05/01/16 0350  PROT 5.1* 5.4* 5.0*  ALBUMIN 2.7* 2.8* 2.7*  AST 36 40 33  ALT 26 29 26   ALKPHOS 137* 137* 127*  BILITOT 2.3* 8.1* 5.5*   PT/INR  Recent Labs  04/29/16 1857 05/01/16 0350  LABPROT 16.7* 16.6*  INR 1.34 1.33   Hepatitis Panel No results for input(s): HEPBSAG, HCVAB,  HEPAIGM, HEPBIGM in the last 72 hours.  Studies/Results: Dg Chest Port 1 View  05/01/2016  CLINICAL DATA:  Aspiration of blood from nose bleed. Hx of CHF, and pulmonary HTN. Pt is a nonsmoker. EXAM: PORTABLE CHEST 1 VIEW COMPARISON:  04/21/2016 FINDINGS: Pacer with tip at right ventricle. Numerous leads and wires project over the chest. Midline trachea. Moderate cardiomegaly. Atherosclerosis in the transverse aorta. No right and no definite left pleural effusion. No pneumothorax. Interstitial prominence is moderate. Patchy left lower and mid lung airspace disease. IMPRESSION: Cardiomegaly with pulmonary interstitial prominence, suspicious for pulmonary venous congestion. Patchy left-sided airspace disease which could represent alveolar edema or especially given the clinical history aspiration versus infection. Electronically Signed   By: Abigail Miyamoto M.D.   On: 05/01/2016 14:40   Scheduled Meds: . cefTRIAXone (ROCEPHIN)  IV  1 g Intravenous Q24H  . digoxin  0.125 mg Oral Once per day on Mon Wed Fri  . feeding supplement (ENSURE ENLIVE)  237 mL Oral BID BM  . lactulose  30 g Oral TID  . magnesium oxide  400 mg Oral Daily  . multivitamin  1 tablet Oral QHS  . oxymetazoline  1 spray Each Nare  BID  . pantoprazole (PROTONIX) IV  40 mg Intravenous Q12H  . rifaximin  550 mg Oral BID  . sodium chloride  1 spray Each Nare Q2H while awake  . sodium chloride flush  3 mL Intravenous Q12H   Continuous Infusions:  PRN Meds:.ondansetron  ASSESMENT:   * Hematemesis in setting of epistaxis. 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 EGD 08/2015: APC ablation of bleeding gastric AVMs.  05/02/15 EGD: aborted due to epistaxis, limited views of stomach, esophagus due to red blood tracking down from his mouth. Afrin added.   * Blood loss anemia. Nadir  Hgb 7.2. S/p PRBC x 3. Hgb 6.3 on 5/12: inpt transfusion x 2. Feraheme infusions 11/26/15, 12/03/15, 03/06/16, 03/13/16.   *  Likely aspiration PNA.  Day 3 Rocephin. Breathing much improved.   * Thrombocytopenia.   * Stage 4 CKD.   * Hx HE. On chronic Lactulose and Rifaximin.     PLAN   *  Switch to his usual daily oral Protonix.   *  Dr Redmond Baseman, ENT, and Dr Ardis Hughs plan dual eval under anesthesia on 5/24 if his clinical status allows.  *  Pt NPO except sips, chips.  Will allow soft diet today, NPO post midnight. Orders placed in OR depot.  Endo staff aware of potential case *  CBC in AM.    Azucena Freed  05/02/2016, 8:13 AM Pager: [phone]    [facility] MD note:  I personally examined the patient, reviewed the data and agree with the assessment and plan described above.  Breathing much more comfortably this morning.  He understands the plan for ENT evaluation, EGD in OR.  Looking at tomorrow but several schedules to coordinate.     Owens Loffler, MD [facility] Pager [phone]

## 2016-05-02 NOTE — Anesthesia Preprocedure Evaluation (Signed)
Anesthesia Evaluation  Patient identified by MRN, date of birth, ID band Patient awake    Reviewed: Allergy & Precautions, NPO status , Patient's Chart, lab work & pertinent test results  Airway Mallampati: II  TM Distance: >3 FB Neck ROM: Full    Dental   Pulmonary    Pulmonary exam normal        Cardiovascular +CHF  Normal cardiovascular exam+ pacemaker      Neuro/Psych    GI/Hepatic (+) Cirrhosis       ,   Endo/Other    Renal/GU Renal InsufficiencyRenal disease     Musculoskeletal   Abdominal   Peds  Hematology   Anesthesia Other Findings   Reproductive/Obstetrics                             Anesthesia Physical Anesthesia Plan  ASA: III  Anesthesia Plan: MAC   Post-op Pain Management:    Induction: Intravenous  Airway Management Planned: Natural Airway  Additional Equipment:   Intra-op Plan:   Post-operative Plan:   Informed Consent: I have reviewed the patients History and Physical, chart, labs and discussed the procedure including the risks, benefits and alternatives for the proposed anesthesia with the patient or authorized representative who has indicated his/her understanding and acceptance.     Plan Discussed with: CRNA and Surgeon  Anesthesia Plan Comments:         Anesthesia Quick Evaluation

## 2016-05-03 ENCOUNTER — Encounter (HOSPITAL_COMMUNITY)
Admission: EM | Disposition: A | Discharge status: Home Health | Payer: Self-pay | Source: Home / Self Care | Attending: Internal Medicine

## 2016-05-03 ENCOUNTER — Inpatient Hospital Stay (HOSPITAL_COMMUNITY): Payer: Medicare Other | Admitting: Certified Registered"

## 2016-05-03 DIAGNOSIS — K761 Chronic passive congestion of liver: Secondary | ICD-10-CM

## 2016-05-03 DIAGNOSIS — D6489 Other specified anemias: Secondary | ICD-10-CM | POA: Insufficient documentation

## 2016-05-03 DIAGNOSIS — J690 Pneumonitis due to inhalation of food and vomit: Secondary | ICD-10-CM

## 2016-05-03 DIAGNOSIS — IMO0002 Reserved for concepts with insufficient information to code with codable children: Secondary | ICD-10-CM | POA: Insufficient documentation

## 2016-05-03 DIAGNOSIS — D500 Iron deficiency anemia secondary to blood loss (chronic): Secondary | ICD-10-CM

## 2016-05-03 DIAGNOSIS — R040 Epistaxis: Secondary | ICD-10-CM

## 2016-05-03 DIAGNOSIS — R0989 Other specified symptoms and signs involving the circulatory and respiratory systems: Secondary | ICD-10-CM

## 2016-05-03 HISTORY — PX: NASAL ENDOSCOPY WITH EPISTAXIS CONTROL: SHX5664

## 2016-05-03 HISTORY — PX: ESOPHAGOGASTRODUODENOSCOPY: SHX5428

## 2016-05-03 LAB — BASIC METABOLIC PANEL
ANION GAP: 8 (ref 5–15)
BUN: 55 mg/dL — ABNORMAL HIGH (ref 6–20)
CHLORIDE: 101 mmol/L (ref 101–111)
CO2: 26 mmol/L (ref 22–32)
Calcium: 9 mg/dL (ref 8.9–10.3)
Creatinine, Ser: 2.26 mg/dL — ABNORMAL HIGH (ref 0.61–1.24)
GFR calc non Af Amer: 26 mL/min — ABNORMAL LOW (ref >60)
GFR, EST AFRICAN AMERICAN: 30 mL/min — AB (ref >60)
GLUCOSE: 147 mg/dL — AB (ref 65–99)
Potassium: 4.1 mmol/L (ref 3.5–5.1)
Sodium: 135 mmol/L (ref 135–145)

## 2016-05-03 LAB — CBC
HEMATOCRIT: 28.8 % — AB (ref 39.0–52.0)
Hemoglobin: 8.7 g/dL — ABNORMAL LOW (ref 13.0–17.0)
MCH: 26.9 pg (ref 26.0–34.0)
MCHC: 30.2 g/dL (ref 30.0–36.0)
MCV: 89.2 fL (ref 78.0–100.0)
Platelets: 70 10*3/uL — ABNORMAL LOW (ref 150–400)
RBC: 3.23 MIL/uL — AB (ref 4.22–5.81)
RDW: 19.2 % — ABNORMAL HIGH (ref 11.5–15.5)
WBC: 6.7 10*3/uL (ref 4.0–10.5)

## 2016-05-03 LAB — PREPARE RBC (CROSSMATCH)

## 2016-05-03 SURGERY — CONTROL OF EPISTAXIS, ENDOSCOPIC
Anesthesia: General

## 2016-05-03 SURGERY — EGD (ESOPHAGOGASTRODUODENOSCOPY)
Anesthesia: Monitor Anesthesia Care

## 2016-05-03 MED ORDER — PROPOFOL 10 MG/ML IV BOLUS
INTRAVENOUS | Status: AC
  Filled 2016-05-03: qty 20

## 2016-05-03 MED ORDER — LIDOCAINE-EPINEPHRINE 1 %-1:100000 IJ SOLN
INTRAMUSCULAR | Status: AC
  Filled 2016-05-03: qty 1

## 2016-05-03 MED ORDER — ROCURONIUM BROMIDE 100 MG/10ML IV SOLN
INTRAVENOUS | Status: DC | PRN
  Administered 2016-05-03: 30 mg via INTRAVENOUS

## 2016-05-03 MED ORDER — PROPOFOL 10 MG/ML IV BOLUS
INTRAVENOUS | Status: DC | PRN
  Administered 2016-05-03: 100 mg via INTRAVENOUS

## 2016-05-03 MED ORDER — FENTANYL CITRATE (PF) 100 MCG/2ML IJ SOLN
INTRAMUSCULAR | Status: DC | PRN
  Administered 2016-05-03: 150 ug via INTRAVENOUS
  Administered 2016-05-03: 50 ug via INTRAVENOUS

## 2016-05-03 MED ORDER — SODIUM CHLORIDE 0.9 % IV SOLN
Freq: Once | INTRAVENOUS | Status: AC
  Administered 2016-05-03: 14:00:00 via INTRAVENOUS

## 2016-05-03 MED ORDER — OXYMETAZOLINE HCL 0.05 % NA SOLN
NASAL | Status: AC
  Filled 2016-05-03: qty 15

## 2016-05-03 MED ORDER — ALBUMIN HUMAN 5 % IV SOLN
INTRAVENOUS | Status: DC | PRN
  Administered 2016-05-03: 15:00:00 via INTRAVENOUS

## 2016-05-03 MED ORDER — PHENYLEPHRINE HCL 10 MG/ML IJ SOLN
INTRAMUSCULAR | Status: AC
  Filled 2016-05-03: qty 1

## 2016-05-03 MED ORDER — FENTANYL CITRATE (PF) 250 MCG/5ML IJ SOLN
INTRAMUSCULAR | Status: AC
  Filled 2016-05-03: qty 5

## 2016-05-03 MED ORDER — EPHEDRINE SULFATE 50 MG/ML IJ SOLN
INTRAMUSCULAR | Status: DC | PRN
  Administered 2016-05-03 (×3): 10 mg via INTRAVENOUS

## 2016-05-03 MED ORDER — SUCCINYLCHOLINE CHLORIDE 20 MG/ML IJ SOLN
INTRAMUSCULAR | Status: DC | PRN
  Administered 2016-05-03: 100 mg via INTRAVENOUS

## 2016-05-03 MED ORDER — LIDOCAINE 2% (20 MG/ML) 5 ML SYRINGE
INTRAMUSCULAR | Status: AC
  Filled 2016-05-03: qty 5

## 2016-05-03 MED ORDER — GLYCOPYRROLATE 0.2 MG/ML IJ SOLN
INTRAMUSCULAR | Status: DC | PRN
  Administered 2016-05-03: .4 mg via INTRAVENOUS

## 2016-05-03 MED ORDER — LIDOCAINE-EPINEPHRINE 1 %-1:100000 IJ SOLN
INTRAMUSCULAR | Status: DC | PRN
  Administered 2016-05-03: 1 mL

## 2016-05-03 MED ORDER — SODIUM CHLORIDE 0.9 % IV SOLN
INTRAVENOUS | Status: DC
  Administered 2016-05-03: 14:00:00 via INTRAVENOUS

## 2016-05-03 MED ORDER — OXYMETAZOLINE HCL 0.05 % NA SOLN
NASAL | Status: DC | PRN
  Administered 2016-05-03: 1 via TOPICAL

## 2016-05-03 MED ORDER — NEOSTIGMINE METHYLSULFATE 10 MG/10ML IV SOLN
INTRAVENOUS | Status: DC | PRN
  Administered 2016-05-03: 3 mg via INTRAVENOUS

## 2016-05-03 MED ORDER — 0.9 % SODIUM CHLORIDE (POUR BTL) OPTIME
TOPICAL | Status: DC | PRN
  Administered 2016-05-03: 1000 mL

## 2016-05-03 MED ORDER — LIDOCAINE HCL (CARDIAC) 20 MG/ML IV SOLN
INTRAVENOUS | Status: DC | PRN
  Administered 2016-05-03: 80 mg via INTRAVENOUS

## 2016-05-03 MED ORDER — CETYLPYRIDINIUM CHLORIDE 0.05 % MT LIQD
7.0000 mL | Freq: Two times a day (BID) | OROMUCOSAL | Status: DC
  Administered 2016-05-03 – 2016-05-05 (×2): 7 mL via OROMUCOSAL

## 2016-05-03 MED ORDER — SODIUM CHLORIDE 0.9 % IV BOLUS (SEPSIS)
250.0000 mL | Freq: Once | INTRAVENOUS | Status: AC
  Administered 2016-05-03: 250 mL via INTRAVENOUS

## 2016-05-03 MED ORDER — PHENYLEPHRINE HCL 10 MG/ML IJ SOLN
INTRAMUSCULAR | Status: DC | PRN
  Administered 2016-05-03: 240 ug via INTRAVENOUS
  Administered 2016-05-03: 160 ug via INTRAVENOUS

## 2016-05-03 SURGICAL SUPPLY — 26 items
CANISTER SUCTION 2500CC (MISCELLANEOUS) ×3 IMPLANT
COAGULATOR SUCT 8FR VV (MISCELLANEOUS) ×3 IMPLANT
COVER MAYO STAND STRL (DRAPES) ×3 IMPLANT
COVER TABLE BACK 60X90 (DRAPES) ×3 IMPLANT
DECANTER SPIKE VIAL GLASS SM (MISCELLANEOUS) ×3 IMPLANT
DRAPE PROXIMA HALF (DRAPES) ×3 IMPLANT
GAUZE SPONGE 2X2 8PLY STRL LF (GAUZE/BANDAGES/DRESSINGS) IMPLANT
GAUZE SPONGE 4X4 16PLY XRAY LF (GAUZE/BANDAGES/DRESSINGS) ×3 IMPLANT
GLOVE BIOGEL PI IND STRL 7.0 (GLOVE) ×3 IMPLANT
GLOVE BIOGEL PI INDICATOR 7.0 (GLOVE) ×6
GLOVE ECLIPSE 7.5 STRL STRAW (GLOVE) ×3 IMPLANT
GOWN STRL REUS W/ TWL LRG LVL3 (GOWN DISPOSABLE) ×2 IMPLANT
GOWN STRL REUS W/TWL LRG LVL3 (GOWN DISPOSABLE) ×4
KIT BASIN OR (CUSTOM PROCEDURE TRAY) ×3 IMPLANT
KIT ROOM TURNOVER OR (KITS) ×3 IMPLANT
NEEDLE HYPO 25GX1X1/2 BEV (NEEDLE) ×3 IMPLANT
NS IRRIG 1000ML POUR BTL (IV SOLUTION) ×3 IMPLANT
PAD ARMBOARD 7.5X6 YLW CONV (MISCELLANEOUS) ×6 IMPLANT
PATTIES SURGICAL .5 X3 (DISPOSABLE) ×3 IMPLANT
SOLUTION ANTI FOG 6CC (MISCELLANEOUS) ×3 IMPLANT
SPLINT NASAL THERMO PLAST (MISCELLANEOUS) IMPLANT
SPONGE GAUZE 2X2 STER 10/PKG (GAUZE/BANDAGES/DRESSINGS)
SYR CONTROL 10ML LL (SYRINGE) ×3 IMPLANT
TOWEL OR 17X24 6PK STRL BLUE (TOWEL DISPOSABLE) ×6 IMPLANT
TUBE CONNECTING 12'X1/4 (SUCTIONS) ×1
TUBE CONNECTING 12X1/4 (SUCTIONS) ×2 IMPLANT

## 2016-05-03 NOTE — Brief Op Note (Signed)
04/29/2016 - 05/03/2016  3:43 PM  PATIENT:  Hunter [name]  81 y.o. male  PRE-OPERATIVE DIAGNOSIS:  Nosebleeds  POST-OPERATIVE DIAGNOSIS:  Nosebleeds  PROCEDURE:  Procedure(s) with comments: NASAL ENDOSCOPY WITH EPISTAXIS CONTROL (N/A) - Dr Oretha Caprice to follow with upper endoscopy ESOPHAGOGASTRODUODENOSCOPY (EGD) (N/A)  SURGEON:  Surgeon(s) and Role: Panel 1:    * Melida Quitter, MD - Primary  Panel 2:    * Milus Banister, MD - Primary  PHYSICIAN ASSISTANT:   ASSISTANTS: none   ANESTHESIA:   general  EBL:  Total I/O In: [id number] [I.V.:3; Blood:335; IV Piggyback:500] Out: 400 [Blood:400]  BLOOD ADMINISTERED:1 pack CC PRBC  DRAINS: none   LOCAL MEDICATIONS USED:  NONE  SPECIMEN:  No Specimen  DISPOSITION OF SPECIMEN:  N/A  COUNTS:  YES  TOURNIQUET:  * No tourniquets in log *  DICTATION: .Other Dictation: Dictation Number [id number]  PLAN OF CARE: Return to hospital room  PATIENT DISPOSITION:  PACU - hemodynamically stable.   Delay start of Pharmacological VTE agent (>24hrs) due to surgical blood loss or risk of bleeding: yes

## 2016-05-03 NOTE — Op Note (Signed)
[facility] Patient Name: Hunter [name] Procedure Date : 05/03/2016 MRN: [id number] Attending MD: Milus Banister , MD Date of Birth: May 18, 1935 CSN: [id number] Age: 81 Admit Type: Inpatient Procedure:                Upper GI endoscopy Indications:              Hematemesis, also epistaxis; known HHT Providers:                Milus Banister, MD, Corliss Parish, Technician Referring MD:              Medicines:                General Anesthesia in the OR, combined with ENT case Complications:            No immediate complications. Estimated blood loss:                            Minimal. Estimated Blood Loss:     Estimated blood loss was minimal. Procedure:                Pre-Anesthesia Assessment:                           - Prior to the procedure, a History and Physical                            was performed, and patient medications and                            allergies were reviewed. The patient's tolerance of                            previous anesthesia was also reviewed. The risks                            and benefits of the procedure and the sedation                            options and risks were discussed with the patient.                            All questions were answered, and informed consent                            was obtained. Prior Anticoagulants: The patient has                            taken no previous anticoagulant or antiplatelet                            agents. ASA Grade Assessment: III - A patient with                            severe systemic disease. After reviewing the risks  and benefits, the patient was deemed in                            satisfactory condition to undergo the procedure.                           After obtaining informed consent, the endoscope was                            passed under direct vision. Throughout the                            procedure, the patient's blood  pressure, pulse, and                            oxygen saturations were monitored continuously. The                            [id number]) scope was introduced through the                            mouth, and advanced to the second part of duodenum.                            The upper GI endoscopy was accomplished without                            difficulty. The patient tolerated the procedure                            well. Scope In: Scope Out: Findings:      Following ENT evaluation, the EGD was performed      1. The esophagus was normal.      2. Multiple bleeding angiodysplastic lesions were found in the gastric       body. These measured 3-22mm across. One was bleeding, that one and two       others bled during treatment with APC. APC was successful for hemastasis       and lesion destruction.      3. The exam was otherwise without abnormality. Impression:               - Normal esophagus.                           - Multiple bleeding angiodysplastic lesions in the                            stomach. Treated with argon plasma coagulation                            (APC).                           - The examination was otherwise normal.                           -  No specimens collected. Moderate Sedation:      none Recommendation:           - Return patient to hospital ward for observation.                           - Resume previous diet.                           - Continue present medications. Procedure Code(s):        --- Professional ---                           [phone], Esophagogastroduodenoscopy, flexible,                            transoral; with control of bleeding, any method Diagnosis Code(s):        --- Professional ---                           [id number], Angiodysplasia of stomach and duodenum                            with bleeding                           K92.0, Hematemesis CPT copyright 2016 [employer]. All rights reserved. The  codes documented in this report are preliminary and upon coder review may  be revised to meet current compliance requirements. Milus Banister, MD 05/03/2016 4:02:39 PM This report has been signed electronically. Number of Addenda: 0

## 2016-05-03 NOTE — Progress Notes (Addendum)
PROGRESS NOTE  Hunter [name] [id number] DOB: 1935-09-26 DOA: 04/29/2016 PCP: Penni Homans, MD   LOS: 4 days   Brief Narrative: 81 y.o. male with medical history significant of Hepatic cirrhosis with history of hereditary hemorrhagic telangiectasia with chronic anemia due to chronic blood loss, Chronic diastolic heart failure, sp pacemaker, recurrent hepatic encephalopathy chronic kidney disease recurrent epistaxis, presented to the emergency room on 5/20 with unrelenting nosebleed resulting in vomiting blood.   Interval history  Patient admitted to the hospital as above, ENT was consulted and evaluated patient on 5/21, no active bleeding was seen however old blood clots which were removed. On 5/21 in the evening, patient started vomiting bright red blood without active epistaxis, likely to represent a GI bleed in addition to his nosebleeds. Gastroenterology was consulted, patient was started on Protonix, octreotide, ceftriaxone and moved to step down. He underwent an EGD on 5/22 however could not be completed because patient had severe epistaxis while in the procedure. He was moved back to SDU. Appears that he aspirated during all this. Discussed with Dr. Redmond Baseman, will plan for joint ENT/GI evaluation under GA and with ETT for airway protection on 5/24.  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 He has known angiodysplastic lesions, seen on most recent EGD on September 2016. There is no reported esophageal varices on most recent EGD Currently on IV Protonix, started on Zosyn for possible aspiration -EGD 5/22 aborted due to epistaxis,EGD and ENT eval under anesthesia in OR today ENT requested PCCM assistance in case of difficult extubation - Continue to closely monitor in step down, high potential for decompensation Patient seems to have aspirated on 5/22, has coarse breath sounds. CXR with patchy left sided airspace and fluid overload is apparent.  Acute blood loss anemia Nadir Hgb 7.2. S/p PRBC x 3 Transfuse 1 more unit today  Acute hypoxic respiratory failure - 5/22 post aspiration - received 40 IV Lasix 5/22 pm, breathing much improved this morning, on room air - WBC up, change antibiotics from Ceftriaxone to Zosyn to cover aspiration  Aspiration pneumonitis / pneumonia - cover with Zosyn  Acute on chronic recurrent epistaxis - Patient's epistaxis has recently gotten worse in the last days prior to admission.  - His epistaxis has been intermittent during his hospital stay - as above, OR evaluation 5/24 by ENT along GI  Hepatic encephalopathy - In the setting of liver cirrhosis - Ammonia elevated at 83 on admission, currently on lactulose 30 g every 6 hours, monitor mental status - Continue rifaximin as well - Mental status is clear this morning  Anemia due to acute on chronic blood loss - Related to hereditary hemorrhagic telangiectasia, chronic epistaxis - Hemoglobin 7.2 on admission, ordered 3 units packed red blood cells on admission - Continue iron supplements - Hemoglobin 9.1 this morning, continue to monitor   Liver cirrhosis - Appears decompensated  with encephalopathy, elevated bilirubin, thrombocytopenia  Acute on chronic diastolic CHF - received blood products on admission, crackles last night, s/p IV  Lasix - appears euvolemic this morning - Continue digoxin, hold standing diuretics due to hypotension active GI bleed  - Closely monitor fluid status, may need to re-dose with Lasix this pm, will re-evaluate patient bedside  Hyponatremia - Mild, related to liver disease as well as heart failure  - Resolved  Pancytopenia - Secondary to cirrhosis. Follow CBCs closely. Now WBC up  Stage IV chronic kidney disease - Creatinine appears to be at baseline,   Hereditary hemorrhagic telangiectasia (HHT)   DVT prophylaxis: SCDs Code Status: DNR. This was confirmed with wife on 5/21 Family Communication: Discussed with wife at bedside  Disposition Plan: Anticipate procedure by GI and ENT this afternoon  Consultants:   ENT   Gastroenterology   PCCM  Procedures:   None   Antimicrobials:  Ceftriaxone 5/21>>  Subjective: Resting comfortably, currently on room air, chronically ill-appearing, no further epistaxis, wife is by the bedside  Objective: Filed Vitals:   05/03/16 0800 05/03/16 0900 05/03/16 0948 05/03/16 1000  BP: 96/36 89/44  108/55  Pulse: 79 80 80 80  Temp: 97.8 F (36.6 C)     TempSrc: Oral     Resp: 19 19  15   Height:      Weight:      SpO2: 94% 93%  96%    Intake/Output Summary (Last 24 hours) at 05/03/16 1211 Last data filed at 05/03/16 1030  Gross per 24 hour  Intake   1126 ml  Output    950 ml  Net    176 ml   Filed Weights   04/29/16 2331 05/01/16 0030  Weight: 74.934 kg (165 lb 3.2 oz) 74.1 kg (163 lb 5.8 oz)    Examination: Constitutional: Week appearing male in no distress   Filed Vitals:   05/03/16 0800 05/03/16 0900 05/03/16 0948 05/03/16 1000  BP: 96/36 89/44  108/55  Pulse: 79 80 80 80  Temp: 97.8 F (36.6 C)     TempSrc: Oral     Resp: 19 19  15   Height:      Weight:      SpO2: 94% 93%  96%   Eyes: PERRL, lids and conjunctivae normal, mild scleral icterus  ENMT: red raw mucosa in bilateral nostrils  Respiratory: no  wheezing, bibasilar crackles. Normal respiratory effort. No accessory muscle use.  Cardiovascular: Regular rate and rhythm, no murmurs / rubs / gallops. Trace LE edema. 2+ pedal pulses.  Abdomen: no tenderness. Bowel sounds positive.  Musculoskeletal: no clubbing / cyanosis. .  Neurologic:  nonfocal.    Data Reviewed: I have personally reviewed following labs and imaging studies  CBC:  Recent Labs Lab 04/29/16 1857 04/30/16 1318 05/01/16 0350 05/02/16 0523 05/03/16 0345  WBC 3.9* 4.8 5.2 15.2* 6.7  NEUTROABS 2.8  --   --   --   --   HGB 7.2* 9.1* 8.9* 9.1* 8.7*  HCT 23.9* 29.6* 28.5* 29.8* 28.8*  MCV 89.5 86.3 86.4 87.9 89.2  PLT 61* 54* 52* 67* 70*   Basic Metabolic Panel:  Recent Labs Lab 04/29/16 1857 04/30/16 1318 05/01/16 0350 05/02/16 0523 05/03/16 0345  NA 134* 132* 136 136 135  K 4.4 4.0 4.1 4.6 4.1  CL 100* 101 102 101 101  CO2 25 23 25 23 26   GLUCOSE 127* 166* 116* 123* 147*  BUN 46* 45* 43* 54* 55*  CREATININE 2.43* 2.38*  2.19* 2.42* 2.26*  CALCIUM 8.9 9.0 9.0 8.7* 9.0  MG  --  2.6*  --   --   --   PHOS  --  4.7*  --   --   --    GFR: Estimated Creatinine Clearance: 28.2 mL/min (by C-G formula based on Cr of 2.26). Liver Function Tests:  Recent Labs Lab 04/29/16 1857 04/30/16 1318 05/01/16 0350  AST 36 40 33  ALT 26 29 26   ALKPHOS 137* 137* 127*  BILITOT 2.3* 8.1* 5.5*  PROT 5.1* 5.4* 5.0*  ALBUMIN 2.7* 2.8* 2.7*   No results for input(s): LIPASE, AMYLASE in the last 168 hours.  Recent Labs Lab 04/29/16 1857 04/30/16 1318  AMMONIA 83* 60*   Coagulation Profile:  Recent Labs Lab 04/29/16 1857 05/01/16 0350  INR 1.34 1.33   Cardiac Enzymes: No results for input(s): CKTOTAL, CKMB, CKMBINDEX, TROPONINI in the last 168 hours. BNP (last 3 results)  Recent Labs  08/09/15 1621  PROBNP 188.0*   HbA1C: No results for input(s): HGBA1C in the last 72 hours. CBG: No results for input(s): GLUCAP in the last 168 hours. Lipid  Profile: No results for input(s): CHOL, HDL, LDLCALC, TRIG, CHOLHDL, LDLDIRECT in the last 72 hours. Thyroid Function Tests:  Recent Labs  04/30/16 1318  TSH 5.792*   Anemia Panel: No results for input(s): VITAMINB12, FOLATE, FERRITIN, TIBC, IRON, RETICCTPCT in the last 72 hours. Urine analysis:    Component Value Date/Time   COLORURINE YELLOW 04/29/2016 2107   APPEARANCEUR CLEAR 04/29/2016 2107   LABSPEC 1.011 04/29/2016 2107   PHURINE 7.0 04/29/2016 2107   GLUCOSEU NEGATIVE 04/29/2016 2107   HGBUR NEGATIVE 04/29/2016 2107   BILIRUBINUR NEGATIVE 04/29/2016 2107   KETONESUR NEGATIVE 04/29/2016 2107   PROTEINUR NEGATIVE 04/29/2016 2107   UROBILINOGEN 0.2 08/10/2015 1700   NITRITE NEGATIVE 04/29/2016 2107   LEUKOCYTESUR NEGATIVE 04/29/2016 2107   Sepsis Labs: Invalid input(s): PROCALCITONIN, LACTICIDVEN  Recent Results (from the past 240 hour(s))  MRSA PCR Screening     Status: None   Collection Time: 05/01/16 12:30 AM  Result Value Ref Range Status   MRSA by PCR NEGATIVE NEGATIVE Final    Comment:        The GeneXpert MRSA Assay (FDA approved for NASAL specimens only), is one component of a comprehensive MRSA colonization surveillance program. It is not intended to diagnose MRSA infection nor to guide or monitor treatment for MRSA infections.       Radiology Studies: Dg Chest Port 1 View  05/01/2016  CLINICAL DATA:  Aspiration of blood from nose bleed. Hx of CHF, and pulmonary HTN. Pt is a nonsmoker. EXAM: PORTABLE CHEST 1 VIEW COMPARISON:  04/21/2016 FINDINGS: Pacer with tip at right ventricle. Numerous leads and wires project over the chest. Midline trachea. Moderate cardiomegaly. Atherosclerosis in the transverse aorta. No right and no definite left pleural effusion. No pneumothorax. Interstitial prominence is moderate. Patchy left lower and mid lung airspace disease. IMPRESSION: Cardiomegaly with pulmonary interstitial prominence, suspicious for pulmonary venous  congestion. Patchy left-sided airspace disease which could represent alveolar edema or especially given the clinical history aspiration versus infection. Electronically Signed   By: Abigail Miyamoto M.D.   On: 05/01/2016 14:40     Scheduled Meds: . digoxin  0.125 mg Oral Once per day on Mon Wed Fri  . feeding supplement (ENSURE ENLIVE)  237 mL Oral BID BM  . lactulose  30 g Oral TID  . magnesium oxide  400 mg Oral Daily  .  multivitamin  1 tablet Oral QHS  . oxymetazoline  1 spray Each Nare BID  . pantoprazole (PROTONIX) IV  40 mg Intravenous Q12H  . piperacillin-tazobactam (ZOSYN)  IV  3.375 g Intravenous Q8H  . rifaximin  550 mg Oral BID  . sodium chloride  1 spray Each Nare Q2H while awake  . sodium chloride flush  3 mL Intravenous Q12H   Continuous Infusions:    Time spent: 35 minutes Reyne Dumas , MD,  [facility] Pager [phone]  If 7PM-7AM, please contact night-coverage [email or web address] Password [facility] 05/03/2016, 12:11 PM

## 2016-05-03 NOTE — Progress Notes (Signed)
Daily Rounding Note  05/03/2016, 10:06 AM  LOS: 4 days   SUBJECTIVE:       ~ 4 BMs yesterday; still dark but not melenic.  No epistaxis.  Got IVF bolus this AM for BP 89/44.  Pulse consistently ~ 80.  Pt feels ok, no complaints  OBJECTIVE:         Vital signs in last 24 hours:    Temp:  [97.6 F (36.4 C)-98.4 F (36.9 C)] 97.8 F (36.6 C) (05/24 0800) Pulse Rate:  [78-82] 80 (05/24 0948) Resp:  [15-22] 19 (05/24 0900) BP: (89-107)/(36-56) 89/44 mmHg (05/24 0900) SpO2:  [90 %-100 %] 93 % (05/24 0900) Last BM Date: 05/03/16 Filed Weights   04/29/16 2331 05/01/16 0030  Weight: 74.934 kg (165 lb 3.2 oz) 74.1 kg (163 lb 5.8 oz)   General: pleasant, frail, chronically ill looking   Heart: RRR Chest: clear bil.  No labored breathing, no cough, no wet vocal quality.  Abdomen: soft, NT.  Active BS, ND.     Extremities: no CCE.  Extensive purpura.  Neuro/Psych:  Pleasant, alert, cooperative.  Follows commands.  Slight asterixis.    Intake/Output from previous day: 05/23 0701 - 05/24 0700 In: 1116 [P.O.:960; I.V.:6; IV Piggyback:150] Out: 950 [Stool:950]  Intake/Output this shift: Total I/O In: 3 [I.V.:3] Out: -   Lab Results:  Recent Labs  05/01/16 0350 05/02/16 0523 05/03/16 0345  WBC 5.2 15.2* 6.7  HGB 8.9* 9.1* 8.7*  HCT 28.5* 29.8* 28.8*  PLT 52* 67* 70*   BMET  Recent Labs  05/01/16 0350 05/02/16 0523 05/03/16 0345  NA 136 136 135  K 4.1 4.6 4.1  CL 102 101 101  CO2 25 23 26   GLUCOSE 116* 123* 147*  BUN 43* 54* 55*  CREATININE 2.19* 2.42* 2.26*  CALCIUM 9.0 8.7* 9.0   LFT  Recent Labs  04/30/16 1318 05/01/16 0350  PROT 5.4* 5.0*  ALBUMIN 2.8* 2.7*  AST 40 33  ALT 29 26  ALKPHOS 137* 127*  BILITOT 8.1* 5.5*   PT/INR  Recent Labs  05/01/16 0350  LABPROT 16.6*  INR 1.33   Hepatitis Panel No results for input(s): HEPBSAG, HCVAB, HEPAIGM, HEPBIGM in the last 72  hours.  Studies/Results: Dg Chest Port 1 View  05/01/2016  CLINICAL DATA:  Aspiration of blood from nose bleed. Hx of CHF, and pulmonary HTN. Pt is a nonsmoker. EXAM: PORTABLE CHEST 1 VIEW COMPARISON:  04/21/2016 FINDINGS: Pacer with tip at right ventricle. Numerous leads and wires project over the chest. Midline trachea. Moderate cardiomegaly. Atherosclerosis in the transverse aorta. No right and no definite left pleural effusion. No pneumothorax. Interstitial prominence is moderate. Patchy left lower and mid lung airspace disease. IMPRESSION: Cardiomegaly with pulmonary interstitial prominence, suspicious for pulmonary venous congestion. Patchy left-sided airspace disease which could represent alveolar edema or especially given the clinical history aspiration versus infection. Electronically Signed   By: Abigail Miyamoto M.D.   On: 05/01/2016 14:40   Scheduled Meds: . digoxin  0.125 mg Oral Once per day on Mon Wed Fri  . feeding supplement (ENSURE ENLIVE)  237 mL Oral BID BM  . lactulose  30 g Oral TID  . magnesium oxide  400 mg Oral Daily  . multivitamin  1 tablet Oral QHS  . oxymetazoline  1 spray Each Nare BID  . pantoprazole (PROTONIX) IV  40 mg Intravenous Q12H  . piperacillin-tazobactam (ZOSYN)  IV  3.375 g Intravenous Q8H  .  rifaximin  550 mg Oral BID  . sodium chloride  1 spray Each Nare Q2H while awake  . sodium chloride flush  3 mL Intravenous Q12H   Continuous Infusions:  PRN Meds:.ondansetron  ASSESMENT:   * Hematemesis in setting of epistaxis. 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 EGD 08/2015: APC ablation of bleeding gastric AVMs.  05/02/15 EGD: aborted due to epistaxis, limited views of stomach and esophagus due to red blood tracking down from his mouth. Afrin added and no overt epistaxis since 5/22. .   * Blood  loss anemia. Nadir Hgb 7.2. S/p PRBC x 3. Hgb 6.3 on 5/12: inpt transfusion x 2. Feraheme infusions 11/26/15, 12/03/15, 03/06/16, 03/13/16.   * Likely aspiration PNA. Day 3 Rocephin. Breathing much improved.   * Thrombocytopenia.   * Stage 4 CKD.   * Hx HE. On chronic Lactulose and Rifaximin. 4 BMs yesterday.  Some asterixis on exam.     PLAN   *  EGD and ENT eval under anesthesia in OR today.    *  AM CBC.      Azucena Freed  05/03/2016, 10:06 AM Pager: [phone]

## 2016-05-03 NOTE — Care Management Important Message (Signed)
Important Message  Patient Details  Name: Parth Kolar MRN: [id number] Date of Birth: 04/08/1935   Medicare Important Message Given:  Yes    [name] 05/03/2016, 10:43 AM

## 2016-05-03 NOTE — Anesthesia Procedure Notes (Signed)
Procedure Name: Intubation Date/Time: 05/03/2016 2:44 PM Performed by: Lance Coon Pre-anesthesia Checklist: Patient identified, Timeout performed, Emergency Drugs available, Suction available and Patient being monitored Patient Re-evaluated:Patient Re-evaluated prior to inductionOxygen Delivery Method: Circle system utilized Preoxygenation: Pre-oxygenation with 100% oxygen Intubation Type: IV induction, Cricoid Pressure applied and Rapid sequence Laryngoscope Size: Miller and 2 Grade View: Grade II Tube type: Oral Tube size: 7.5 mm Number of attempts: 1 Airway Equipment and Method: Stylet Placement Confirmation: ETT inserted through vocal cords under direct vision,  positive ETCO2 and breath sounds checked- equal and bilateral Secured at: 21 cm Tube secured with: Tape Dental Injury: Teeth and Oropharynx as per pre-operative assessment

## 2016-05-03 NOTE — Op Note (Signed)
NAMECLINTIN, VANDYKEN NO.:  [id number]  MEDICAL RECORD NO.:  [id number]  LOCATION:  2C13C                        FACILITY:  [facility]  PHYSICIAN:  Onnie Graham, MD     DATE OF BIRTH:  [birth year]  DATE OF PROCEDURE:  05/03/2016 DATE OF DISCHARGE:                              OPERATIVE REPORT   PREOPERATIVE DIAGNOSES: 1. Epistaxis. 2. Hematemesis. 3. Hereditary hemorrhagic telangiectasia.  POSTOPERATIVE DIAGNOSES: 1. Epistaxis. 2. Hematemesis. 3. Hereditary hemorrhagic telangiectasia.  PROCEDURE:  Nasal endoscopy with control of epistaxis.  SURGEON:  Leane Para. Redmond Baseman, MD  ANESTHESIA:  General endotracheal anesthesia.  COMPLICATIONS:  None.  INDICATION:  The patient is a 81 year old male with hereditary hemorrhagic telangiectasia having required countless numbers of nasal cauteries over the years to control nose bleeds.  He also has a history of gastric AVMs that have required treatment.  He presented to the hospital 4 days ago because of hematemesis and was found to be anemic and was admitted.  He continued to have hematemesis at times as well as nose bleeds.  An effort at upper endoscopy by Dr. Ardis Hughs from GI was tried 2 days ago, but he developed nose bleeding during the procedure and had to be aborted.  He presents to the operating room today for combined nasal endoscopy with control of epistaxis as well as an upper endoscopy by Dr. Ardis Hughs.  FINDINGS:  Please see Dr. Ardis Hughs note for upper endoscopy findings. Both nasal passages had bloody crusting, worse on the right side.  The left nasal vestibule is a bit stenosed.  Upon removal of nasal crusting, he bled from multiple places in both sides of the nose, primarily from the septum.  Bleeding was controlled with electrocautery.  DESCRIPTION OF PROCEDURE:  The patient was identified in the holding room and informed consent having been obtained including discussion of risks, benefits, alternatives, the  patient was brought to the operative suite, and put on the operative table in supine position.  Anesthesia was induced and patient was intubated by anesthesia team without difficulty.  Eyes were taped closed and the face was draped.  Bloody crusting was removed from both sides of the nose using Bayonet forceps. An Afrin soaked pledgets were placed on both sides of the nose.  After several minutes, pledgets removed 1st from the left side and a 0-degree telescope was used to evaluate the nasal passage.  Bleeding sites were controlled using suction cautery on a setting of 20.  The right side pledgets were also removed and bleeding was controlled on that side in a likewise fashion.  This consisted of cauterizing about 2 or 3 spots on each side of the septum and a spot on the left middle turbinate.  Once this was completed, nasal bleeding seemed controlled.  The nasal passages and oropharynx were suctioned.  He was then turned over to Dr. Ardis Hughs for upper endoscopy.  Please see the dictated operative note from Dr. Ardis Hughs.  After he was finished, the patient was returned to anesthesia for wake up, was extubated and moved to recovery room in stable condition.     Onnie Graham, MD     DDB/MEDQ  D:  05/03/2016  T:  05/03/2016  Job:  [id number]

## 2016-05-03 NOTE — Transfer of Care (Signed)
Immediate Anesthesia Transfer of Care Note  Patient: Hunter [name]  Procedure(s) Performed: Procedure(s) with comments: NASAL ENDOSCOPY WITH EPISTAXIS CONTROL (N/A) - Dr Oretha Caprice to follow with upper endoscopy ESOPHAGOGASTRODUODENOSCOPY (EGD) (N/A)  Patient Location: PACU  Anesthesia Type:General  Level of Consciousness: sedated and patient cooperative  Airway & Oxygen Therapy: Patient Spontanous Breathing and Patient connected to face mask oxygen  Post-op Assessment: Report given to RN and Post -op Vital signs reviewed and stable  Post vital signs: Reviewed and stable  Last Vitals:  Filed Vitals:   05/03/16 1200 05/03/16 1300  BP: 101/53 111/55  Pulse: 80 80  Temp: 36.8 C   Resp:      Last Pain:  Filed Vitals:   05/03/16 1607  PainSc: Asleep         Complications: No apparent anesthesia complications

## 2016-05-03 NOTE — Consult Note (Signed)
PULMONARY / CRITICAL CARE MEDICINE   Name: Hunter [name] MRN: [id number] DOB: 23-Sep-1935    ADMISSION DATE:  04/29/2016 CONSULTATION DATE:  05/03/16  REFERRING MD:  Dr. Allyson Sabal   CHIEF COMPLAINT:  Nosebleed / Hematemesis   HISTORY OF PRESENT ILLNESS:   81 y/o M with PMH of Cirrhosis, CHF, s/p pacemaker insertion, right heart failure, TR, hereditary hemorrhagic telangiectasia, pulmonary hypertension, atrial fibrillation, hepatic AV fistula, Osler-Weber-Rendu syndrome, renal insufficiency, and melanoma who presented to [facility] on 04/29/16 with complaints of recurrent epistaxis and vomiting blood.   The patient was admitted per [facility] for further evaluation of epistaxis.  ENT evaluated the patient 5/21 and no active bleeding was noted.  However, old blood clots were removed.  On 5/21 PM, the patient began vomiting bright red blood without active epistaxis.  There was concern for additional GIB outside of epistaxis.  GI was consulted and he was treated with protonix, octreotide and empiric rocephin. The patient was transferred to SDU for observation.  He underwent an EGD on 5/22 which could not be completed due to severe epistaxis during the procedure.  There was also concern for aspiration during the EGD.  GI / ENT planned for evaluation under general anesthesia on 5/24 and PCCM consulted out of concern that the patient may need to remain intubated post procedure.   PAST MEDICAL HISTORY :  He  has a past medical history of CHF (congestive heart failure) ([place]); Pacemaker; HHT (hereditary hemorrhagic telangiectasia) ([place]); Anemia; Pulmonary hypertension ([place]); Osler-Weber-Rendu syndrome ([place]); Renal insufficiency; Cirrhosis of liver ([place]); Atrial fibrillation ([place]); Hepatic AV fistula ([place]) (08/09/2015); Cardiac cirrhosis (08/09/2015); Anemia due to chronic blood loss (08/09/2015); Right heart failure ([place]) (08/09/2015); Tricuspid regurgitation (08/09/2015); and Melanoma ([place]).  PAST SURGICAL HISTORY: He  has past  surgical history that includes Pacemaker insertion; Colon surgery (1196); Esophagogastroduodenoscopy (N/A, 08/13/2015); Tonsillectomy (Bilateral); and Esophagogastroduodenoscopy (N/A, 05/01/2016).  No Known Allergies  No current facility-administered medications on file prior to encounter.   Current Outpatient Prescriptions on File Prior to Encounter  Medication Sig  . b complex-vitamin c-folic acid (NEPHRO-VITE) 0.8 MG TABS tablet Take 1 tablet by mouth daily.  . cyanocobalamin (,VITAMIN B-12,) 1000 MCG/ML injection Inject 1,000 mcg into the muscle every 30 (thirty) days. 1st of every month  . digoxin (LANOXIN) 0.125 MG tablet Take 1 tablet (0.125 mg total) by mouth 3 (three) times a week. Mon, Wed, Fri  . feeding supplement, ENSURE ENLIVE, (ENSURE ENLIVE) LIQD Take 237 mLs by mouth 2 (two) times daily between meals.  . ferrous sulfate 325 (65 FE) MG EC tablet Take 325 mg by mouth 2 (two) times daily with a meal.   . KLOR-CON M20 20 MEQ tablet TAKE TWO TABLETS BY MOUTH IN THE MORNING AND ONE TABLET IN THE EVENING  . lactulose (CHRONULAC) 10 GM/15ML solution Take 45 mLs (30 g total) by mouth 3 (three) times daily. Titrate to 3-4 BM's day  . Magnesium 250 MG TABS Take 250 mg by mouth daily.   . metolazone (ZAROXOLYN) 2.5 MG tablet Take 1 tablet (2.5 mg total) by mouth as needed (for weight of 171 lb or greater).  . ondansetron (ZOFRAN) 4 MG tablet Take 1 tablet (4 mg total) by mouth every 8 (eight) hours as needed for nausea or vomiting.  . pantoprazole (PROTONIX) 40 MG tablet TAKE ONE TABLET BY MOUTH ONCE DAILY  . promethazine (PHENERGAN) 12.5 MG tablet Take 0.5 tablets (6.25 mg total) by mouth every 8 (eight) hours as needed for nausea or vomiting. (  Patient taking differently: Take 6.25 mg by mouth daily as needed for nausea or vomiting. )  . spironolactone (ALDACTONE) 50 MG tablet Take 50 mg (1 tab) in am and 25 mg (1/2 tab) in pm.  . SYRINGE-NEEDLE, DISP, 3 ML 25G X 5/8" 3 ML MISC To use with  B12 injections  . torsemide (DEMADEX) 20 MG tablet Take 4 tablets (80 mg total) by mouth 2 (two) times daily.  [name] XIFAXAN 550 MG TABS tablet TAKE ONE TABLET BY MOUTH TWICE DAILY    FAMILY HISTORY:  His indicated that his mother is deceased. He indicated that his father is deceased. He indicated that both of his sisters are alive. He indicated that his brother is alive. He indicated that only one of his two sons is alive.   SOCIAL HISTORY: He  reports that he has never smoked. He has never used smokeless tobacco. He reports that he does not drink alcohol or use illicit drugs.  REVIEW OF SYSTEMS:   12 point ROS positive for abdominal pain, nose bleeds and cough but all else is negative.  SUBJECTIVE:  Abdominal pain and nasal bleed.  VITAL SIGNS: BP 101/53 mmHg  Pulse 80  Temp(Src) 98.3 F (36.8 C) (Oral)  Resp 15  Ht 6' (1.829 m)  Wt 163 lb 5.8 oz (74.1 kg)  BMI 22.15 kg/m2  SpO2 96%  HEMODYNAMICS:    VENTILATOR SETTINGS:    INTAKE / OUTPUT: I/O last 3 completed shifts: In: 45 [P.O.:960; I.V.:6; IV Piggyback:150] Out: 950 [Stool:950]  PHYSICAL EXAMINATION: General:  Chronically ill appearing male, not jaundiced, spider angiomas diffusely. Neuro:  Alert and interactive, moving all ext to command. HEENT:  [place]/AT, PERRL, EOM-I and MMM. Cardiovascular:  RRR, Nl S1/S2, -M/R/G. Lungs:  Bibasilar crackles. Abdomen:  Soft, distended, NT and +BS. Musculoskeletal:  Intact. Skin:  Spider angioma.  LABS:  BMET  Recent Labs Lab 05/01/16 0350 05/02/16 0523 05/03/16 0345  NA 136 136 135  K 4.1 4.6 4.1  CL 102 101 101  CO2 25 23 26   BUN 43* 54* 55*  CREATININE 2.19* 2.42* 2.26*  GLUCOSE 116* 123* 147*    Electrolytes  Recent Labs Lab 04/30/16 1318 05/01/16 0350 05/02/16 0523 05/03/16 0345  CALCIUM 9.0 9.0 8.7* 9.0  MG 2.6*  --   --   --   PHOS 4.7*  --   --   --     CBC  Recent Labs Lab 05/01/16 0350 05/02/16 0523 05/03/16 0345  WBC 5.2 15.2* 6.7   HGB 8.9* 9.1* 8.7*  HCT 28.5* 29.8* 28.8*  PLT 52* 67* 70*    Coag's  Recent Labs Lab 04/29/16 1857 05/01/16 0350  INR 1.34 1.33    Sepsis Markers No results for input(s): LATICACIDVEN, PROCALCITON, O2SATVEN in the last 168 hours.  ABG No results for input(s): PHART, PCO2ART, PO2ART in the last 168 hours.  Liver Enzymes  Recent Labs Lab 04/29/16 1857 04/30/16 1318 05/01/16 0350  AST 36 40 33  ALT 26 29 26   ALKPHOS 137* 137* 127*  BILITOT 2.3* 8.1* 5.5*  ALBUMIN 2.7* 2.8* 2.7*    Cardiac Enzymes No results for input(s): TROPONINI, PROBNP in the last 168 hours.  Glucose No results for input(s): GLUCAP in the last 168 hours.  Imaging No results found.   STUDIES:  5/22  EKG >> V-Paced rhythm, underlying AF  CULTURES:   ANTIBIOTICS: Rocephin 5/22 >> 5/23  Zoysn 5/23 >>   SIGNIFICANT EVENTS: 5/24 EGD and ENT to evaluate.  LINES/TUBES:  I reviewed CXR myself, mild pulmonary edema noted.  DISCUSSION: 81 y/o M admitted with epistaxis and hematemesis.  Initially work up felt that hematemesis was related to epistaxis.  However, later there were concerns for GIB.  Pending EGD/ENT evaluation under general anesthesia 5/24, PCCM consulted in the event that the patient remains on mechanical ventilation.   ASSESSMENT / PLAN:  PULMONARY A: Concern for Aspiration - 5/22 during prior EGD Acute Hypoxic Respiratory Failure - post procedure, aspiration P:   Monitor CXR Encourage pulmonary hygiene - IS, mobilize as able O2 as needed for sats > 92% Will monitor post-procedure.  Should be able to extubate patient pending findings during procedure.    CARDIOVASCULAR A:  Acute on Chronic Diastolic CHF P:  Continue digoxin Hold scheduled diuretics with active bleeding Monitor volume status   RENAL A:   Stage IV CKD Hyponatremia  P:   Monitor UOP / BMP  Replace electrolytes as indicated   GASTROINTESTINAL A:   R/O GIB / Hematemesis  Known Hepatic  AVM, Hx Gastric AVM's Hepatic Cirrhosis  P:   Continue xifaxin  GI Following, pending EGD Continue IV protonix  HEMATOLOGIC A:   Recurrent Epistaxis  Anemia - acute blood loss + chronic disease Hx Hereditary Hemorrhagic Telangiectasia / Osler-Weber-Rendu Syndrome Pancytopenia  P:  Monitor CBC / platelets  SCD's for DVT prophylaxis  Continue iron supplementation ENT following, pending general anesthesia evaluation 5/24   INFECTIOUS A:   Possible Aspiration  P:   Continue abx as above  Monitor cultures / fever curve  ENDOCRINE A:   At Risk Hypo/Hyperglycemia - in setting of NPO status with procedures  P:   Monitor CBG / glucose on BMP   NEUROLOGIC A:   Hepatic Encephalopathy  P:   RASS goal: n/a Supportive care, monitor mental status  Discussed with TRH-MD.  Patient is DNR, he will most likely be extubated post op as he is currently on RA with no breathing difficulty.  PCCM will sign off, if patient remains intubated post op please call us back.  Rush Farmer, M.D. [facility] Pulmonary/Critical Care Medicine. Pager: [phone]. After hours pager: [phone].

## 2016-05-03 NOTE — Anesthesia Postprocedure Evaluation (Signed)
Anesthesia Post Note  Patient: Hunter [name]  Procedure(s) Performed: Procedure(s) (LRB): NASAL ENDOSCOPY WITH EPISTAXIS CONTROL (N/A) ESOPHAGOGASTRODUODENOSCOPY (EGD) (N/A)  Patient location during evaluation: PACU Anesthesia Type: General Level of consciousness: sedated and patient cooperative Pain management: pain level controlled Vital Signs Assessment: post-procedure vital signs reviewed and stable Respiratory status: spontaneous breathing Cardiovascular status: stable Anesthetic complications: no    Last Vitals:  Filed Vitals:   05/03/16 1800 05/03/16 2000  BP: 102/51 111/51  Pulse: 79 80  Temp: 36.7 C 36.2 C  Resp: 13 15    Last Pain:  Filed Vitals:   05/03/16 2029  PainSc: Asleep                 Nolon Nations

## 2016-05-03 NOTE — Anesthesia Preprocedure Evaluation (Addendum)
Anesthesia Evaluation  Patient identified by MRN, date of birth, ID band Patient awake    Reviewed: Allergy & Precautions, NPO status , Patient's Chart, lab work & pertinent test results  Airway Mallampati: II  TM Distance: >3 FB Neck ROM: Full    Dental   Pulmonary    Pulmonary exam normal        Cardiovascular +CHF  + pacemaker  Rhythm:Regular     Neuro/Psych    GI/Hepatic (+) Cirrhosis       ,   Endo/Other    Renal/GU Renal InsufficiencyRenal disease     Musculoskeletal   Abdominal   Peds  Hematology   Anesthesia Other Findings   Reproductive/Obstetrics                             Anesthesia Physical  Anesthesia Plan  ASA: III  Anesthesia Plan: General   Post-op Pain Management:    Induction: Intravenous  Airway Management Planned: Oral ETT  Additional Equipment:   Intra-op Plan:   Post-operative Plan: Extubation in OR  Informed Consent: I have reviewed the patients History and Physical, chart, labs and discussed the procedure including the risks, benefits and alternatives for the proposed anesthesia with the patient or authorized representative who has indicated his/her understanding and acceptance.   Dental advisory given  Plan Discussed with: CRNA  Anesthesia Plan Comments:        Anesthesia Quick Evaluation

## 2016-05-04 ENCOUNTER — Encounter (HOSPITAL_COMMUNITY): Payer: Self-pay | Admitting: Otolaryngology

## 2016-05-04 LAB — CBC
HEMATOCRIT: 31 % — AB (ref 39.0–52.0)
Hemoglobin: 9.3 g/dL — ABNORMAL LOW (ref 13.0–17.0)
MCH: 27.2 pg (ref 26.0–34.0)
MCHC: 30 g/dL (ref 30.0–36.0)
MCV: 90.6 fL (ref 78.0–100.0)
Platelets: 77 10*3/uL — ABNORMAL LOW (ref 150–400)
RBC: 3.42 MIL/uL — ABNORMAL LOW (ref 4.22–5.81)
RDW: 19.2 % — ABNORMAL HIGH (ref 11.5–15.5)
WBC: 6.1 10*3/uL (ref 4.0–10.5)

## 2016-05-04 LAB — COMPREHENSIVE METABOLIC PANEL
ALBUMIN: 2.6 g/dL — AB (ref 3.5–5.0)
ALK PHOS: 127 U/L — AB (ref 38–126)
ALT: 23 U/L (ref 17–63)
AST: 34 U/L (ref 15–41)
Anion gap: 9 (ref 5–15)
BILIRUBIN TOTAL: 3.7 mg/dL — AB (ref 0.3–1.2)
BUN: 53 mg/dL — AB (ref 6–20)
CALCIUM: 8.9 mg/dL (ref 8.9–10.3)
CO2: 23 mmol/L (ref 22–32)
Chloride: 107 mmol/L (ref 101–111)
Creatinine, Ser: 2.18 mg/dL — ABNORMAL HIGH (ref 0.61–1.24)
GFR calc Af Amer: 32 mL/min — ABNORMAL LOW (ref >60)
GFR calc non Af Amer: 27 mL/min — ABNORMAL LOW (ref >60)
GLUCOSE: 169 mg/dL — AB (ref 65–99)
POTASSIUM: 4.2 mmol/L (ref 3.5–5.1)
Sodium: 139 mmol/L (ref 135–145)
TOTAL PROTEIN: 5 g/dL — AB (ref 6.5–8.1)

## 2016-05-04 LAB — TYPE AND SCREEN
ABO/RH(D): A POS
ANTIBODY SCREEN: NEGATIVE
Unit division: 0

## 2016-05-04 LAB — PREPARE PLATELET PHERESIS
UNIT DIVISION: 0
Unit division: 0

## 2016-05-04 MED ORDER — AMOXICILLIN-POT CLAVULANATE 500-125 MG PO TABS
1.0000 | ORAL_TABLET | Freq: Two times a day (BID) | ORAL | Status: DC
  Administered 2016-05-04 – 2016-05-05 (×2): 500 mg via ORAL
  Filled 2016-05-04 (×3): qty 1

## 2016-05-04 MED ORDER — TORSEMIDE 20 MG PO TABS
10.0000 mg | ORAL_TABLET | Freq: Two times a day (BID) | ORAL | Status: DC
  Administered 2016-05-04 – 2016-05-05 (×2): 10 mg via ORAL
  Filled 2016-05-04 (×3): qty 1

## 2016-05-04 MED ORDER — PANTOPRAZOLE SODIUM 40 MG PO TBEC
40.0000 mg | DELAYED_RELEASE_TABLET | Freq: Every day | ORAL | Status: DC
  Administered 2016-05-04 – 2016-05-05 (×2): 40 mg via ORAL
  Filled 2016-05-04 (×2): qty 1

## 2016-05-04 NOTE — Progress Notes (Signed)
Daily Rounding Note  05/04/2016, 8:13 AM  LOS: 5 days   SUBJECTIVE:       No further epistaxis.   Feels ok.  No complaints.  1 stool recorded yesterday.  None overnight or this AM.    OBJECTIVE:         Vital signs in last 24 hours:    Temp:  [97.2 F (36.2 C)-98.3 F (36.8 C)] 97.4 F (36.3 C) (05/25 0408) Pulse Rate:  [77-90] 77 (05/25 0408) Resp:  [13-19] 14 (05/25 0408) BP: (89-113)/(44-62) 105/58 mmHg (05/25 0300) SpO2:  [91 %-99 %] 98 % (05/25 0408) Last BM Date: 05/03/16 Filed Weights   04/29/16 2331 05/01/16 0030  Weight: 74.934 kg (165 lb 3.2 oz) 74.1 kg (163 lb 5.8 oz)   General: weak, frail, alert, comfortable   Heart: RRR Chest: clear bil.  Some cough, no dyspnea Abdomen: soft, NT, ND.  Extremities: multiple purpura Neuro/Psych:  Oriented x 3.  Alert.  Calm.  Follows commands.  No asterixis.   Intake/Output from previous day: 05/24 0701 - 05/25 0700 In: 3096 [P.O.:720; I.V.:1106; Blood:670; IV Piggyback:600] Out: 725 [Urine:325; Blood:400]  Intake/Output this shift:    Lab Results:  Recent Labs  05/02/16 0523 05/03/16 0345 05/04/16 0232  WBC 15.2* 6.7 6.1  HGB 9.1* 8.7* 9.3*  HCT 29.8* 28.8* 31.0*  PLT 67* 70* 77*   BMET  Recent Labs  05/02/16 0523 05/03/16 0345 05/04/16 0232  NA 136 135 139  K 4.6 4.1 4.2  CL 101 101 107  CO2 23 26 23   GLUCOSE 123* 147* 169*  BUN 54* 55* 53*  CREATININE 2.42* 2.26* 2.18*  CALCIUM 8.7* 9.0 8.9   LFT  Recent Labs  05/04/16 0232  PROT 5.0*  ALBUMIN 2.6*  AST 34  ALT 23  ALKPHOS 127*  BILITOT 3.7*   PT/INR No results for input(s): LABPROT, INR in the last 72 hours. Hepatitis Panel No results for input(s): HEPBSAG, HCVAB, HEPAIGM, HEPBIGM in the last 72 hours.  Studies/Results: No results found.   Scheduled Meds: . antiseptic oral rinse  7 mL Mouth Rinse BID  . digoxin  0.125 mg Oral Once per day on Mon Wed Fri  . feeding  supplement (ENSURE ENLIVE)  237 mL Oral BID BM  . lactulose  30 g Oral TID  . magnesium oxide  400 mg Oral Daily  . multivitamin  1 tablet Oral QHS  . oxymetazoline  1 spray Each Nare BID  . pantoprazole (PROTONIX) IV  40 mg Intravenous Q12H  . piperacillin-tazobactam (ZOSYN)  IV  3.375 g Intravenous Q8H  . rifaximin  550 mg Oral BID  . sodium chloride  1 spray Each Nare Q2H while awake  . sodium chloride flush  3 mL Intravenous Q12H   Continuous Infusions: . sodium chloride 10 mL/hr at 05/03/16 1339   PRN Meds:.ondansetron   ASSESMENT:   * Hematemesis in setting of epistaxis. 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 EGD 08/2015: APC ablation of bleeding gastric AVMs.  05/02/15 EGD: aborted due to epistaxis, limited views of stomach and esophagus due to red blood tracking down from his mouth. Afrin added 5/22.  5/24 EGD: APC ablation of multiple bleeding gastric AVMs.   5/24 Nasal endoscopy with cauterization of multiple bleeding sites.    * Blood loss anemia. S/p PRBC x 4;  last on 5/24. Feraheme infusions 11/26/15, 12/03/15, 03/06/16, 03/13/16.   * Likely aspiration PNA. Zosyn has replaced Rocephin. Breathing much improved.   * Thrombocytopenia.   * Stage 4 CKD.   * Hx HE. On chronic Lactulose and Rifaximin. 4 BMs yesterday. Some asterixis on exam.     PLAN   *  Supportive care.  Follow CBC.  Daily oral PPI. Continue Lactulose and Rifaximin.     Azucena Freed  05/04/2016, 8:13 AM Pager: [phone]  ________________________________________________________________________  [name] GI MD note:  I personally examined the patient, reviewed the data and agree with the assessment and plan described above.  NO sign of continued bleeding.  He is eating well. OK for him to go home tomorrow likely.  He should  maintain low salt diet, stay on PPI twice daily.    Please call or page with any further questions or concerns.    Owens Loffler, MD [facility] Pager [phone]

## 2016-05-04 NOTE — Progress Notes (Signed)
Attempted report X1. RN to call back for report.

## 2016-05-04 NOTE — Progress Notes (Signed)
PROGRESS NOTE  Hunter [name] [name] DOB: 08/17/1935 DOA: 04/29/2016 PCP: Penni Homans, MD   LOS: 5 days   Brief Narrative: 81 y.o. male with medical history significant of Hepatic cirrhosis with history of hereditary hemorrhagic telangiectasia with chronic anemia due to chronic blood loss, Chronic diastolic heart failure, sp pacemaker, recurrent hepatic encephalopathy chronic kidney disease recurrent epistaxis, presented to the emergency room on 5/20 with unrelenting nosebleed resulting in vomiting blood.   Interval history  Patient admitted to the hospital as above, ENT was consulted and evaluated patient on 5/21, no active bleeding was seen however old blood clots which were removed. On 5/21 in the evening, patient started vomiting bright red blood without active epistaxis, likely to represent a GI bleed in addition to his nosebleeds. Gastroenterology was consulted, patient was started on Protonix, octreotide, ceftriaxone and moved to step down. He underwent an EGD on 5/22 however could not be completed because patient had severe epistaxis while in the procedure. He was moved back to SDU. Appears that he aspirated during all this. Discussed with Dr. Redmond Baseman, patient underwent ENT/GI evaluation under GA and with ETT for airway protection on 5/24.  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 He has known angiodysplastic lesions, seen on most recent EGD on September 2016. There is no reported esophageal varices on most recent EGD Currently on IV Protonix, started on Zosyn for possible aspiration -EGD 5/22 aborted due to epistaxis,EGD and ENT eval under anesthesia in OR today  5/24 EGD: APC ablation of multiple bleeding gastric AVMs. hemodynamically stable post procedure and overnight , currently 95% on 2 L of nasal canula  5/24 Nasal endoscopy with cauterization of multiple bleeding sites Anticipate discharge tomorrow if  CBC stable, transfer to telemetry  Acute blood loss anemia Nadir Hgb 7.2. S/p PRBC x 4, now 9.3    Acute hypoxic respiratory failure - 5/22 post aspiration - received 40 IV Lasix 5/22 pm, breathing much improved this morning, on room air, diuretics were placed on hold , resume Demadex and wean oxygen - WBC up, changed antibiotics from Ceftriaxone to Zosyn to cover aspiration 5/23, now improved augmentin for 3 more days then DC abx   Aspiration pneumonitis / pneumonia Switch Zosyn to Augmentin 3 more days  Acute on chronic recurrent epistaxis - Patient's epistaxis has recently gotten worse in the last days prior to admission.  - His epistaxis has been intermittent during his hospital stay - as above, OR evaluation 5/24 by ENT along GI  Hepatic encephalopathy - In the setting of liver cirrhosis - Ammonia elevated at 83 on admission, currently on lactulose 30 g every 6 hours, monitor mental status - Continue rifaximin  , recheck ammonia level    Anemia due to acute on chronic blood loss - Related to hereditary hemorrhagic telangiectasia,  chronic epistaxis - Hemoglobin 7.2 on admission, status post 4 units of packed red blood cells - Continue iron supplements - Hemoglobin 9.3 this morning, continue to monitor   Liver cirrhosis - Appears  decompensated with encephalopathy, elevated bilirubin, thrombocytopenia  Acute on chronic diastolic CHF Resume Demadex - appears euvolemic this morning - Continue digoxin, held diuretics due to hypotension active GI bleed  - Closely monitor fluid status, may need to re-dose with Lasix this pm, will re-evaluate patient bedside  Hyponatremia - Mild, related to liver disease as well as heart failure  - Resolved  Pancytopenia - Secondary to cirrhosis. Follow CBCs closely. Now WBC up  Stage IV chronic kidney disease - Creatinine appears to be at baseline,   Hereditary hemorrhagic telangiectasia (HHT)   DVT prophylaxis: SCDs Code Status: DNR. This was confirmed with wife on 5/21 Family Communication: Discussed with wife at bedside  Disposition Plan: Anticipate procedure by GI and ENT this afternoon  Consultants:   ENT   Gastroenterology   PCCM  Procedures:   None   Antimicrobials:  Ceftriaxone 5/21>>  Subjective: No further epistaxis  Objective: Filed Vitals:   05/04/16 0300 05/04/16 0408 05/04/16 0800 05/04/16 1200  BP: 105/58  117/68 106/55  Pulse: 79 77 79 79  Temp:  97.4 F (36.3 C) 97.4 F (36.3 C) 97.5 F (36.4 C)  TempSrc:  Oral Oral Oral  Resp: 16 14 15 18   Height:      Weight:      SpO2: 99% 98% 100% 95%    Intake/Output Summary (Last 24 hours) at 05/04/16 1318 Last data filed at 05/04/16 0505  Gross per 24 hour  Intake   2843 ml  Output    725 ml  Net   2118 ml   Filed Weights   04/29/16 2331 05/01/16 0030  Weight: 74.934 kg (165 lb 3.2 oz) 74.1 kg (163 lb 5.8 oz)    Examination: Constitutional: Week appearing male in no distress   Filed Vitals:   05/04/16 0300 05/04/16 0408 05/04/16 0800 05/04/16 1200  BP: 105/58  117/68 106/55  Pulse: 79 77 79 79  Temp:  97.4 F (36.3 C) 97.4 F (36.3 C) 97.5 F (36.4 C)  TempSrc:  Oral Oral Oral  Resp: 16 14 15 18   Height:      Weight:      SpO2: 99% 98% 100% 95%   Eyes: PERRL, lids and  conjunctivae normal, mild scleral icterus  ENMT: red raw mucosa in bilateral nostrils  Respiratory: no wheezing, bibasilar crackles. Normal respiratory effort. No accessory muscle use.  Cardiovascular: Regular rate and rhythm, no murmurs / rubs / gallops. Trace LE edema. 2+ pedal pulses.  Abdomen: no tenderness. Bowel sounds positive.  Musculoskeletal: no clubbing / cyanosis. .  Neurologic:  nonfocal.    Data Reviewed: I have personally reviewed following labs and imaging studies  CBC:  Recent Labs Lab 04/29/16 1857 04/30/16 1318 05/01/16 0350 05/02/16 0523 05/03/16 0345 05/04/16 0232  WBC 3.9* 4.8 5.2 15.2* 6.7 6.1  NEUTROABS 2.8  --   --   --   --   --   HGB 7.2* 9.1* 8.9* 9.1* 8.7* 9.3*  HCT 23.9* 29.6* 28.5* 29.8* 28.8* 31.0*  MCV 89.5 86.3 86.4 87.9 89.2 90.6  PLT 61* 54* 52* 67* 70* 77*   Basic Metabolic Panel:  Recent Labs Lab 04/30/16 1318 05/01/16 0350 05/02/16 0523 05/03/16 0345 05/04/16 0232  NA 132* 136 136 135 139  K 4.0 4.1 4.6 4.1 4.2  CL 101 102 101 101 107  CO2 23 25 23 26 23   GLUCOSE 166* 116* 123* 147* 169*  BUN 45* 43* 54* 55* 53*  CREATININE 2.38* 2.19* 2.42* 2.26* 2.18*  CALCIUM 9.0 9.0 8.7* 9.0 8.9  MG 2.6*  --   --   --   --   PHOS 4.7*  --   --   --   --    GFR: Estimated Creatinine Clearance: 29.3 mL/min (by C-G formula based on Cr of 2.18). Liver Function Tests:  Recent Labs Lab 04/29/16 1857 04/30/16 1318 05/01/16 0350 05/04/16 0232  AST 36 40 33 34  ALT 26 29 26 23   ALKPHOS 137* 137* 127* 127*  BILITOT 2.3* 8.1* 5.5* 3.7*  PROT 5.1* 5.4* 5.0* 5.0*  ALBUMIN 2.7* 2.8* 2.7* 2.6*   No results for input(s): LIPASE, AMYLASE in the last 168 hours.  Recent Labs Lab 04/29/16 1857 04/30/16 1318  AMMONIA 83* 60*   Coagulation Profile:  Recent Labs Lab 04/29/16 1857 05/01/16 0350  INR 1.34 1.33   Cardiac Enzymes: No results for input(s): CKTOTAL, CKMB, CKMBINDEX, TROPONINI in the last 168 hours. BNP (last 3  results)  Recent Labs  08/09/15 1621  PROBNP 188.0*   HbA1C: No results for input(s): HGBA1C in the last 72 hours. CBG: No results for input(s): GLUCAP in the last 168 hours. Lipid Profile: No results for input(s): CHOL, HDL, LDLCALC, TRIG, CHOLHDL, LDLDIRECT in the last 72 hours. Thyroid Function Tests: No results for input(s): TSH, T4TOTAL, FREET4, T3FREE, THYROIDAB in the last 72 hours. Anemia Panel: No results for input(s): VITAMINB12, FOLATE, FERRITIN, TIBC, IRON, RETICCTPCT in the last 72 hours. Urine analysis:    Component Value Date/Time   COLORURINE YELLOW 04/29/2016 2107   APPEARANCEUR CLEAR 04/29/2016 2107   LABSPEC 1.011 04/29/2016 2107   PHURINE 7.0 04/29/2016 2107   GLUCOSEU NEGATIVE 04/29/2016 2107   HGBUR NEGATIVE 04/29/2016 2107   BILIRUBINUR NEGATIVE 04/29/2016 2107   KETONESUR NEGATIVE 04/29/2016 2107   PROTEINUR NEGATIVE 04/29/2016 2107   UROBILINOGEN 0.2 08/10/2015 1700   NITRITE NEGATIVE 04/29/2016 2107   LEUKOCYTESUR NEGATIVE 04/29/2016 2107   Sepsis Labs: Invalid input(s): PROCALCITONIN, LACTICIDVEN  Recent Results (from the past 240 hour(s))  MRSA PCR Screening     Status: None   Collection Time: 05/01/16 12:30 AM  Result Value Ref Range Status   MRSA by PCR NEGATIVE NEGATIVE Final    Comment:        The GeneXpert MRSA Assay (FDA approved for NASAL specimens only), is one component of a comprehensive MRSA colonization surveillance program. It is not intended to diagnose MRSA infection nor to guide or monitor treatment for MRSA infections.       Radiology Studies: No results found.   Scheduled Meds: . antiseptic oral rinse  7 mL Mouth Rinse BID  . digoxin  0.125 mg Oral Once per day on Mon Wed Fri  . feeding supplement (ENSURE ENLIVE)  237 mL Oral BID BM  . lactulose  30 g Oral TID  . magnesium oxide  400 mg Oral Daily  . multivitamin  1 tablet Oral QHS  . oxymetazoline  1 spray Each Nare BID  . pantoprazole  40 mg Oral Q0600   . piperacillin-tazobactam (ZOSYN)  IV  3.375 g Intravenous Q8H  . rifaximin  550 mg Oral BID  . sodium chloride  1 spray Each Nare Q2H while awake  . sodium chloride flush  3 mL Intravenous Q12H   Continuous Infusions: .  sodium chloride 10 mL/hr at 05/03/16 1339    Time spent: 35 minutes Reyne Dumas , MD,  [facility] Pager [phone]  If 7PM-7AM, please contact night-coverage [email or web address] Password [email or web address] 05/04/2016, 1:18 PM

## 2016-05-05 LAB — COMPREHENSIVE METABOLIC PANEL
ALT: 24 U/L (ref 17–63)
AST: 38 U/L (ref 15–41)
Albumin: 2.6 g/dL — ABNORMAL LOW (ref 3.5–5.0)
Alkaline Phosphatase: 143 U/L — ABNORMAL HIGH (ref 38–126)
Anion gap: 7 (ref 5–15)
BUN: 50 mg/dL — ABNORMAL HIGH (ref 6–20)
CHLORIDE: 103 mmol/L (ref 101–111)
CO2: 25 mmol/L (ref 22–32)
CREATININE: 2.17 mg/dL — AB (ref 0.61–1.24)
Calcium: 8.4 mg/dL — ABNORMAL LOW (ref 8.9–10.3)
GFR, EST AFRICAN AMERICAN: 32 mL/min — AB (ref >60)
GFR, EST NON AFRICAN AMERICAN: 27 mL/min — AB (ref >60)
Glucose, Bld: 105 mg/dL — ABNORMAL HIGH (ref 65–99)
POTASSIUM: 3.9 mmol/L (ref 3.5–5.1)
Sodium: 135 mmol/L (ref 135–145)
TOTAL PROTEIN: 4.8 g/dL — AB (ref 6.5–8.1)
Total Bilirubin: 3.5 mg/dL — ABNORMAL HIGH (ref 0.3–1.2)

## 2016-05-05 LAB — CBC
HCT: 30.3 % — ABNORMAL LOW (ref 39.0–52.0)
Hemoglobin: 9.1 g/dL — ABNORMAL LOW (ref 13.0–17.0)
MCH: 27.2 pg (ref 26.0–34.0)
MCHC: 30 g/dL (ref 30.0–36.0)
MCV: 90.7 fL (ref 78.0–100.0)
PLATELETS: 63 10*3/uL — AB (ref 150–400)
RBC: 3.34 MIL/uL — ABNORMAL LOW (ref 4.22–5.81)
RDW: 19.3 % — AB (ref 11.5–15.5)
WBC: 5.7 10*3/uL (ref 4.0–10.5)

## 2016-05-05 MED ORDER — ENSURE ENLIVE PO LIQD: 237.0000 mL | Freq: Two times a day (BID) | ORAL | Status: DC

## 2016-05-05 MED ORDER — AMOXICILLIN-POT CLAVULANATE 500-125 MG PO TABS: 1.0000 | ORAL_TABLET | Freq: Two times a day (BID) | ORAL | Status: DC

## 2016-05-05 MED ORDER — TORSEMIDE 10 MG PO TABS
20.0000 mg | ORAL_TABLET | Freq: Two times a day (BID) | ORAL | Status: DC
Start: 2016-05-05 — End: 2016-06-06

## 2016-05-05 MED ORDER — RIFAXIMIN 550 MG PO TABS: 550.0000 mg | ORAL_TABLET | Freq: Two times a day (BID) | ORAL | Status: AC

## 2016-05-05 MED ORDER — OXYMETAZOLINE HCL 0.05 % NA SOLN: 1.0000 | Freq: Two times a day (BID) | NASAL | Status: AC

## 2016-05-05 MED ORDER — ONDANSETRON HCL 4 MG PO TABS: 4.0000 mg | ORAL_TABLET | Freq: Three times a day (TID) | ORAL | Status: AC | PRN

## 2016-05-05 NOTE — Evaluation (Signed)
Physical Therapy Evaluation Patient Details Name: Hunter [name] MRN: [id number] DOB: [birth year] Today's Date: 05/05/2016   History of Present Illness  Patient is a 81 yo male admitted 04/29/16 with severe nosebleed, hematemesis, anemia.  Pt underwent endoscopy on 5/22 which had to be aborted due to nosebleed and pt transferred to step down. PMH:  HHT, CHF, pacemaker, anemia, renal insufficiency, liver cirrhosis, Afib, recurrent hepatic encephalopathy, CKD  Clinical Impression  Pt admitted with above diagnosis and presents to PT with functional limitations due to deficits listed below (See PT problem list). Pt needs skilled PT to maximize independence and safety to allow discharge to home with family.      Follow Up Recommendations Home health PT;Supervision for mobility/OOB    Equipment Recommendations  None recommended by PT    Recommendations for Other Services       Precautions / Restrictions Precautions Precautions: Fall Restrictions Weight Bearing Restrictions: No      Mobility  Bed Mobility Overal bed mobility: Modified Independent             General bed mobility comments: Increased time  Transfers Overall transfer level: Needs assistance Equipment used: Rolling walker (2 wheeled) Transfers: Sit to/from Stand Sit to Stand: Supervision Stand pivot transfers: Min assist       General transfer comment: for safety  Ambulation/Gait Ambulation/Gait assistance: Supervision Ambulation Distance (Feet): 200 Feet Assistive device: Rolling walker (2 wheeled) Gait Pattern/deviations: Step-through pattern;Decreased stride length;Trunk flexed   Gait velocity interpretation: <1.8 ft/sec, indicative of risk for recurrent falls General Gait Details: Pt able to negotiate obstacles in hallway without assist. Self corrected distance from walker.  Stairs            Wheelchair Mobility    Modified Rankin (Stroke Patients Only)       Balance Overall balance  assessment: Needs assistance Sitting-balance support: No upper extremity supported;Feet supported Sitting balance-Leahy Scale: Good     Standing balance support: No upper extremity supported Standing balance-Leahy Scale: Fair                               Pertinent Vitals/Pain Pain Assessment: No/denies pain    Home Living Family/patient expects to be discharged to:: Private residence Living Arrangements: Spouse/significant other Available Help at Discharge: Family;Available 24 hours/day Type of Home: House Home Access: Stairs to enter Entrance Stairs-Rails: None Entrance Stairs-Number of Steps: 2 Home Layout: Two level;Able to live on main level with bedroom/bathroom Home Equipment: Gilford Rile - 2 wheels;Bedside commode;Shower seat Additional Comments: Just moved here from [place] for closer medical care. Pt was always active, hiked AT, biked, etc.    Prior Function Level of Independence: Independent with assistive device(s);Needs assistance   Gait / Transfers Assistance Needed: ambulates with RW with supervision.  Walks in neighborhood with RW and wife.  ADL's / Homemaking Assistance Needed: Wife assists with bathing, meal prep, housekeeping, med management        Hand Dominance   Dominant Hand: Right    Extremity/Trunk Assessment   Upper Extremity Assessment: Defer to OT evaluation           Lower Extremity Assessment: Generalized weakness      Cervical / Trunk Assessment: Kyphotic  Communication   Communication: HOH  Cognition Arousal/Alertness: Awake/alert Behavior During Therapy: Flat affect Overall Cognitive Status: Within Functional Limits for tasks assessed                 General Comments:  Minimal verbal interaction.    General Comments      Exercises        Assessment/Plan    PT Assessment Patient needs continued PT services  PT Diagnosis Difficulty walking;Generalized weakness   PT Problem List Decreased strength;Decreased  balance;Decreased mobility;Decreased knowledge of use of DME;Decreased activity tolerance  PT Treatment Interventions DME instruction;Gait training;Stair training;Functional mobility training;Therapeutic activities;Therapeutic exercise;Patient/family education   PT Goals (Current goals can be found in the Care Plan section) Acute Rehab PT Goals Patient Stated Goal: To go home soon PT Goal Formulation: With patient/family Time For Goal Achievement: 05/12/16 Potential to Achieve Goals: Good    Frequency Min 3X/week   Barriers to discharge        Co-evaluation               End of Session Equipment Utilized During Treatment: Gait belt Activity Tolerance: Patient tolerated treatment well Patient left: in bed;with call bell/phone within reach;with family/visitor present Nurse Communication: Mobility status         Time: [id number] PT Time Calculation (min) (ACUTE ONLY): 10 min   Charges:   PT Evaluation $PT Eval Moderate Complexity: 1 Procedure     PT G Codes:        Aara Jacquot May 13, 2016, 1:47 PM [facility] PT [phone]

## 2016-05-05 NOTE — Evaluation (Signed)
Occupational Therapy Evaluation Patient Details Name: Hunter [name] MRN: [id number] DOB: February 16, 1935 Today's Date: 05/05/2016    History of Present Illness 81 y.o. male with medical history significant of Hepatic cirrhosis with history of hereditary hemorrhagic telangiectasia with chronic anemia due to chronic blood loss, Chronic diastolic heart failure, sp pacemaker, recurrent hepatic encephalopathy chronic kidney disease recurrent epistaxis, presented to the emergency room on 5/20 with unrelenting nosebleed resulting in vomiting blood.    Clinical Impression   Pt admitted with GI bleed. Pt currently with functional limitations due to the deficits listed below (see OT Problem List). Pt will benefit from skilled OT to increase their safety and independence with ADL and functional mobility for ADL to facilitate discharge to venue listed below.      Follow Up Recommendations  Home health OT;Supervision/Assistance - 24 hour    Equipment Recommendations  None recommended by OT       Precautions / Restrictions Precautions Precautions: Fall Restrictions Weight Bearing Restrictions: No      Mobility Bed Mobility Overal bed mobility: Modified Independent             General bed mobility comments: Increased time  Transfers Overall transfer level: Needs assistance Equipment used: Rolling walker (2 wheeled) Transfers: Sit to/from [facility] Sit to Stand: Min assist Stand pivot transfers: Min assist       General transfer comment: VC for hand placement         ADL Overall ADL's : Needs assistance/impaired                 Upper Body Dressing : Set up;Sitting   Lower Body Dressing: Minimal assistance;Sit to/from stand   Toilet Transfer: Minimal assistance;RW;BSC   Toileting- Water quality scientist and Hygiene: Sit to/from stand;Minimal assistance       Functional mobility during ADLs: Minimal assistance;Cueing for safety;Cueing for  sequencing;Rolling walker;Caregiver able to provide necessary level of assistance General ADL Comments: caregiver present and states she helps pt at home when his wife needs her. explained he was overall min A and A at home would be needed upon DC               Pertinent Vitals/Pain Pain Assessment: No/denies pain     Hand Dominance Right      Communication Communication Communication: HOH (wears bil hearing aids)   Cognition Arousal/Alertness: Awake/alert Behavior During Therapy: WFL for tasks assessed/performed;Flat affect Overall Cognitive Status: Within Functional Limits for tasks assessed                                Home Living Family/patient expects to be discharged to:: Private residence Living Arrangements: Spouse/significant other Available Help at Discharge: Family;Available 24 hours/day Type of Home: House Home Access: Stairs to enter [employer] of Steps: 2 Entrance Stairs-Rails: None Home Layout: Two level;Able to live on main level with bedroom/bathroom     Bathroom Shower/Tub: Walk-in shower         Home Equipment: Environmental consultant - 2 wheels;Bedside commode;Shower seat          Prior Functioning/Environment Level of Independence: Independent with assistive device(s);Needs assistance  Gait / Transfers Assistance Needed: ambulates with RW with supervision.  Walks in neighborhood with RW and wife. ADL's / Homemaking Assistance Needed: Wife assists with bathing, meal prep, housekeeping, med management        OT Diagnosis: Generalized weakness   OT Problem List: Decreased strength;Decreased activity tolerance  OT Treatment/Interventions:      OT Goals(Current goals can be found in the care plan section) Acute Rehab OT Goals Patient Stated Goal: To go home soon  OT Frequency:     Barriers to D/C:               End of Session Equipment Utilized During Treatment: Rolling walker Nurse Communication: Mobility  status  Activity Tolerance: Patient tolerated treatment well Patient left: in chair;with call bell/phone within reach;with family/visitor present   Time: [id number] OT Time Calculation (min): 18 min Charges:  OT General Charges $OT Visit: 1 Procedure OT Evaluation $OT Eval Moderate Complexity: 1 Procedure G-Codes:    Payton Mccallum D 05-22-2016, 11:21 AM

## 2016-05-05 NOTE — Discharge Summary (Signed)
Physician Discharge Summary  Hunter [name] MRN: [id number] DOB/AGE: 79-06-1935 79 y.o.  PCP: Penni Homans, MD   Admit date: 04/29/2016 Discharge date: 05/05/2016  Discharge Diagnoses:     Active Problems:   Hepatic AV fistula (HCC)   HHT (hereditary hemorrhagic telangiectasia) (HCC)   Anemia due to chronic blood loss   Cardiac cirrhosis   Chronic kidney disease   Cirrhosis (HCC)   Chronic diastolic heart failure (HCC)   Hepatic encephalopathy (HCC)   S/P placement of cardiac pacemaker   Epistaxis   Anemia due to other cause   Aspiration of blood    Follow-up recommendations Follow-up with PCP in 3-5 days , including all  additional recommended appointments as below Follow-up CBC, CMP, magnesium, ammonia in 3-5 days Follow-up with ENT Onnie Graham, MD      Current Discharge Medication List    START taking these medications   Details  amoxicillin-clavulanate (AUGMENTIN) 500-125 MG tablet Take 1 tablet (500 mg total) by mouth every 12 (twelve) hours. Qty: 10 tablet, Refills: 0    !! feeding supplement, ENSURE ENLIVE, (ENSURE ENLIVE) LIQD Take 237 mLs by mouth 2 (two) times daily between meals. Qty: 237 mL, Refills: 12    oxymetazoline (AFRIN) 0.05 % nasal spray Place 1 spray into both nostrils 2 (two) times daily. Qty: 30 mL, Refills: 0     !! - Potential duplicate medications found. Please discuss with provider.    CONTINUE these medications which have CHANGED   Details  rifaximin (XIFAXAN) 550 MG TABS tablet Take 1 tablet (550 mg total) by mouth 2 (two) times daily. Qty: 60 tablet, Refills: 1    torsemide (DEMADEX) 10 MG tablet Take 2 tablets (20 mg total) by mouth 2 (two) times daily. Qty: 120 tablet, Refills: 1      CONTINUE these medications which have NOT CHANGED   Details  b complex-vitamin c-folic acid (NEPHRO-VITE) 0.8 MG TABS tablet Take 1 tablet by mouth daily. Qty: 30 tablet, Refills: 6    cyanocobalamin (,VITAMIN B-12,) 1000 MCG/ML  injection Inject 1,000 mcg into the muscle every 30 (thirty) days. 1st of every month    digoxin (LANOXIN) 0.125 MG tablet Take 1 tablet (0.125 mg total) by mouth 3 (three) times a week. Mon, Wed, Fri Qty: 15 tablet, Refills: 6    !! feeding supplement, ENSURE ENLIVE, (ENSURE ENLIVE) LIQD Take 237 mLs by mouth 2 (two) times daily between meals. Qty: 237 mL, Refills: 12    ferrous sulfate 325 (65 FE) MG EC tablet Take 325 mg by mouth 2 (two) times daily with a meal.     KLOR-CON M20 20 MEQ tablet TAKE TWO TABLETS BY MOUTH IN THE MORNING AND ONE TABLET IN THE EVENING Qty: 90 tablet, Refills: 1    lactulose (CHRONULAC) 10 GM/15ML solution Take 45 mLs (30 g total) by mouth 3 (three) times daily. Titrate to 3-4 BM's day Qty: 240 mL, Refills: 3    Magnesium 250 MG TABS Take 250 mg by mouth daily.     metolazone (ZAROXOLYN) 2.5 MG tablet Take 1 tablet (2.5 mg total) by mouth as needed (for weight of 171 lb or greater). Qty: 30 tablet, Refills: 1    ondansetron (ZOFRAN) 4 MG tablet Take 1 tablet (4 mg total) by mouth every 8 (eight) hours as needed for nausea or vomiting. Qty: 30 tablet, Refills: 5    pantoprazole (PROTONIX) 40 MG tablet TAKE ONE TABLET BY MOUTH ONCE DAILY Qty: 30 tablet, Refills: 0  promethazine (PHENERGAN) 12.5 MG tablet Take 0.5 tablets (6.25 mg total) by mouth every 8 (eight) hours as needed for nausea or vomiting. Qty: 30 tablet, Refills: 0    saline (AYR) GEL Place 1 application into the nose 2 (two) times daily as needed (for nose bleeds).    spironolactone (ALDACTONE) 50 MG tablet Take 50 mg (1 tab) in am and 25 mg (1/2 tab) in pm. Qty: 45 tablet, Refills: 6    SYRINGE-NEEDLE, DISP, 3 ML 25G X 5/8" 3 ML MISC To use with B12 injections Qty: 50 each, Refills: 2     !! - Potential duplicate medications found. Please discuss with provider.       Discharge Condition:  Stable   Discharge Instructions Get Medicines reviewed and adjusted: Please take all  your medications with you for your next visit with your Primary MD  Please request your Primary MD to go over all hospital tests and procedure/radiological results at the follow up, please ask your Primary MD to get all Hospital records sent to his/her office.  If you experience worsening of your admission symptoms, develop shortness of breath, life threatening emergency, suicidal or homicidal thoughts you must seek medical attention immediately by calling 911 or calling your MD immediately if symptoms less severe.  You must read complete instructions/literature along with all the possible adverse reactions/side effects for all the Medicines you take and that have been prescribed to you. Take any new Medicines after you have completely understood and accpet all the possible adverse reactions/side effects.   Do not drive when taking Pain medications.   Do not take more than prescribed Pain, Sleep and Anxiety Medications  Special Instructions: If you have smoked or chewed Tobacco in the last 2 yrs please stop smoking, stop any regular Alcohol and or any Recreational drug use.  Wear Seat belts while driving.  Please note  You were cared for by a hospitalist during your hospital stay. Once you are discharged, your primary care physician will handle any further medical issues. Please note that NO REFILLS for any discharge medications will be authorized once you are discharged, as it is imperative that you return to your primary care physician (or establish a relationship with a primary care physician if you do not have one) for your aftercare needs so that they can reassess your need for medications and monitor your lab values.  Discharge Instructions    Diet - low sodium heart healthy    Complete by:  As directed      Increase activity slowly    Complete by:  As directed             No Known Allergies    Disposition: 01-Home or Self Care   Consults:   ENT Gastroenterology     Significant Diagnostic Studies:  Ct Head Wo Contrast  04/29/2016  CLINICAL DATA:  Acute onset of altered mental status. Generalized weakness, unsteady gait and aphasia. Initial encounter. EXAM: CT HEAD WITHOUT CONTRAST TECHNIQUE: Contiguous axial images were obtained from the base of the skull through the vertex without intravenous contrast. COMPARISON:  None. FINDINGS: There is no evidence of acute infarction, mass lesion, or intra- or extra-axial hemorrhage on CT. Prominence of the ventricles and sulci reflects moderate cortical volume loss. Cerebellar atrophy is noted. Scattered periventricular white matter change likely reflects small vessel ischemic microangiopathy. The brainstem and fourth ventricle are within normal limits. The basal ganglia are unremarkable in appearance. The cerebral hemispheres demonstrate grossly normal gray-white differentiation.  No mass effect or midline shift is seen. There is no evidence of fracture; visualized osseous structures are unremarkable in appearance. The orbits are within normal limits. There is partial opacification of the right maxillary sinus. The remaining paranasal sinuses and mastoid air cells are well-aerated. No significant soft tissue abnormalities are seen. IMPRESSION: 1. No acute intracranial pathology seen on CT. 2. Moderate cortical volume loss and scattered small vessel ischemic microangiopathy. 3. Partial opacification of the right maxillary sinus. Electronically Signed   By: Garald Balding M.D.   On: 04/29/2016 19:59   Dg Chest Port 1 View  05/01/2016  CLINICAL DATA:  Aspiration of blood from nose bleed. Hx of CHF, and pulmonary HTN. Pt is a nonsmoker. EXAM: PORTABLE CHEST 1 VIEW COMPARISON:  04/21/2016 FINDINGS: Pacer with tip at right ventricle. Numerous leads and wires project over the chest. Midline trachea. Moderate cardiomegaly. Atherosclerosis in the transverse aorta. No right and no definite left pleural  effusion. No pneumothorax. Interstitial prominence is moderate. Patchy left lower and mid lung airspace disease. IMPRESSION: Cardiomegaly with pulmonary interstitial prominence, suspicious for pulmonary venous congestion. Patchy left-sided airspace disease which could represent alveolar edema or especially given the clinical history aspiration versus infection. Electronically Signed   By: Abigail Miyamoto M.D.   On: 05/01/2016 14:40   Dg Chest Port 1 View  04/21/2016  CLINICAL DATA:  Shortness of Breath EXAM: PORTABLE CHEST 1 VIEW COMPARISON:  None. FINDINGS: There is no edema or consolidation. Heart is enlarged with pulmonary vascularity within normal limits. Pacemaker lead is attached to the right ventricle. No adenopathy. There are surgical clips in the lower right neck region. IMPRESSION: Cardiomegaly.  No edema or consolidation. Electronically Signed   By: Lowella Grip III M.D.   On: 04/21/2016 15:49        Filed Weights   05/01/16 0030 05/04/16 1644 05/05/16 0439  Weight: 74.1 kg (163 lb 5.8 oz) 77.973 kg (171 lb 14.4 oz) 79.243 kg (174 lb 11.2 oz)     Microbiology: Recent Results (from the past 240 hour(s))  MRSA PCR Screening     Status: None   Collection Time: 05/01/16 12:30 AM  Result Value Ref Range Status   MRSA by PCR NEGATIVE NEGATIVE Final    Comment:        The GeneXpert MRSA Assay (FDA approved for NASAL specimens only), is one component of a comprehensive MRSA colonization surveillance program. It is not intended to diagnose MRSA infection nor to guide or monitor treatment for MRSA infections.        Blood Culture No results found for: SDES, [place], CULT, REPTSTATUS    Labs: Results for orders placed or performed during the hospital encounter of 04/29/16 (from the past 48 hour(s))  Prepare RBC     Status: None   Collection Time: 05/03/16  2:15 PM  Result Value Ref Range   Order Confirmation ORDER PROCESSED BY BLOOD BANK   Type and screen [place]     Status: None   Collection Time: 05/03/16  2:15 PM  Result Value Ref Range   ABO/RH(D) A POS    Antibody Screen NEG    Sample Expiration 05/06/2016    Unit Number [id number]    Blood Component Type RED CELLS,LR    Unit division 00    Status of Unit ISSUED,FINAL    Transfusion Status OK TO TRANSFUSE    Crossmatch Result Compatible   Prepare Pheresed Platelets     Status: None  Collection Time: 05/03/16  3:01 PM  Result Value Ref Range   Unit Number [id number]    Blood Component Type PLTP LR1 PAS    Unit division 00    Status of Unit REL FROM [facility]    Transfusion Status OK TO TRANSFUSE    Unit Number [id number]    Blood Component Type PLTP LR1 PAS    Unit division 00    Status of Unit ISSUED,FINAL    Transfusion Status OK TO TRANSFUSE   CBC     Status: Abnormal   Collection Time: 05/04/16  2:32 AM  Result Value Ref Range   WBC 6.1 4.0 - 10.5 K/uL   RBC 3.42 (L) 4.22 - 5.81 MIL/uL   Hemoglobin 9.3 (L) 13.0 - 17.0 g/dL   HCT 31.0 (L) 39.0 - 52.0 %   MCV 90.6 78.0 - 100.0 fL   MCH 27.2 26.0 - 34.0 pg   MCHC 30.0 30.0 - 36.0 g/dL   RDW 19.2 (H) 11.5 - 15.5 %   Platelets 77 (L) 150 - 400 K/uL    Comment: CONSISTENT WITH PREVIOUS RESULT  Comprehensive metabolic panel     Status: Abnormal   Collection Time: 05/04/16  2:32 AM  Result Value Ref Range   Sodium 139 135 - 145 mmol/L   Potassium 4.2 3.5 - 5.1 mmol/L   Chloride 107 101 - 111 mmol/L   CO2 23 22 - 32 mmol/L   Glucose, Bld 169 (H) 65 - 99 mg/dL   BUN 53 (H) 6 - 20 mg/dL   Creatinine, Ser 2.18 (H) 0.61 - 1.24 mg/dL   Calcium 8.9 8.9 - 10.3 mg/dL   Total Protein 5.0 (L) 6.5 - 8.1 g/dL   Albumin 2.6 (L) 3.5 - 5.0 g/dL   AST 34 15 - 41 U/L   ALT 23 17 - 63 U/L   Alkaline Phosphatase 127 (H) 38 - 126 U/L   Total Bilirubin 3.7 (H) 0.3 - 1.2 mg/dL   GFR calc non Af Amer 27 (L) >60 mL/min   GFR calc Af Amer 32 (L) >60 mL/min    Comment: (NOTE) The eGFR has been calculated using the  CKD EPI equation. This calculation has not been validated in all clinical situations. eGFR's persistently <60 mL/min signify possible Chronic Kidney Disease.    Anion gap 9 5 - 15  CBC     Status: Abnormal   Collection Time: 05/05/16  3:11 AM  Result Value Ref Range   WBC 5.7 4.0 - 10.5 K/uL   RBC 3.34 (L) 4.22 - 5.81 MIL/uL   Hemoglobin 9.1 (L) 13.0 - 17.0 g/dL   HCT 30.3 (L) 39.0 - 52.0 %   MCV 90.7 78.0 - 100.0 fL   MCH 27.2 26.0 - 34.0 pg   MCHC 30.0 30.0 - 36.0 g/dL   RDW 19.3 (H) 11.5 - 15.5 %   Platelets 63 (L) 150 - 400 K/uL    Comment: CONSISTENT WITH PREVIOUS RESULT  Comprehensive metabolic panel     Status: Abnormal   Collection Time: 05/05/16  3:11 AM  Result Value Ref Range   Sodium 135 135 - 145 mmol/L   Potassium 3.9 3.5 - 5.1 mmol/L   Chloride 103 101 - 111 mmol/L   CO2 25 22 - 32 mmol/L   Glucose, Bld 105 (H) 65 - 99 mg/dL   BUN 50 (H) 6 - 20 mg/dL   Creatinine, Ser 2.17 (H) 0.61 - 1.24 mg/dL   Calcium 8.4 (L) 8.9 -  10.3 mg/dL   Total Protein 4.8 (L) 6.5 - 8.1 g/dL   Albumin 2.6 (L) 3.5 - 5.0 g/dL   AST 38 15 - 41 U/L   ALT 24 17 - 63 U/L   Alkaline Phosphatase 143 (H) 38 - 126 U/L   Total Bilirubin 3.5 (H) 0.3 - 1.2 mg/dL   GFR calc non Af Amer 27 (L) >60 mL/min   GFR calc Af Amer 32 (L) >60 mL/min    Comment: (NOTE) The eGFR has been calculated using the CKD EPI equation. This calculation has not been validated in all clinical situations. eGFR's persistently <60 mL/min signify possible Chronic Kidney Disease.    Anion gap 7 5 - 15     Lipid Panel  No results found for: CHOL, TRIG, HDL, CHOLHDL, VLDL, LDLCALC, LDLDIRECT   No results found for: HGBA1C   Lab Results  Component Value Date   CREATININE 2.17* 05/05/2016     HPI : 81 y/o M with PMH of Cirrhosis, CHF, s/p pacemaker insertion, right heart failure, TR, hereditary hemorrhagic telangiectasia, pulmonary hypertension, atrial fibrillation, hepatic AV fistula, Osler-Weber-Rendu  syndrome, renal insufficiency, and melanoma who presented to [facility] on [facility]/29[facility]16 with complaints of recurrent epistaxis and vomiting blood.   The patient was admitted per [facility] for further evaluation of epistaxis. ENT evaluated the patient 5/21 and no active bleeding was noted. However, old blood clots were removed. On 5/21 PM, the patient began vomiting bright red blood without active epistaxis. There was concern for additional GIB outside of epistaxis. GI was consulted and he was treated with protonix, octreotide and empiric rocephin. The patient was transferred to SDU for observation. He underwent an EGD on 5/22 which could not be completed due to severe epistaxis during the procedure. There was also concern for aspiration during the EGD. 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 He has known angiodysplastic lesions, seen on most recent EGD on September 2016. There is no reported esophageal varices on most recent EGD Patient placed on IV Protonix, started on Zosyn for possible aspiration -EGD 5/22 aborted due to epistaxis,EGD and ENT eval under anesthesia in OR today 5/24 EGD: APC ablation of multiple bleeding gastric AVMs. hemodynamically stable post procedure and overnight , currently 95% on 2 L of nasal canula  5/24 Nasal endoscopy with cauterization of multiple bleeding sites Hemoglobin stable post procedure, 9.3 > 9.1 overnight   recommend to follow outpatient CBC closely  Acute blood loss anemia Nadir  Hgb 7.2. S/p PRBC x 4, now 9.3> 9.1   Acute hypoxic respiratory failure - 5/22 post aspiration - received 40 IV Lasix 5/22 pm, breathing much improved this morning, on room air, diuretics were placed on hold , resumed Demadex at a lower dose - WBC up, changed antibiotics from Ceftriaxone to Zosyn to cover aspiration 5/23, now improved, Switched to augmentin for 5 more days then DC abx   Aspiration pneumonitis / pneumonia Switch Zosyn to Augmentin 5 more days  Acute on chronic recurrent epistaxis - His epistaxis has been intermittent during his hospital stay -  as above, OR evaluation 5/24 by ENT along GI-nasal endoscopy with cautery and upper GI endoscopy  Continue to use nasal saline spray and Afrin for intermittent nosebleeds   Hepatic encephalopathy - In the setting of liver cirrhosis - Ammonia elevated at 83 on admission, currently on lactulose 30 g every 6 hours, and rifaximin       Anemia due to acute on chronic blood loss - Related to hereditary hemorrhagic telangiectasia, chronic epistaxis - Hemoglobin 7.2 on admission, status post 4 units of packed red blood cells - Continue iron supplements - Hemoglobin 9.3 this morning, continue to monitor   Liver cirrhosis - Appear to be decompensated with encephalopathy, elevated bilirubin, thrombocytopenia Holding Aldactone, resumed Demadex at a lower dose due to low blood pressure   Acute on chronic diastolic CHF Resume Demadex - appears euvolemic this morning - Continue digoxin, held diuretics due to hypotension and active GI bleed on admission   Hyponatremia - Mild, related to liver disease as well as heart failure  - Resolved  Pancytopenia - Secondary to cirrhosis. Follow CBCs closely. Now WBC up  Stage IV chronic kidney disease - Creatinine appears to be at baseline,   Hereditary hemorrhagic telangiectasia (HHT)   Discharge Exam:    Blood pressure 100/52, pulse 80, temperature 98.1 F (36.7 C),  temperature source Oral, resp. rate 18, height 6' (1.829 m), weight 79.243 kg (174 lb 11.2 oz), SpO2 96 %.  General: Chronically ill appearing male, not jaundiced, spider angiomas diffusely. Neuro: Alert and interactive, moving all ext to command. HEENT: [place]/AT, PERRL, EOM-I and MMM. Cardiovascular: RRR, Nl S1/S2, -M/R/G. Lungs: Bibasilar crackles. Abdomen: Soft, distended, NT and +BS. Musculoskeletal: Intact. Skin: Spider angioma    Follow-up Information    Follow up with BATES, DWIGHT, MD. Schedule an appointment as soon as possible for a visit in 1 week.   Specialty:  Otolaryngology   Contact information:   [address] [place] [address] [phone]       Follow up with Penni Homans, MD. Schedule an appointment as soon as possible for a visit in 3 days.   Specialty:  Family Medicine   Why:  Hospital follow-up   Contact information:   [address] [place] [address] [phone]       Signed: Reyne Dumas 05/05/2016, 10:47 AM        Time spent >45 mins

## 2016-05-05 NOTE — Care Management Note (Signed)
Case Management Note  Patient Details  Name: Hunter [name] MRN: [id number] Date of Birth: [birth year]  Subjective/Objective:    Patient lives with spouse, he uses rolling walker at home, he is active with [facility] for [facility], [place], will need to add [place],  [facility] notified that patient will be discharged today. Patient has a PCP, and he pays out of pocket for his medications.  NCM went over medications with wife, she states she will be fine she has rifaximin at home, and she can afford the others zofran 92.36, nasal spray 7.68, torsemide 59.48, amoxicillian 18. 38.  He will be going home via car.                Action/Plan:   Expected Discharge Date:  05/04/16               Expected Discharge Plan:  [place]  In-House Referral:     Discharge planning Services  CM Consult  Post Acute Care Choice:    Choice offered to:     DME Arranged:    DME Agency:     HH Arranged:  RN, PT, OT [place] Agency:  [facility]  Status of Service:  Completed, signed off  Medicare Important Message Given:  Yes Date Medicare IM Given:    Medicare IM give by:    Date Additional Medicare IM Given:    Additional Medicare Important Message give by:     If discussed at [place] of Stay Meetings, dates discussed:    Additional Comments:  Zenon Mayo, RN 05/05/2016, 1:51 PM

## 2016-05-09 ENCOUNTER — Encounter: Payer: Self-pay | Admitting: Family Medicine

## 2016-05-09 ENCOUNTER — Ambulatory Visit (INDEPENDENT_AMBULATORY_CARE_PROVIDER_SITE_OTHER): Payer: Medicare Other | Admitting: Family Medicine

## 2016-05-09 VITALS — BP 104/62 | HR 90 | Temp 98.2°F | Ht 72.0 in | Wt 174.5 lb

## 2016-05-09 DIAGNOSIS — D500 Iron deficiency anemia secondary to blood loss (chronic): Secondary | ICD-10-CM

## 2016-05-09 DIAGNOSIS — R040 Epistaxis: Secondary | ICD-10-CM | POA: Diagnosis not present

## 2016-05-09 DIAGNOSIS — R197 Diarrhea, unspecified: Secondary | ICD-10-CM

## 2016-05-09 DIAGNOSIS — D649 Anemia, unspecified: Secondary | ICD-10-CM | POA: Diagnosis not present

## 2016-05-09 HISTORY — DX: Diarrhea, unspecified: R19.7

## 2016-05-09 MED ORDER — DIGOXIN 125 MCG PO TABS: 0.1250 mg | ORAL_TABLET | ORAL | Status: AC

## 2016-05-09 NOTE — Patient Instructions (Signed)
[place] makes 10 strain cao 1 cap daily, at [place] benefiber twice daily if needed  Diarrhea Diarrhea is frequent loose and watery bowel movements. It can cause you to feel weak and dehydrated. Dehydration can cause you to become tired and thirsty, have a dry mouth, and have decreased urination that often is dark yellow. Diarrhea is a sign of another problem, most often an infection that will not last long. In most cases, diarrhea typically lasts 2-3 days. However, it can last longer if it is a sign of something more serious. It is important to treat your diarrhea as directed by your caregiver to lessen or prevent future episodes of diarrhea. CAUSES  Some common causes include:  Gastrointestinal infections caused by viruses, bacteria, or parasites.  Food poisoning or food allergies.  Certain medicines, such as antibiotics, chemotherapy, and laxatives.  Artificial sweeteners and fructose.  Digestive disorders. HOME CARE INSTRUCTIONS  Ensure adequate fluid intake (hydration): Have 1 cup (8 oz) of fluid for each diarrhea episode. Avoid fluids that contain simple sugars or sports drinks, fruit juices, whole milk products, and sodas. Your urine should be clear or pale yellow if you are drinking enough fluids. Hydrate with an oral rehydration solution that you can purchase at pharmacies, retail stores, and online. You can prepare an oral rehydration solution at home by mixing the following ingredients together:   - tsp table salt.   tsp baking soda.   tsp salt substitute containing potassium chloride.  1  tablespoons sugar.  1 L (34 oz) of water.  Certain foods and beverages may increase the speed at which food moves through the gastrointestinal (GI) tract. These foods and beverages should be avoided and include:  Caffeinated and alcoholic beverages.  High-fiber foods, such as raw fruits and vegetables, nuts, seeds, and whole grain breads and cereals.  Foods and beverages  sweetened with sugar alcohols, such as xylitol, sorbitol, and mannitol.  Some foods may be well tolerated and may help thicken stool including:  Starchy foods, such as rice, toast, pasta, low-sugar cereal, oatmeal, grits, baked potatoes, crackers, and bagels.  Bananas.  Applesauce.  Add probiotic-rich foods to help increase healthy bacteria in the GI tract, such as yogurt and fermented milk products.  Wash your hands well after each diarrhea episode.  Only take over-the-counter or prescription medicines as directed by your caregiver.  Take a warm bath to relieve any burning or pain from frequent diarrhea episodes. SEEK IMMEDIATE MEDICAL CARE IF:   You are unable to keep fluids down.  You have persistent vomiting.  You have blood in your stool, or your stools are black and tarry.  You do not urinate in 6-8 hours, or there is only a small amount of very dark urine.  You have abdominal pain that increases or localizes.  You have weakness, dizziness, confusion, or light-headedness.  You have a severe headache.  Your diarrhea gets worse or does not get better.  You have a fever or persistent symptoms for more than 2-3 days.  You have a fever and your symptoms suddenly get worse. MAKE SURE YOU:   Understand these instructions.  Will watch your condition.  Will get help right away if you are not doing well or get worse.   This information is not intended to replace advice given to you by your health care provider. Make sure you discuss any questions you have with your health care provider.   Document Released: 11/17/2002 Document Revised: 12/18/2014 Document Reviewed: 08/04/2012 Elsevier  Interactive Patient Education 2016 [employer]  

## 2016-05-09 NOTE — Progress Notes (Signed)
Patient ID: Hunter [name], male   DOB: 1935/11/25, 81 y.o.   MRN: [id number]   Subjective:    Patient ID: Hunter [name], male    DOB: March 20, 1935, 81 y.o.   MRN: [id number]  Chief Complaint  Patient presents with  . Hospitalization Follow-up    HPI Patient is in today for hospital follow up accompanied by his wife. They report he is doing better than he was in the hospital. He has had only one small nose bleed since returning home he tolerated his scopes by Dr Redmond Baseman of ENT and Dr Ardis Hughs of GI while in the hospital. He has had persitent loose stool since returning home. No bloody or tarry stool. Denies CP/palp/SOB/HA/congestion/fevers or GU c/o. Taking meds as prescribed  Past Medical History  Diagnosis Date  . CHF (congestive heart failure) ([place])   . Pacemaker     single lead Medtronic pacemaker  . HHT (hereditary hemorrhagic telangiectasia) ([place])   . Anemia   . Pulmonary hypertension ([place])   . 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 Renal insufficiency   . 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   . Atrial fibrillation ([place])   . Hepatic AV fistula ([place]) 08/09/2015  . 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 Anemia due to chronic blood loss 08/09/2015  . Right heart failure ([place]) 08/09/2015  . Tricuspid regurgitation 08/09/2015  . Melanoma ([place])     2 on nose, 1 on back, 1 on leg  . Diarrhea 05/09/2016    Past Surgical History  Procedure Laterality Date  . Pacemaker insertion    . Colon surgery  1196  . Esophagogastroduodenoscopy N/A 08/13/2015    Procedure: ESOPHAGOGASTRODUODENOSCOPY (EGD);  Surgeon: Ladene Artist, MD;  Location: [facility] ENDOSCOPY;  Service: Endoscopy;  Laterality: N/A;  . Tonsillectomy Bilateral   . Esophagogastroduodenoscopy N/A 05/01/2016    Procedure: ESOPHAGOGASTRODUODENOSCOPY (EGD);  Surgeon: Milus Banister, MD;  Location: [place];  Service: Endoscopy;  Laterality: N/A;  . Nasal endoscopy with epistaxis control N/A 05/03/2016    Procedure: NASAL  ENDOSCOPY WITH EPISTAXIS CONTROL;  Surgeon: Melida Quitter, MD;  Location: [place];  Service: ENT;  Laterality: N/A;  Dr Oretha Caprice to follow with upper endoscopy  . Esophagogastroduodenoscopy N/A 05/03/2016    Procedure: ESOPHAGOGASTRODUODENOSCOPY (EGD);  Surgeon: Milus Banister, MD;  Location: [place];  Service: Gastroenterology;  Laterality: N/A;    Family History  Problem Relation Age of Onset  . Other Mother     HHT  . Other Son     HHT  . Heart failure Father   . Heart disease Sister     afib, pacer  . Arthritis Brother     b/l TKR  . Other Brother   . Heart disease Sister     pacer    Social History   Social History  . Marital Status: Married    Spouse Name: N/A  . Number of Children: 1  . Years of Education: N/A   Occupational History  . retired    Social History Main Topics  . Smoking status: Never Smoker   . Smokeless tobacco: Never Used  . Alcohol Use: No  . Drug Use: No  . 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 Not on file   Social History Narrative   Married 1 son   Retired Dealer  Futures trader   One caffeinated beverage a day no alcohol   He was living in [facility] the [place] on [address] in [place] and has located to [place] recently.   08/09/2015       Outpatient Prescriptions Prior to Visit  Medication Sig Dispense Refill  . b complex-vitamin c-folic acid (NEPHRO-VITE) 0.8 MG TABS tablet Take 1 tablet by mouth daily. 30 tablet 6  . cyanocobalamin (,VITAMIN B-12,) 1000 MCG/ML injection Inject 1,000 mcg into the muscle every 30 (thirty) days. 1st of every month    . feeding supplement, ENSURE ENLIVE, (ENSURE ENLIVE) LIQD Take 237 mLs by mouth 2 (two) times daily between meals. 237 mL 12  . feeding supplement, ENSURE ENLIVE, (ENSURE ENLIVE) LIQD Take 237 mLs by mouth 2 (two) times daily between  meals. 237 mL 12  . ferrous sulfate 325 (65 FE) MG EC tablet Take 325 mg by mouth 2 (two) times daily with a meal.     . KLOR-CON M20 20 MEQ tablet TAKE TWO TABLETS BY MOUTH IN THE MORNING AND ONE TABLET IN THE EVENING 90 tablet 1  . lactulose (CHRONULAC) 10 GM/15ML solution Take 45 mLs (30 g total) by mouth 3 (three) times daily. Titrate to 3-4 BM's day 240 mL 3  . Magnesium 250 MG TABS Take 250 mg by mouth daily.     . metolazone (ZAROXOLYN) 2.5 MG tablet Take 1 tablet (2.5 mg total) by mouth as needed (for weight of 171 lb or greater). 30 tablet 1  . ondansetron (ZOFRAN) 4 MG tablet Take 1 tablet (4 mg total) by mouth every 8 (eight) hours as needed for nausea or vomiting. 30 tablet 5  . oxymetazoline (AFRIN) 0.05 % nasal spray Place 1 spray into both nostrils 2 (two) times daily. 30 mL 0  . pantoprazole (PROTONIX) 40 MG tablet TAKE ONE TABLET BY MOUTH ONCE DAILY 30 tablet 0  . promethazine (PHENERGAN) 12.5 MG tablet Take 0.5 tablets (6.25 mg total) by mouth every 8 (eight) hours as needed for nausea or vomiting. (Patient taking differently: Take 6.25 mg by mouth daily as needed for nausea or vomiting. ) 30 tablet 0  . rifaximin (XIFAXAN) 550 MG TABS tablet Take 1 tablet (550 mg total) by mouth 2 (two) times daily. 60 tablet 1  . saline (AYR) GEL Place 1 application into the nose 2 (two) times daily as needed (for nose bleeds).    [name] spironolactone (ALDACTONE) 50 MG tablet Take 50 mg (1 tab) in am and 25 mg (1/2 tab) in pm. 45 tablet 6  . SYRINGE-NEEDLE, DISP, 3 ML 25G X 5/8" 3 ML MISC To use with B12 injections 50 each 2  . torsemide (DEMADEX) 10 MG tablet Take 2 tablets (20 mg total) by mouth 2 (two) times daily. 120 tablet 1  . amoxicillin-clavulanate (AUGMENTIN) 500-125 MG tablet Take 1 tablet (500 mg total) by mouth every 12 (twelve) hours. 10 tablet 0  . digoxin (LANOXIN) 0.125 MG tablet Take 1 tablet (0.125 mg total) by mouth 3 (three) times a week. Mon, Wed, Fri 15 tablet 6   No  facility-administered medications prior to visit.    No Known Allergies  Review of Systems  Constitutional: Positive for malaise/fatigue. Negative for fever.  HENT: Negative for congestion.   Eyes: Negative for blurred vision.  Respiratory: Negative for shortness of breath.   Cardiovascular: Negative for chest pain, palpitations and leg swelling.  Gastrointestinal: Positive for diarrhea. Negative for nausea, abdominal pain and  blood in stool.  Genitourinary: Negative for dysuria and frequency.  Musculoskeletal: Negative for falls.  Skin: Negative for rash.  Neurological: Positive for weakness. Negative for dizziness, loss of consciousness and headaches.  Endo/Heme/Allergies: Negative for environmental allergies.  Psychiatric/Behavioral: Negative for depression. The patient is not nervous/anxious.        Objective:    Physical Exam  Constitutional: He is oriented to person, place, and time. He appears well-developed and well-nourished. No distress.  HENT:  Head: Normocephalic and atraumatic.  Nose: Nose normal.  Eyes: Right eye exhibits no discharge. Left eye exhibits no discharge.  Neck: Normal range of motion. Neck supple.  Cardiovascular: Normal rate and regular rhythm.   No murmur heard. Pulmonary/Chest: Effort normal and breath sounds normal.  Abdominal: Soft. Bowel sounds are normal. There is no tenderness.  Musculoskeletal: He exhibits no edema.  Neurological: He is alert and oriented to person, place, and time.  Skin: Skin is warm and dry.  Psychiatric: He has a normal mood and affect.  Nursing note and vitals reviewed.   BP 104/62 mmHg  Pulse 90  Temp(Src) 98.2 F (36.8 C) (Oral)  Ht 6' (1.829 m)  Wt 174 lb 8 oz (79.153 kg)  BMI 23.66 kg/m2  SpO2 97% Wt Readings from Last 3 Encounters:  05/09/16 174 lb 8 oz (79.153 kg)  05/05/16 174 lb 11.2 oz (79.243 kg)  04/21/16 170 lb 6.4 oz (77.293 kg)     Lab Results  Component Value Date   WBC 5.7 05/05/2016    HGB 9.1* 05/05/2016   HCT 30.3* 05/05/2016   PLT 63* 05/05/2016   GLUCOSE 105* 05/05/2016   ALT 24 05/05/2016   AST 38 05/05/2016   NA 135 05/05/2016   K 3.9 05/05/2016   CL 103 05/05/2016   CREATININE 2.17* 05/05/2016   BUN 50* 05/05/2016   CO2 25 05/05/2016   TSH 5.792* 04/30/2016   INR 1.33 05/01/2016    Lab Results  Component Value Date   TSH 5.792* 04/30/2016   Lab Results  Component Value Date   WBC 5.7 05/05/2016   HGB 9.1* 05/05/2016   HCT 30.3* 05/05/2016   MCV 90.7 05/05/2016   PLT 63* 05/05/2016   Lab Results  Component Value Date   NA 135 05/05/2016   K 3.9 05/05/2016   CO2 25 05/05/2016   GLUCOSE 105* 05/05/2016   BUN 50* 05/05/2016   CREATININE 2.17* 05/05/2016   BILITOT 3.5* 05/05/2016   ALKPHOS 143* 05/05/2016   AST 38 05/05/2016   ALT 24 05/05/2016   PROT 4.8* 05/05/2016   ALBUMIN 2.6* 05/05/2016   CALCIUM 8.4* 05/05/2016   ANIONGAP 7 05/05/2016   GFR 35.09* 03/02/2016      Assessment & Plan:   Problem List Items Addressed This Visit    Epistaxis    Only a small bleed since coming out of the hospital and being scoped and treated by ENT Dr Redmond Baseman      Diarrhea - Primary    Since starting Augmentin multiple episodes a day. No fevers or respiratory symptoms have developed despite concerns for aspiration. Will stop Augmentin and they will report any new symptoms. If diarrhea persists is sent home with receptacle to capture a CDiff culture. Start NOW probiotics daily      Relevant Orders   Clostridium Difficile by PCR   Comprehensive metabolic panel   Magnesium   Anemia due to chronic blood loss    Was recently hospitalized with GI bleeds, was scoped by  GI Dr Ardis Hughs and no noted bleeding since coming home. Will check CBC and ferritin       Other Visit Diagnoses    Anemia, unspecified anemia type        Relevant Orders    CBC    Ferritin       I have discontinued Mr. Braley's amoxicillin-clavulanate. I am also having him maintain  his ferrous sulfate, Magnesium, feeding supplement (ENSURE ENLIVE), b complex-vitamin c-folic acid, SYRINGE-NEEDLE (DISP) 3 ML, spironolactone, cyanocobalamin, metolazone, KLOR-CON M20, pantoprazole, lactulose, promethazine, saline, feeding supplement (ENSURE ENLIVE), oxymetazoline, rifaximin, torsemide, ondansetron, and digoxin.  Meds ordered this encounter  Medications  . digoxin (LANOXIN) 0.125 MG tablet    Sig: Take 1 tablet (0.125 mg total) by mouth 3 (three) times a week. Mon, Wed, Fri    Dispense:  15 tablet    Refill:  6     Penni Homans, MD

## 2016-05-09 NOTE — Assessment & Plan Note (Signed)
Was recently hospitalized with GI bleeds, was scoped by GI Dr Ardis Hughs and no noted bleeding since coming home. Will check CBC and ferritin

## 2016-05-09 NOTE — Assessment & Plan Note (Signed)
Since starting Augmentin multiple episodes a day. No fevers or respiratory symptoms have developed despite concerns for aspiration. Will stop Augmentin and they will report any new symptoms. If diarrhea persists is sent home with receptacle to capture a CDiff culture. Start NOW probiotics daily

## 2016-05-09 NOTE — Assessment & Plan Note (Signed)
Only a small bleed since coming out of the hospital and being scoped and treated by ENT Dr Redmond Baseman

## 2016-05-09 NOTE — Progress Notes (Signed)
Pre visit review using our clinic review tool, if applicable. No additional management support is needed unless otherwise documented below in the visit note. 

## 2016-05-10 LAB — COMPREHENSIVE METABOLIC PANEL
ALBUMIN: 3 g/dL — AB (ref 3.5–5.2)
ALK PHOS: 269 U/L — AB (ref 39–117)
ALT: 41 U/L (ref 0–53)
AST: 66 U/L — ABNORMAL HIGH (ref 0–37)
BUN: 66 mg/dL — ABNORMAL HIGH (ref 6–23)
CHLORIDE: 98 meq/L (ref 96–112)
CO2: 26 mEq/L (ref 19–32)
Calcium: 8.4 mg/dL (ref 8.4–10.5)
Creatinine, Ser: 2.18 mg/dL — ABNORMAL HIGH (ref 0.40–1.50)
GFR: 31.21 mL/min — AB (ref >60.00)
GLUCOSE: 77 mg/dL (ref 70–99)
POTASSIUM: 4 meq/L (ref 3.5–5.1)
Sodium: 129 mEq/L — ABNORMAL LOW (ref 135–145)
TOTAL PROTEIN: 4.9 g/dL — AB (ref 6.0–8.3)
Total Bilirubin: 2.4 mg/dL — ABNORMAL HIGH (ref 0.2–1.2)

## 2016-05-10 LAB — CBC
MCHC: 32.3 g/dL (ref 30.0–36.0)
MCV: 86.9 fl (ref 78.0–100.0)
PLATELETS: 68 10*3/uL — AB (ref 150.0–400.0)
RBC: 2.87 Mil/uL — ABNORMAL LOW (ref 4.22–5.81)
RDW: 20.2 % — ABNORMAL HIGH (ref 11.5–15.5)
WBC: 5.4 10*3/uL (ref 4.0–10.5)

## 2016-05-10 LAB — MAGNESIUM: MAGNESIUM: 2.3 mg/dL (ref 1.5–2.5)

## 2016-05-10 LAB — FERRITIN: Ferritin: 33.8 ng/mL (ref 22.0–322.0)

## 2016-05-11 ENCOUNTER — Other Ambulatory Visit: Payer: Self-pay | Admitting: Family Medicine

## 2016-05-11 DIAGNOSIS — D509 Iron deficiency anemia, unspecified: Secondary | ICD-10-CM

## 2016-05-15 ENCOUNTER — Other Ambulatory Visit (INDEPENDENT_AMBULATORY_CARE_PROVIDER_SITE_OTHER): Payer: Medicare Other

## 2016-05-15 ENCOUNTER — Telehealth: Payer: Self-pay | Admitting: Family Medicine

## 2016-05-15 DIAGNOSIS — D509 Iron deficiency anemia, unspecified: Secondary | ICD-10-CM

## 2016-05-15 LAB — CBC WITH DIFFERENTIAL/PLATELET
BASOS ABS: 0 10*3/uL (ref 0.0–0.1)
Basophils Relative: 0.4 % (ref 0.0–3.0)
EOS PCT: 0.8 % (ref 0.0–5.0)
Eosinophils Absolute: 0 10*3/uL (ref 0.0–0.7)
HCT: 21.5 % — CL (ref 39.0–52.0)
LYMPHS ABS: 0.3 10*3/uL — AB (ref 0.7–4.0)
Lymphocytes Relative: 8 % — ABNORMAL LOW (ref 12.0–46.0)
MCHC: 32.4 g/dL (ref 30.0–36.0)
MCV: 85.8 fl (ref 78.0–100.0)
MONOS PCT: 14.7 % — AB (ref 3.0–12.0)
Monocytes Absolute: 0.6 10*3/uL (ref 0.1–1.0)
NEUTROS PCT: 76.1 % (ref 43.0–77.0)
Neutro Abs: 3.2 10*3/uL (ref 1.4–7.7)
Platelets: 83 10*3/uL — ABNORMAL LOW (ref 150.0–400.0)
RBC: 2.5 Mil/uL — AB (ref 4.22–5.81)
RDW: 19.7 % — ABNORMAL HIGH (ref 11.5–15.5)
WBC: 4.2 10*3/uL (ref 4.0–10.5)

## 2016-05-15 NOTE — Telephone Encounter (Signed)
Please arrange for outpatient transfusion of 2 units PRBCs r

## 2016-05-15 NOTE — Telephone Encounter (Signed)
Critical Lab Hemoglobin 7.0 Hematocrit 21.5

## 2016-05-15 NOTE — Telephone Encounter (Signed)
Called the wife. The patient had a bad nose bleed on 05/10/2016, was seen by his ENT the next day.  He has had no other bleeding and feeling pretty well today. The wife is very flexible to schedule a transfusion if needed. Tuesday is not the best day for her, but if PCP feels best for the patient she will work it out.  PLEASE do not schedule on Friday.

## 2016-05-16 NOTE — Telephone Encounter (Signed)
[facility] short stay is faxing a form to complete, then will schedule asap.

## 2016-05-16 NOTE — Telephone Encounter (Signed)
Spoke to [facility] and they will be able to schedule this coming Thursday.  PCP as well spoke to cone regarding form instructions. Wife/patient ok with cone. PCP will complete form then we will schedule.

## 2016-05-16 NOTE — Telephone Encounter (Signed)
Scheduled patient for Transfusion at [facility] for Thursday at 11 AM on June 8, type and cross at 9 AM at [facility] on Wednesday the 7th. Form  Faxed to [place] at [facility] at [phone] Attn: Laverne. Wife/patient did agree to the benefits of this procedure, risks, side effects, alternatives, likelihood of achieving goals and potential problems during the recovery for this procedure.  Both agreed and understood. The wife did understand/agree to scheduled appointment/where to go/arrival time.  Will forward message to PCP to sign off.

## 2016-05-17 ENCOUNTER — Encounter (HOSPITAL_COMMUNITY)
Admission: RE | Admit: 2016-05-17 | Discharge: 2016-05-17 | Disposition: A | Discharge status: Home / Self Care | Payer: Medicare Other | Source: Ambulatory Visit | Attending: Internal Medicine | Admitting: Internal Medicine

## 2016-05-17 DIAGNOSIS — D509 Iron deficiency anemia, unspecified: Secondary | ICD-10-CM | POA: Diagnosis not present

## 2016-05-17 LAB — PREPARE RBC (CROSSMATCH)

## 2016-05-18 ENCOUNTER — Encounter (HOSPITAL_COMMUNITY)
Admission: RE | Admit: 2016-05-18 | Discharge: 2016-05-18 | Disposition: A | Discharge status: Home / Self Care | Payer: Medicare Other | Source: Ambulatory Visit | Attending: Family Medicine | Admitting: Family Medicine

## 2016-05-18 DIAGNOSIS — D509 Iron deficiency anemia, unspecified: Secondary | ICD-10-CM | POA: Diagnosis not present

## 2016-05-18 MED ORDER — FUROSEMIDE 10 MG/ML IJ SOLN
INTRAMUSCULAR | Status: AC
  Administered 2016-05-18: 20 mg via INTRAVENOUS
  Filled 2016-05-18: qty 2

## 2016-05-18 MED ORDER — SODIUM CHLORIDE 0.9 % IV SOLN
Freq: Once | INTRAVENOUS | Status: AC
  Administered 2016-05-18: 12:00:00 via INTRAVENOUS

## 2016-05-18 MED ORDER — FUROSEMIDE 10 MG/ML IJ SOLN
20.0000 mg | Freq: Once | INTRAMUSCULAR | Status: AC
  Administered 2016-05-18: 20 mg via INTRAVENOUS

## 2016-05-19 ENCOUNTER — Telehealth: Payer: Self-pay | Admitting: Family Medicine

## 2016-05-19 LAB — TYPE AND SCREEN
ABO/RH(D): A POS
Antibody Screen: NEGATIVE
UNIT DIVISION: 0
Unit division: 0

## 2016-05-19 NOTE — Telephone Encounter (Signed)
Yes please give verbal order for CBC every other week

## 2016-05-19 NOTE — Telephone Encounter (Signed)
Caller name: Amy, RN with [facility] Can be reached: [phone]   Reason for call: She is calling to ask if [facility] should monitor/draw labs in the home biweekly to monitor hemoglobin. Please return call.

## 2016-05-19 NOTE — Telephone Encounter (Signed)
HHRN informed of PCP verbal ok 

## 2016-05-24 ENCOUNTER — Other Ambulatory Visit (HOSPITAL_COMMUNITY): Payer: Self-pay | Admitting: Internal Medicine

## 2016-05-25 ENCOUNTER — Telehealth: Payer: Self-pay | Admitting: *Deleted

## 2016-05-25 NOTE — Telephone Encounter (Signed)
Repeat CBCD in am

## 2016-05-25 NOTE — Telephone Encounter (Signed)
Caller: Darryll Capers, nurse manager w/ [place] #: [phone], ext. [phone]   Platelets: 49 (labs drawn 05/24/16)

## 2016-05-25 NOTE — Telephone Encounter (Signed)
His platelets normally run low but this is lower than usual--- does he have any bleeding? May need to go to ER

## 2016-05-25 NOTE — Telephone Encounter (Signed)
[facility], who recommended I call pt's HHRN, Amy Chavis at [phone]. Called Amy and LMOM to return call and let us know if pt is experiencing bleeding/symptoms r/t low platelets.  12:30 PM Spoke w/ Amy, per pt's wife he is having no bleeding and no symptoms. He reports he feels fine. He received outpatient transfusion of 2 units PRBC on 05/18/16. Called [place] and [facility] for H/H results from yesterday's labs; awaiting call back.

## 2016-05-25 NOTE — Telephone Encounter (Signed)
Verbal orders called to [facility]. They will fax results.

## 2016-05-25 NOTE — Telephone Encounter (Signed)
Fax results received, placed in Dr. Nonda Lou red folder for review.  Hemoglobin: 7.9 Hct: 25.1 Plt: 49  Again, pt is asymptomatic and has no bleeding. H/H appears somewhat close to pt's baseline, however it is difficult for me to discern baseline given his multiple hospitalizations and transfusions within the last 6 weeks. He has dx of HHT, epistaxis, and anemia d/t chronic blood loss. Please advise if ED eval is needed or other intervention.

## 2016-05-30 ENCOUNTER — Telehealth: Payer: Self-pay | Admitting: Family Medicine

## 2016-05-30 NOTE — Telephone Encounter (Signed)
Caller name: Mrs. Sare  Relation to [phone]  Call back [place]:  Reason for call:  Spouse in need of clinical advice regarding patient buttocks and states the OTC ointment is not working. Please advise

## 2016-05-31 MED ORDER — GERHARDT'S BUTT CREAM: 1.0000 "application " | TOPICAL_CREAM | Freq: Three times a day (TID) | CUTANEOUS | Status: AC

## 2016-05-31 NOTE — Telephone Encounter (Signed)
Please send them in some Nystatin cream to apply tid as this could easily be some yeast. If they are able to try it before their appt we can know if it tolerated so we can discuss further options at visit.

## 2016-05-31 NOTE — Telephone Encounter (Signed)
Noted. Called wife and discussed provider's recommendations.  She is in agreement with plan.  Rx sent to pharmacy.  No further needs at this time.  They plans to keep appt as scheduled.

## 2016-05-31 NOTE — Telephone Encounter (Signed)
Not sure what his concerns are at this point. Is it actual skin breakdown, pain, swelling and redness. Once I know more I can recommend a treatment.

## 2016-05-31 NOTE — Telephone Encounter (Signed)
Caller name: Mrs. Hommerding  Relation to [phone]  Call back [phone]  Returning your call

## 2016-05-31 NOTE — Telephone Encounter (Signed)
Wife states that patient was in the hospital a few weeks ago for Aspiration Pneumonia.  He was given Amoxicillin that caused him to have diarrhea.  As a result of diarrhea, pt developed some redness and soreness on his bottom.  She says at discharge they were given a barrier to apply and the barrier was used, however skin irritation is getting progressive worse.  The area is now red, sore, raised and blistered.  She has tried [place] and A & D ointment and nothing is helping.  He is no longer experiencing diarrhea.  However, pt is sitting a lot, but is able to walk with a walker.  She called yesterday, because patient was getting uncomfortable.  She was advised to have patient shift himself in chair often and to place a very thin pillow under one side of his bottom and rotate pillow every hour to relieve some pressure.  Also advised that a message would be sent to provider for further suggestions.  Wife states that patient has an appt tomorrow with Dr. Charlett Blake if she wants to wait until she sees him (06/01/16 @ 2 pm).  Please advise.

## 2016-05-31 NOTE — Telephone Encounter (Signed)
Called to follow up.  No answer.  Left a message for call back.

## 2016-06-01 ENCOUNTER — Ambulatory Visit (INDEPENDENT_AMBULATORY_CARE_PROVIDER_SITE_OTHER): Payer: Medicare Other | Admitting: Family Medicine

## 2016-06-01 ENCOUNTER — Encounter: Payer: Self-pay | Admitting: Family Medicine

## 2016-06-01 VITALS — BP 102/68 | HR 108 | Temp 97.6°F | Wt 177.0 lb

## 2016-06-01 DIAGNOSIS — E871 Hypo-osmolality and hyponatremia: Secondary | ICD-10-CM

## 2016-06-01 DIAGNOSIS — R110 Nausea: Secondary | ICD-10-CM

## 2016-06-01 DIAGNOSIS — D6489 Other specified anemias: Secondary | ICD-10-CM | POA: Diagnosis not present

## 2016-06-01 DIAGNOSIS — B029 Zoster without complications: Secondary | ICD-10-CM

## 2016-06-01 DIAGNOSIS — D649 Anemia, unspecified: Secondary | ICD-10-CM | POA: Diagnosis not present

## 2016-06-01 LAB — RETICULOCYTES
ABS Retic: 103740 cells/uL — ABNORMAL HIGH (ref 25000–90000)
RBC.: 2.73 MIL/uL — ABNORMAL LOW (ref 4.20–5.80)
RETIC CT PCT: 3.8 %

## 2016-06-01 MED ORDER — NEPHRO-VITE 0.8 MG PO TABS: 1.0000 | ORAL_TABLET | Freq: Every day | ORAL | Status: AC

## 2016-06-01 NOTE — Telephone Encounter (Signed)
Faxed hardcopy for Cream to [facility] in [place]

## 2016-06-01 NOTE — Progress Notes (Signed)
Pre visit review using our clinic review tool, if applicable. No additional management support is needed unless otherwise documented below in the visit note. 

## 2016-06-01 NOTE — Patient Instructions (Signed)
Nausea, Adult °Nausea is the feeling that you have an upset stomach or have to vomit. Nausea by itself is not likely a serious concern, but it may be an early sign of more serious medical problems. As nausea gets worse, it can lead to vomiting. If vomiting develops, there is the risk of dehydration.  °CAUSES  °· Viral infections. °· Food poisoning. °· Medicines. °· Pregnancy. °· Motion sickness. °· Migraine headaches. °· Emotional distress. °· Severe pain from any source. °· Alcohol intoxication. °HOME CARE INSTRUCTIONS °· Get plenty of rest. °· Ask your caregiver about specific rehydration instructions. °· Eat small amounts of food and sip liquids more often. °· Take all medicines as told by your caregiver. °SEEK MEDICAL CARE IF: °· You have not improved after 2 days, or you get worse. °· You have a headache. °SEEK IMMEDIATE MEDICAL CARE IF:  °· You have a fever. °· You faint. °· You keep vomiting or have blood in your vomit. °· You are extremely weak or dehydrated. °· You have dark or bloody stools. °· You have severe chest or abdominal pain. °MAKE SURE YOU: °· Understand these instructions. °· Will watch your condition. °· Will get help right away if you are not doing well or get worse. °  °This information is not intended to replace advice given to you by your health care provider. Make sure you discuss any questions you have with your health care provider. °  °Document Released: 01/04/2005 Document Revised: 12/18/2014 Document Reviewed: 08/09/2011 °Elsevier Interactive Patient Education ©2016 Elsevier Inc. ° °

## 2016-06-02 ENCOUNTER — Other Ambulatory Visit: Payer: Self-pay | Admitting: Family Medicine

## 2016-06-02 ENCOUNTER — Telehealth: Payer: Self-pay | Admitting: Emergency Medicine

## 2016-06-02 DIAGNOSIS — D6489 Other specified anemias: Secondary | ICD-10-CM

## 2016-06-02 LAB — COMPREHENSIVE METABOLIC PANEL
ALK PHOS: 205 U/L — AB (ref 39–117)
ALT: 27 U/L (ref 0–53)
AST: 39 U/L — AB (ref 0–37)
Albumin: 3 g/dL — ABNORMAL LOW (ref 3.5–5.2)
BILIRUBIN TOTAL: 2.5 mg/dL — AB (ref 0.2–1.2)
BUN: 47 mg/dL — AB (ref 6–23)
CO2: 24 meq/L (ref 19–32)
Calcium: 8.4 mg/dL (ref 8.4–10.5)
Chloride: 94 mEq/L — ABNORMAL LOW (ref 96–112)
Creatinine, Ser: 1.99 mg/dL — ABNORMAL HIGH (ref 0.40–1.50)
GFR: 34.66 mL/min — AB (ref >60.00)
GLUCOSE: 116 mg/dL — AB (ref 70–99)
Potassium: 5 mEq/L (ref 3.5–5.1)
SODIUM: 123 meq/L — AB (ref 135–145)
TOTAL PROTEIN: 5.1 g/dL — AB (ref 6.0–8.3)

## 2016-06-02 LAB — CBC WITH DIFFERENTIAL/PLATELET
BASOS ABS: 0 10*3/uL (ref 0.0–0.1)
Basophils Relative: 0.6 % (ref 0.0–3.0)
EOS ABS: 0 10*3/uL (ref 0.0–0.7)
Eosinophils Relative: 0.4 % (ref 0.0–5.0)
HCT: 23.3 % — CL (ref 39.0–52.0)
Hemoglobin: 7.6 g/dL — CL (ref 13.0–17.0)
LYMPHS ABS: 0.2 10*3/uL — AB (ref 0.7–4.0)
Lymphocytes Relative: 5.5 % — ABNORMAL LOW (ref 12.0–46.0)
MCHC: 32.6 g/dL (ref 30.0–36.0)
MCV: 84.3 fl (ref 78.0–100.0)
MONO ABS: 0.7 10*3/uL (ref 0.1–1.0)
NEUTROS ABS: 2.9 10*3/uL (ref 1.4–7.7)
NEUTROS PCT: 75.1 % (ref 43.0–77.0)
PLATELETS: 56 10*3/uL — AB (ref 150.0–400.0)
RBC: 2.77 Mil/uL — AB (ref 4.22–5.81)
RDW: 18.7 % — ABNORMAL HIGH (ref 11.5–15.5)
WBC: 3.9 10*3/uL — ABNORMAL LOW (ref 4.0–10.5)

## 2016-06-02 NOTE — Telephone Encounter (Signed)
Gill from [facility] called critical Hgb 7.6 & Hct 23.3 for this patient....KMP

## 2016-06-02 NOTE — Telephone Encounter (Signed)
PCP informed of result. Called the patient and spoke to his wife [name], did give him PCP instructions. Scheduled to recheck lab on Tuesday 06/06/2016.

## 2016-06-05 ENCOUNTER — Other Ambulatory Visit (HOSPITAL_COMMUNITY): Payer: Self-pay | Admitting: Internal Medicine

## 2016-06-05 ENCOUNTER — Other Ambulatory Visit: Payer: Self-pay | Admitting: Family Medicine

## 2016-06-05 ENCOUNTER — Telehealth: Payer: Self-pay | Admitting: Family Medicine

## 2016-06-05 NOTE — Telephone Encounter (Signed)
Caller name: Relationship to patient: Pharmacy  Pharmacy:  [facility] [address] ([address] [place] [id number] (Phone) [phone] (Fax)         Reason for call: Has question about a Rx for Lactulose

## 2016-06-05 NOTE — Telephone Encounter (Signed)
Clarified amount to last one month

## 2016-06-06 ENCOUNTER — Telehealth (HOSPITAL_COMMUNITY): Payer: Self-pay | Admitting: *Deleted

## 2016-06-06 ENCOUNTER — Other Ambulatory Visit (INDEPENDENT_AMBULATORY_CARE_PROVIDER_SITE_OTHER): Payer: Medicare Other

## 2016-06-06 ENCOUNTER — Encounter: Payer: Self-pay | Admitting: Family Medicine

## 2016-06-06 ENCOUNTER — Other Ambulatory Visit: Payer: Self-pay | Admitting: Family Medicine

## 2016-06-06 ENCOUNTER — Telehealth: Payer: Self-pay | Admitting: Emergency Medicine

## 2016-06-06 DIAGNOSIS — D6489 Other specified anemias: Secondary | ICD-10-CM | POA: Diagnosis not present

## 2016-06-06 DIAGNOSIS — D649 Anemia, unspecified: Secondary | ICD-10-CM

## 2016-06-06 DIAGNOSIS — D61810 Antineoplastic chemotherapy induced pancytopenia: Secondary | ICD-10-CM

## 2016-06-06 DIAGNOSIS — T451X5A Adverse effect of antineoplastic and immunosuppressive drugs, initial encounter: Secondary | ICD-10-CM

## 2016-06-06 LAB — COMPREHENSIVE METABOLIC PANEL
ALK PHOS: 196 U/L — AB (ref 39–117)
ALT: 23 U/L (ref 0–53)
AST: 35 U/L (ref 0–37)
Albumin: 2.9 g/dL — ABNORMAL LOW (ref 3.5–5.2)
BILIRUBIN TOTAL: 2.1 mg/dL — AB (ref 0.2–1.2)
BUN: 47 mg/dL — AB (ref 6–23)
CO2: 25 mEq/L (ref 19–32)
CREATININE: 1.91 mg/dL — AB (ref 0.40–1.50)
Calcium: 8.6 mg/dL (ref 8.4–10.5)
Chloride: 100 mEq/L (ref 96–112)
GFR: 36.34 mL/min — AB (ref >60.00)
GLUCOSE: 156 mg/dL — AB (ref 70–99)
Potassium: 5.6 mEq/L — ABNORMAL HIGH (ref 3.5–5.1)
Sodium: 129 mEq/L — ABNORMAL LOW (ref 135–145)
TOTAL PROTEIN: 5.3 g/dL — AB (ref 6.0–8.3)

## 2016-06-06 LAB — CBC WITH DIFFERENTIAL/PLATELET
BASOS ABS: 0 10*3/uL (ref 0.0–0.1)
Basophils Relative: 0.2 % (ref 0.0–3.0)
EOS ABS: 0 10*3/uL (ref 0.0–0.7)
Eosinophils Relative: 0.2 % (ref 0.0–5.0)
HCT: 22.3 % — CL (ref 39.0–52.0)
Hemoglobin: 7.2 g/dL — CL (ref 13.0–17.0)
LYMPHS ABS: 0.3 10*3/uL — AB (ref 0.7–4.0)
Lymphocytes Relative: 6.2 % — ABNORMAL LOW (ref 12.0–46.0)
MCHC: 32.1 g/dL (ref 30.0–36.0)
MCV: 85.3 fl (ref 78.0–100.0)
MONO ABS: 0.4 10*3/uL (ref 0.1–1.0)
MONOS PCT: 10.8 % (ref 3.0–12.0)
NEUTROS PCT: 82.6 % — AB (ref 43.0–77.0)
Neutro Abs: 3.4 10*3/uL (ref 1.4–7.7)
Platelets: 67 10*3/uL — ABNORMAL LOW (ref 150.0–400.0)
RDW: 18.5 % — ABNORMAL HIGH (ref 11.5–15.5)
WBC: 4.2 10*3/uL (ref 4.0–10.5)

## 2016-06-06 MED ORDER — TORSEMIDE 10 MG PO TABS: 20.0000 mg | ORAL_TABLET | Freq: Two times a day (BID) | ORAL | Status: DC

## 2016-06-06 NOTE — Telephone Encounter (Signed)
[id number] from [facility] called Critical Hgb 7.2 & Hct 22.3 @ 1453.Marland KitchenMarland KitchenKMP

## 2016-06-06 NOTE — Telephone Encounter (Signed)
The wife states she would like a referral to hematology.  The wife stated he is not quite as strong as he was.  She is concerned about his sodium, she has decrease the diuretics (per PCP instructions) due to sodium, but may causing swelling around abdomen and legs.   BP today 89/52, lungs clear but diminished,  99% oxygen, heart rate 84 (HHRN today).

## 2016-06-06 NOTE — Telephone Encounter (Signed)
His sodium is improved. Just seeing this. He may need to get looked since he is worsening. We can arrange another outpatient transfusion while he waits for hematology referral that would help his weakness.

## 2016-06-06 NOTE — Telephone Encounter (Signed)
Pts HHRN called and stated he was having significant increase shortness of breath and increased edema in feet, abdomen, and neck.  Dr.Blythe decreased his torsemide from 80bid to 40bid bc of critical sodium,hemoglobin, and platelet labs.  Spoke with Dr.Bensimhon who advised pt to take metolazone 2.5mg  for two days and increase torsemide to 80mg  AM and 40 mg PM. Aimee [facility]) notified medications updated in patients chart.

## 2016-06-07 ENCOUNTER — Telehealth (HOSPITAL_COMMUNITY): Payer: Self-pay | Admitting: Vascular Surgery

## 2016-06-07 ENCOUNTER — Other Ambulatory Visit (HOSPITAL_COMMUNITY): Payer: Self-pay | Admitting: *Deleted

## 2016-06-07 MED ORDER — TORSEMIDE 20 MG PO TABS: ORAL_TABLET | ORAL | Status: DC

## 2016-06-07 NOTE — Telephone Encounter (Signed)
Pt wife called wanting a ASAP w/ DB,

## 2016-06-07 NOTE — Telephone Encounter (Signed)
Spoke w/ pt's wife. They had spoken w/ Dr. Jeffie Pollock yesterday and he increased pt's torsemide to 80 mg in the morning and 40 mg in the evening and increased metazolone to 2.5mg  for two days. Pt's wife reports that he is already doing much better. He has lost 2 lbs, his swelling is improved, he is no longer SOB, and and he has more energy. Offered appt w/ Dr. Charlett Blake tomorrow, but she feels that is doing well enough at this point to hold off. She is planning to schedule follow-up appt w/ Dr. Jeffie Pollock and has appt w/ Dr. Charlett Blake on 06/22/16. HHRN is coming to do blood work tomorrow per Dr. Jeffie Pollock, and she would like to wait for these results before scheduling outpatient transfusion.

## 2016-06-07 NOTE — Telephone Encounter (Signed)
Left voicemail for pts wife to call back to try and get patient an appointment,

## 2016-06-07 NOTE — Telephone Encounter (Signed)
See related phone note. Medications adjusted yesterday per Dr. Jeffie Pollock, pt is feeling much better. They will schedule f/u w/ Dr. Jeffie Pollock and f/u w/ Dr. Charlett Blake as scheduled. Will hold on outpatient transfusion until receiving results from blood work that is to be drawn tomorrow.

## 2016-06-08 ENCOUNTER — Encounter: Payer: Self-pay | Admitting: Family Medicine

## 2016-06-08 ENCOUNTER — Telehealth: Payer: Self-pay | Admitting: Family Medicine

## 2016-06-08 NOTE — Telephone Encounter (Signed)
Called sickle Cell at [place] and spoke to [place]. Faxed order to Attn Tiffany at [phone] Patient is scheduled for transfusion in the morning at [facility] sickle Cell at 8 AM.  Wife informed and ok to do.  The wife would like referral to be re entered for hematology please as they do want this referral.

## 2016-06-08 NOTE — Telephone Encounter (Signed)
Spoke with Aimee with [facility] today pts hemoglobin is 6.9 Dr.Blythe is working on outpatient transfusion for pt. Dr.Bensimhon is ok with Dr.Blythe referring pt to a hematologist.  Aimee will contact Dr.Blythe and communicate this to pts wife.  After transfusion we will schedule follow up visit.

## 2016-06-08 NOTE — Telephone Encounter (Signed)
Caller name: Amy from [facility] Relation to pt: RN Call back number: [phone]   Reason for call:  RN wanted to inform PCP that patient had a critical hemglobin of 6.9 ordered by Dr. Haroldine Laws cardiologist. Please advise

## 2016-06-08 NOTE — Telephone Encounter (Signed)
Will need an outpatient transfusion of 2 units PRBCs again. Please initiate next steps

## 2016-06-09 ENCOUNTER — Other Ambulatory Visit (HOSPITAL_COMMUNITY): Payer: Self-pay | Admitting: Hematology

## 2016-06-09 ENCOUNTER — Ambulatory Visit (HOSPITAL_COMMUNITY)
Admission: RE | Admit: 2016-06-09 | Discharge: 2016-06-09 | Disposition: A | Discharge status: Home / Self Care | Payer: Medicare Other | Source: Ambulatory Visit | Attending: Family Medicine | Admitting: Family Medicine

## 2016-06-09 VITALS — BP 110/54 | HR 87 | Temp 97.7°F | Resp 20

## 2016-06-09 DIAGNOSIS — D649 Anemia, unspecified: Secondary | ICD-10-CM

## 2016-06-09 LAB — PREPARE RBC (CROSSMATCH)

## 2016-06-09 MED ORDER — FUROSEMIDE 10 MG/ML IJ SOLN
20.0000 mg | Freq: Once | INTRAMUSCULAR | Status: AC
  Administered 2016-06-09: 20 mg via INTRAVENOUS
  Filled 2016-06-09: qty 2

## 2016-06-09 MED ORDER — SODIUM CHLORIDE 0.9 % IV SOLN: Freq: Once | INTRAVENOUS | Status: DC

## 2016-06-09 NOTE — Procedures (Signed)
[facility]  Procedure Note  Hunter [name] [id number] DOB: Jan 26, 1935 DOA: 06/09/2016   PCP: Penni Homans, MD   Associated Diagnosis: severe anemia  Procedure Note: IV started, 2 units PRBC's transfused per order, IV discontinued   Condition During Procedure: patient stable throughout procedure  Condition at Discharge: patient stable, wife at side for transportation  Roberto Scales, [facility]

## 2016-06-09 NOTE — Discharge Instructions (Signed)
Blood Transfusion   A blood transfusion is a procedure that gives you donated blood through an IV tube. You may need blood because of illness, surgery, or injury. The blood may come from a donor. The blood may also be your own blood that you donated earlier.  The blood you get is made up of different types of cells. You may get:    Red blood cells. These carry oxygen and replace lost blood.    Platelets. These control bleeding.    Plasma. This helps blood to clot.  If you have a clotting disorder, you may also get other types of blood products.   BEFORE THE PROCEDURE   You may have a blood test. This finds out what type of blood you have. It also finds out what kind of blood your body will accept.    If you are going to have a planned surgery, you may donate your own blood. This is done in case you need to have a transfusion.    If you have had an allergic transfusion reaction before, you may be given medicine to help prevent a reaction. Take this medicine only as told by your doctor.   You will have your temperature, blood pressure, and pulse checked.  PROCEDURE    An IV will be started in your hand or arm.    The bag of donated blood will be attached to your IV and run into your vein.    A doctor will regularly check your temperature, blood pressure, and pulse during the procedure. This is done to find any early signs of a transfusion reaction.   If you have any signs or symptoms of a reaction, the procedure may be stopped and you may be given medicine.    When the transfusion is over, your IV will be removed.    Pressure may be applied to the IV site for a few minutes.    A bandage (dressing) will be applied.   The procedure may vary among doctors and hospitals.   AFTER THE PROCEDURE   Your blood pressure, temperature, and pulse will be checked regularly.     This information is not intended to replace advice given to you by your health care provider. Make sure you discuss any questions  you have with your health care provider.     Document Released: 02/23/2009 Document Revised: 12/18/2014 Document Reviewed: 10/07/2014  Elsevier Interactive Patient Education 2016 [employer]

## 2016-06-10 LAB — TYPE AND SCREEN
ABO/RH(D): A POS
Antibody Screen: NEGATIVE
Unit division: 0
Unit division: 0

## 2016-06-11 ENCOUNTER — Encounter: Payer: Self-pay | Admitting: Family Medicine

## 2016-06-11 DIAGNOSIS — R110 Nausea: Secondary | ICD-10-CM

## 2016-06-11 HISTORY — DX: Nausea: R11.0

## 2016-06-11 NOTE — Assessment & Plan Note (Signed)
Will need to monitor, no symptoms at present. Decrease fluid intake slightly and continue diuretics. Repeat labs in 1 week

## 2016-06-11 NOTE — Progress Notes (Signed)
Patient ID: Aamari Strawderman, male   DOB: Mar 29, 1935, 81 y.o.   MRN: [id number]   Subjective:    Patient ID: [name], male    DOB: Sep 28, 1935, 81 y.o.   MRN: [id number]  Chief Complaint  Patient presents with  . Follow-up    HPI Patient is in today for follow up. He is accompanied by his wife. He is feeling somewhat better today than he has been but he does have some cough and nasal congestion no fevers or chills. No sob/ear pain or throat pain. Denies CP/palp/HA/GI or GU c/o. Taking meds as prescribed.  Past Medical History  Diagnosis Date  . CHF (congestive heart failure) ([place])   . Pacemaker     single lead Medtronic pacemaker  . HHT (hereditary hemorrhagic telangiectasia) ([place])   . Anemia   . Pulmonary hypertension ([place])   . 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 Renal insufficiency   . 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   . Atrial fibrillation ([place])   . Hepatic AV fistula ([place]) 08/09/2015  . 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 Anemia due to chronic blood loss 08/09/2015  . Right heart failure ([place]) 08/09/2015  . Tricuspid regurgitation 08/09/2015  . Melanoma ([place])     2 on nose, 1 on back, 1 on leg  . Diarrhea 05/09/2016  . Nausea without vomiting 06/11/2016    Past Surgical History  Procedure Laterality Date  . Pacemaker insertion    . Colon surgery  1196  . Esophagogastroduodenoscopy N/A 08/13/2015    Procedure: ESOPHAGOGASTRODUODENOSCOPY (EGD);  Surgeon: Ladene Artist, MD;  Location[facility] END[facility]COPY;  Service: Endoscopy;  Laterality: N/A;  . Tonsillectomy Bilateral   . Esophagogastroduodenoscopy N/A 05/01/2016    Procedure: ESOPHAGOGASTRODUODENOSCOPY (EGD);  Surgeon: Milus Banister, MD;  Location: [place];  Service: Endoscopy;  Laterality: N/A;  . Nasal endoscopy with epistaxis control N/A 05/03/2016    Procedure: NASAL ENDOSCOPY WITH EPISTAXIS CONTROL;  Surgeon: Melida Quitter, MD;  Location: [place];  Service: ENT;  Laterality: N/A;  Dr Oretha Caprice  to follow with upper endoscopy  . Esophagogastroduodenoscopy N/A 05/03/2016    Procedure: ESOPHAGOGASTRODUODENOSCOPY (EGD);  Surgeon: Milus Banister, MD;  Location: [place];  Service: Gastroenterology;  Laterality: N/A;    Family History  Problem Relation Age of Onset  . Other Mother     HHT  . Other Son     HHT  . Heart failure Father   . Heart disease Sister     afib, pacer  . Arthritis Brother     b/l TKR  . Other Brother   . Heart disease Sister     pacer    Social History   Social History  . Marital Status: Married    Spouse Name: N/A  . Number of Children: 1  . Years of Education: N/A   Occupational History  . retired    Social History Main Topics  . Smoking status: Never Smoker   . Smokeless tobacco: Never Used  . Alcohol Use: No  . Drug Use: No  . 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 Not on file   Social History Narrative   Married 1 son   Retired Therapist, art   One caffeinated beverage a day no alcohol   He was living in [employer]  at the [place] [address] in [place] and has located to [place] recently.   08/09/2015       Outpatient Prescriptions Prior to Visit  Medication Sig Dispense Refill  . cyanocobalamin (,VITAMIN B-12,) 1000 MCG/ML injection Inject 1,000 mcg into the muscle every 30 (thirty) days. 1st of every month    . digoxin (LANOXIN) 0.125 MG tablet Take 1 tablet (0.125 mg total) by mouth 3 (three) times a week. Mon, Wed, Fri 15 tablet 6  . feeding supplement, ENSURE ENLIVE, (ENSURE ENLIVE) LIQD Take 237 mLs by mouth 2 (two) times daily between meals. 237 mL 12  . feeding supplement, ENSURE ENLIVE, (ENSURE ENLIVE) LIQD Take 237 mLs by mouth 2 (two) times daily between meals. 237 mL 12  . ferrous sulfate 325 (65 FE) MG EC tablet Take 325 mg by mouth 2 (two) times daily with a meal.      . Hydrocortisone (GERHARDT'S BUTT CREAM) CREA Apply 1 application topically 3 (three) times daily. 1 each 0  . lactulose (CHRONULAC) 10 GM/15ML solution Take 45 mLs (30 g total) by mouth 3 (three) times daily. Titrate to 3-4 BM's day 240 mL 3  . Magnesium 250 MG TABS Take 250 mg by mouth daily.     . metolazone (ZAROXOLYN) 2.5 MG tablet Take 1 tablet (2.5 mg total) by mouth as needed (for weight of 171 lb or greater). 30 tablet 1  . ondansetron (ZOFRAN) 4 MG tablet Take 1 tablet (4 mg total) by mouth every 8 (eight) hours as needed for nausea or vomiting. 30 tablet 5  . oxymetazoline (AFRIN) 0.05 % nasal spray Place 1 spray into both nostrils 2 (two) times daily. 30 mL 0  . pantoprazole (PROTONIX) 40 MG tablet TAKE ONE TABLET BY MOUTH ONCE DAILY 30 tablet 0  . promethazine (PHENERGAN) 12.5 MG tablet Take 0.5 tablets (6.25 mg total) by mouth every 8 (eight) hours as needed for nausea or vomiting. (Patient taking differently: Take 6.25 mg by mouth daily as needed for nausea or vomiting. ) 30 tablet 0  . rifaximin (XIFAXAN) 550 MG TABS tablet Take 1 tablet (550 mg total) by mouth 2 (two) times daily. 60 tablet 1  . saline (AYR) GEL Place 1 application into the nose 2 (two) times daily as needed (for nose bleeds).    [name] spironolactone (ALDACTONE) 50 MG tablet Take 50 mg (1 tab) in am and 25 mg (1/2 tab) in pm. 45 tablet 6  . SYRINGE-NEEDLE, DISP, 3 ML 25G X 5/8" 3 ML MISC To use with B12 injections 50 each 2  . b complex-vitamin c-folic acid (NEPHRO-VITE) 0.8 MG TABS tablet Take 1 tablet by mouth daily. 30 tablet 6  . KLOR-CON M20 20 MEQ tablet TAKE TWO TABLETS BY MOUTH IN THE MORNING AND ONE TABLET IN THE EVENING 90 tablet 1  . torsemide (DEMADEX) 10 MG tablet Take 2 tablets (20 mg total) by mouth 2 (two) times daily. 120 tablet 1   No facility-administered medications prior to visit.    No Known Allergies  Review of Systems  Constitutional: Positive for malaise/fatigue. Negative for fever.    HENT: Positive for congestion.   Eyes: Negative for blurred vision.  Respiratory: Positive for cough. Negative for shortness of breath.   Cardiovascular: Negative for chest pain, palpitations and leg swelling.  Gastrointestinal: Positive for nausea. Negative for abdominal pain and blood in stool.  Genitourinary: Negative for dysuria and frequency.  Musculoskeletal: Negative for falls.  Skin: Negative for rash.  Neurological: Negative for dizziness, loss of consciousness and headaches.  Endo/Heme/Allergies: Negative for environmental allergies.  Psychiatric/Behavioral: Negative for depression. The patient is not nervous/anxious.        Objective:    Physical Exam  Constitutional: He is oriented to person, place, and time. He appears well-developed and well-nourished. No distress.  Frail, in a wheelchair  HENT:  Head: Normocephalic and atraumatic.  Nose: Nose normal.  Eyes: Right eye exhibits no discharge. Left eye exhibits no discharge.  Neck: Normal range of motion. Neck supple.  Cardiovascular: Normal rate and intact distal pulses.  Exam reveals no friction rub.   Murmur heard. Pulmonary/Chest: Effort normal and breath sounds normal.  Abdominal: Soft. Bowel sounds are normal. There is no tenderness.  Musculoskeletal: He exhibits no edema.  Neurological: He is alert and oriented to person, place, and time.  Skin: Skin is warm and dry.  Psychiatric: He has a normal mood and affect.  Nursing note and vitals reviewed.   BP 102/68 mmHg  Pulse 108  Temp(Src) 97.6 F (36.4 C) (Oral)  Wt 177 lb (80.287 kg)  SpO2 97% Wt Readings from Last 3 Encounters:  06/01/16 177 lb (80.287 kg)  05/18/16 172 lb (78.019 kg)  05/09/16 174 lb 8 oz (79.153 kg)     Lab Results  Component Value Date   WBC 4.2 06/06/2016   HGB 7.2 Repeated and verified X2.* 06/06/2016   HCT 22.3 Repeated and verified X2.* 06/06/2016   PLT 67.0* 06/06/2016   GLUCOSE 156* 06/06/2016   ALT 23 06/06/2016    AST 35 06/06/2016   NA 129* 06/06/2016   K 5.6* 06/06/2016   CL 100 06/06/2016   CREATININE 1.91* 06/06/2016   BUN 47* 06/06/2016   CO2 25 06/06/2016   TSH 5.792* 04/30/2016   INR 1.33 05/01/2016    Lab Results  Component Value Date   TSH 5.792* 04/30/2016   Lab Results  Component Value Date   WBC 4.2 06/06/2016   HGB 7.2 Repeated and verified X2.* 06/06/2016   HCT 22.3 Repeated and verified X2.* 06/06/2016   MCV 85.3 06/06/2016   PLT 67.0* 06/06/2016   Lab Results  Component Value Date   NA 129* 06/06/2016   K 5.6* 06/06/2016   CO2 25 06/06/2016   GLUCOSE 156* 06/06/2016   BUN 47* 06/06/2016   CREATININE 1.91* 06/06/2016   BILITOT 2.1* 06/06/2016   ALKPHOS 196* 06/06/2016   AST 35 06/06/2016   ALT 23 06/06/2016   PROT 5.3* 06/06/2016   ALBUMIN 2.9* 06/06/2016   CALCIUM 8.6 06/06/2016   ANIONGAP 7 05/05/2016   GFR 36.34* 06/06/2016   No results found for: CHOL No results found for: HDL No results found for: LDLCALC No results found for: TRIG No results found for: CHOLHDL No results found for: HGBA1C     Assessment & Plan:   Problem List Items Addressed This Visit    Hyponatremia    Will need to monitor, no symptoms at present. Decrease fluid intake slightly and continue diuretics. Repeat labs in 1 week      Relevant Orders   CBC w/Diff (Completed)   Comp Met (CMET) (Completed)   Retic (Completed)   Anemia due to other cause    Multifactorial anemia persists. Will need referral to hematology and further transfusions.       Nausea without vomiting    Consider ginger prn but also allowed Phenergan and Zofran to use prn       Other Visit Diagnoses  Anemia, unspecified anemia type    -  Primary    Relevant Orders    CBC w/Diff (Completed)    Comp Met (CMET) (Completed)    Retic (Completed)    Shingles        Relevant Orders    Ambulatory referral to [place]       I am having Mr. Cominsky maintain his ferrous sulfate, Magnesium, feeding  supplement (ENSURE ENLIVE), SYRINGE-NEEDLE (DISP) 3 ML, spironolactone, cyanocobalamin, metolazone, lactulose, promethazine, saline, feeding supplement (ENSURE ENLIVE), oxymetazoline, rifaximin, ondansetron, digoxin, pantoprazole, Gerhardt's butt cream, and b complex-vitamin c-folic acid.  Meds ordered this encounter  Medications  . b complex-vitamin c-folic acid (NEPHRO-VITE) 0.8 MG TABS tablet    Sig: Take 1 tablet by mouth daily.    Dispense:  90 tablet    Refill:  2     Penni Homans, MD

## 2016-06-11 NOTE — Assessment & Plan Note (Signed)
Multifactorial anemia persists. Will need referral to hematology and further transfusions.

## 2016-06-11 NOTE — Assessment & Plan Note (Signed)
Consider ginger prn but also allowed Phenergan and Zofran to use prn

## 2016-06-14 ENCOUNTER — Emergency Department (HOSPITAL_COMMUNITY): Payer: Medicare Other

## 2016-06-14 ENCOUNTER — Telehealth (HOSPITAL_COMMUNITY): Payer: Self-pay

## 2016-06-14 ENCOUNTER — Encounter (HOSPITAL_COMMUNITY): Payer: Self-pay | Admitting: Internal Medicine

## 2016-06-14 ENCOUNTER — Inpatient Hospital Stay (HOSPITAL_COMMUNITY)
Admission: EM | Admit: 2016-06-14 | Discharge: 2016-06-29 | DRG: 291 | Disposition: A | Discharge status: Hospice | Payer: Medicare Other | Attending: Internal Medicine | Admitting: Internal Medicine

## 2016-06-14 DIAGNOSIS — L89313 Pressure ulcer of right buttock, stage 3: Secondary | ICD-10-CM | POA: Diagnosis present

## 2016-06-14 DIAGNOSIS — I4891 Unspecified atrial fibrillation: Secondary | ICD-10-CM | POA: Diagnosis present

## 2016-06-14 DIAGNOSIS — D500 Iron deficiency anemia secondary to blood loss (chronic): Secondary | ICD-10-CM | POA: Diagnosis present

## 2016-06-14 DIAGNOSIS — N189 Chronic kidney disease, unspecified: Secondary | ICD-10-CM

## 2016-06-14 DIAGNOSIS — Z950 Presence of cardiac pacemaker: Secondary | ICD-10-CM

## 2016-06-14 DIAGNOSIS — N390 Urinary tract infection, site not specified: Secondary | ICD-10-CM | POA: Diagnosis present

## 2016-06-14 DIAGNOSIS — E871 Hypo-osmolality and hyponatremia: Secondary | ICD-10-CM | POA: Diagnosis present

## 2016-06-14 DIAGNOSIS — K7290 Hepatic failure, unspecified without coma: Secondary | ICD-10-CM | POA: Diagnosis present

## 2016-06-14 DIAGNOSIS — I5031 Acute diastolic (congestive) heart failure: Secondary | ICD-10-CM | POA: Diagnosis not present

## 2016-06-14 DIAGNOSIS — Z85820 Personal history of malignant melanoma of skin: Secondary | ICD-10-CM

## 2016-06-14 DIAGNOSIS — Z515 Encounter for palliative care: Secondary | ICD-10-CM | POA: Diagnosis not present

## 2016-06-14 DIAGNOSIS — N184 Chronic kidney disease, stage 4 (severe): Secondary | ICD-10-CM | POA: Diagnosis present

## 2016-06-14 DIAGNOSIS — D638 Anemia in other chronic diseases classified elsewhere: Secondary | ICD-10-CM | POA: Diagnosis present

## 2016-06-14 DIAGNOSIS — R319 Hematuria, unspecified: Secondary | ICD-10-CM | POA: Diagnosis present

## 2016-06-14 DIAGNOSIS — E875 Hyperkalemia: Secondary | ICD-10-CM | POA: Diagnosis present

## 2016-06-14 DIAGNOSIS — D6959 Other secondary thrombocytopenia: Secondary | ICD-10-CM | POA: Diagnosis present

## 2016-06-14 DIAGNOSIS — K761 Chronic passive congestion of liver: Secondary | ICD-10-CM | POA: Diagnosis present

## 2016-06-14 DIAGNOSIS — Z66 Do not resuscitate: Secondary | ICD-10-CM | POA: Insufficient documentation

## 2016-06-14 DIAGNOSIS — I130 Hypertensive heart and chronic kidney disease with heart failure and stage 1 through stage 4 chronic kidney disease, or unspecified chronic kidney disease: Secondary | ICD-10-CM | POA: Diagnosis present

## 2016-06-14 DIAGNOSIS — I780 Hereditary hemorrhagic telangiectasia: Secondary | ICD-10-CM | POA: Diagnosis present

## 2016-06-14 DIAGNOSIS — I509 Heart failure, unspecified: Secondary | ICD-10-CM | POA: Diagnosis not present

## 2016-06-14 DIAGNOSIS — I272 Other secondary pulmonary hypertension: Secondary | ICD-10-CM | POA: Diagnosis present

## 2016-06-14 DIAGNOSIS — I5033 Acute on chronic diastolic (congestive) heart failure: Secondary | ICD-10-CM | POA: Diagnosis present

## 2016-06-14 DIAGNOSIS — K7460 Unspecified cirrhosis of liver: Secondary | ICD-10-CM | POA: Diagnosis present

## 2016-06-14 DIAGNOSIS — R233 Spontaneous ecchymoses: Secondary | ICD-10-CM | POA: Diagnosis present

## 2016-06-14 DIAGNOSIS — Z79899 Other long term (current) drug therapy: Secondary | ICD-10-CM

## 2016-06-14 DIAGNOSIS — N179 Acute kidney failure, unspecified: Secondary | ICD-10-CM | POA: Diagnosis present

## 2016-06-14 DIAGNOSIS — I5043 Acute on chronic combined systolic (congestive) and diastolic (congestive) heart failure: Secondary | ICD-10-CM | POA: Diagnosis present

## 2016-06-14 DIAGNOSIS — R7989 Other specified abnormal findings of blood chemistry: Secondary | ICD-10-CM

## 2016-06-14 DIAGNOSIS — N2889 Other specified disorders of kidney and ureter: Secondary | ICD-10-CM | POA: Diagnosis present

## 2016-06-14 DIAGNOSIS — L8990 Pressure ulcer of unspecified site, unspecified stage: Secondary | ICD-10-CM | POA: Diagnosis present

## 2016-06-14 LAB — CBC WITH DIFFERENTIAL/PLATELET
BASOS PCT: 0 %
Basophils Absolute: 0 10*3/uL (ref 0.0–0.1)
EOS ABS: 0.1 10*3/uL (ref 0.0–0.7)
Eosinophils Relative: 1 %
HCT: 25 % — ABNORMAL LOW (ref 39.0–52.0)
Hemoglobin: 8 g/dL — ABNORMAL LOW (ref 13.0–17.0)
LYMPHS ABS: 0.6 10*3/uL — AB (ref 0.7–4.0)
Lymphocytes Relative: 10 %
MCH: 28.2 pg (ref 26.0–34.0)
MCHC: 32 g/dL (ref 30.0–36.0)
MCV: 88 fL (ref 78.0–100.0)
MONO ABS: 0.5 10*3/uL (ref 0.1–1.0)
MONOS PCT: 9 %
Neutro Abs: 4.5 10*3/uL (ref 1.7–7.7)
Neutrophils Relative %: 80 %
PLATELETS: 72 10*3/uL — AB (ref 150–400)
RBC: 2.84 MIL/uL — ABNORMAL LOW (ref 4.22–5.81)
RDW: 18.3 % — AB (ref 11.5–15.5)
WBC: 5.7 10*3/uL (ref 4.0–10.5)

## 2016-06-14 LAB — BASIC METABOLIC PANEL
ANION GAP: 10 (ref 5–15)
BUN: 76 mg/dL — ABNORMAL HIGH (ref 6–20)
CALCIUM: 8.6 mg/dL — AB (ref 8.9–10.3)
CO2: 20 mmol/L — ABNORMAL LOW (ref 22–32)
Chloride: 93 mmol/L — ABNORMAL LOW (ref 101–111)
Creatinine, Ser: 3.26 mg/dL — ABNORMAL HIGH (ref 0.61–1.24)
GFR, EST AFRICAN AMERICAN: 19 mL/min — AB (ref >60)
GFR, EST NON AFRICAN AMERICAN: 17 mL/min — AB (ref >60)
Glucose, Bld: 122 mg/dL — ABNORMAL HIGH (ref 65–99)
Potassium: 5.8 mmol/L — ABNORMAL HIGH (ref 3.5–5.1)
Sodium: 123 mmol/L — ABNORMAL LOW (ref 135–145)

## 2016-06-14 LAB — COMPREHENSIVE METABOLIC PANEL
ALT: 23 U/L (ref 17–63)
ANION GAP: 10 (ref 5–15)
AST: 41 U/L (ref 15–41)
Albumin: 2.8 g/dL — ABNORMAL LOW (ref 3.5–5.0)
Alkaline Phosphatase: 165 U/L — ABNORMAL HIGH (ref 38–126)
BILIRUBIN TOTAL: 2.9 mg/dL — AB (ref 0.3–1.2)
BUN: 76 mg/dL — ABNORMAL HIGH (ref 6–20)
CO2: 20 mmol/L — ABNORMAL LOW (ref 22–32)
Calcium: 8.6 mg/dL — ABNORMAL LOW (ref 8.9–10.3)
Chloride: 94 mmol/L — ABNORMAL LOW (ref 101–111)
Creatinine, Ser: 3.34 mg/dL — ABNORMAL HIGH (ref 0.61–1.24)
GFR, EST AFRICAN AMERICAN: 19 mL/min — AB (ref >60)
GFR, EST NON AFRICAN AMERICAN: 16 mL/min — AB (ref >60)
Glucose, Bld: 117 mg/dL — ABNORMAL HIGH (ref 65–99)
POTASSIUM: 5.9 mmol/L — AB (ref 3.5–5.1)
Sodium: 124 mmol/L — ABNORMAL LOW (ref 135–145)
TOTAL PROTEIN: 5.3 g/dL — AB (ref 6.5–8.1)

## 2016-06-14 LAB — TROPONIN I: Troponin I: 0.08 ng/mL (ref <0.03)

## 2016-06-14 LAB — I-STAT TROPONIN, ED: Troponin i, poc: 0.05 ng/mL (ref 0.00–0.08)

## 2016-06-14 LAB — DIGOXIN LEVEL: Digoxin Level: 0.7 ng/mL — ABNORMAL LOW (ref 0.8–2.0)

## 2016-06-14 LAB — AMMONIA: AMMONIA: 118 umol/L — AB (ref 9–35)

## 2016-06-14 LAB — BRAIN NATRIURETIC PEPTIDE: B NATRIURETIC PEPTIDE 5: 340.4 pg/mL — AB (ref 0.0–100.0)

## 2016-06-14 MED ORDER — DEXTROSE 5 % IV SOLN
120.0000 mg | Freq: Once | INTRAVENOUS | Status: AC
  Administered 2016-06-14: 120 mg via INTRAVENOUS
  Filled 2016-06-14: qty 12

## 2016-06-14 MED ORDER — ACYCLOVIR 800 MG PO TABS
800.0000 mg | ORAL_TABLET | Freq: Three times a day (TID) | ORAL | Status: DC
Start: 2016-06-14 — End: 2016-06-15
  Administered 2016-06-14: 800 mg via ORAL
  Filled 2016-06-14 (×2): qty 1

## 2016-06-14 MED ORDER — ONDANSETRON HCL 4 MG PO TABS: 4.0000 mg | ORAL_TABLET | Freq: Four times a day (QID) | ORAL | Status: DC | PRN

## 2016-06-14 MED ORDER — RIFAXIMIN 550 MG PO TABS
550.0000 mg | ORAL_TABLET | Freq: Two times a day (BID) | ORAL | Status: DC
  Administered 2016-06-14 – 2016-06-28 (×23): 550 mg via ORAL
  Filled 2016-06-14 (×30): qty 1

## 2016-06-14 MED ORDER — HYDROCODONE-ACETAMINOPHEN 5-325 MG PO TABS: 1.0000 | ORAL_TABLET | ORAL | Status: DC | PRN

## 2016-06-14 MED ORDER — FERROUS SULFATE 325 (65 FE) MG PO TABS
325.0000 mg | ORAL_TABLET | Freq: Two times a day (BID) | ORAL | Status: DC
  Administered 2016-06-15 – 2016-06-16 (×4): 325 mg via ORAL
  Filled 2016-06-14 (×4): qty 1

## 2016-06-14 MED ORDER — SODIUM CHLORIDE 0.9% FLUSH
3.0000 mL | Freq: Two times a day (BID) | INTRAVENOUS | Status: DC
Start: 2016-06-14 — End: 2016-06-29
  Administered 2016-06-14 – 2016-06-28 (×25): 3 mL via INTRAVENOUS

## 2016-06-14 MED ORDER — SPIRONOLACTONE 25 MG PO TABS
25.0000 mg | ORAL_TABLET | Freq: Every day | ORAL | Status: DC
  Administered 2016-06-14 – 2016-06-27 (×11): 25 mg via ORAL
  Filled 2016-06-14 (×12): qty 1

## 2016-06-14 MED ORDER — FUROSEMIDE 10 MG/ML IJ SOLN: 40.0000 mg | INTRAMUSCULAR | Status: DC

## 2016-06-14 MED ORDER — ACETAMINOPHEN 325 MG PO TABS
650.0000 mg | ORAL_TABLET | Freq: Four times a day (QID) | ORAL | Status: DC | PRN
  Administered 2016-06-19: 650 mg via ORAL
  Filled 2016-06-14: qty 2

## 2016-06-14 MED ORDER — SPIRONOLACTONE 25 MG PO TABS
50.0000 mg | ORAL_TABLET | Freq: Every day | ORAL | Status: DC
  Administered 2016-06-15 – 2016-06-28 (×12): 50 mg via ORAL
  Filled 2016-06-14 (×16): qty 2

## 2016-06-14 MED ORDER — ACETAMINOPHEN 650 MG RE SUPP: 650.0000 mg | Freq: Four times a day (QID) | RECTAL | Status: DC | PRN

## 2016-06-14 MED ORDER — LACTULOSE 10 GM/15ML PO SOLN
30.0000 g | Freq: Two times a day (BID) | ORAL | Status: DC
  Administered 2016-06-14 – 2016-06-19 (×11): 30 g via ORAL
  Filled 2016-06-14 (×11): qty 45

## 2016-06-14 MED ORDER — PATIROMER SORBITEX CALCIUM 8.4 G PO PACK
8.4000 g | PACK | Freq: Every day | ORAL | Status: DC
  Administered 2016-06-14 – 2016-06-16 (×3): 8.4 g via ORAL
  Filled 2016-06-14 (×4): qty 4

## 2016-06-14 MED ORDER — SPIRONOLACTONE 25 MG PO TABS: 25.0000 mg | ORAL_TABLET | Freq: Two times a day (BID) | ORAL | Status: DC

## 2016-06-14 MED ORDER — PANTOPRAZOLE SODIUM 40 MG PO TBEC
40.0000 mg | DELAYED_RELEASE_TABLET | Freq: Every day | ORAL | Status: DC
  Administered 2016-06-15 – 2016-06-28 (×12): 40 mg via ORAL
  Filled 2016-06-14 (×13): qty 1

## 2016-06-14 MED ORDER — DIGOXIN 125 MCG PO TABS
0.1250 mg | ORAL_TABLET | ORAL | Status: DC
  Administered 2016-06-16 – 2016-06-28 (×5): 0.125 mg via ORAL
  Filled 2016-06-14 (×10): qty 1

## 2016-06-14 MED ORDER — GERHARDT'S BUTT CREAM
1.0000 "application " | TOPICAL_CREAM | Freq: Three times a day (TID) | CUTANEOUS | Status: DC | PRN
  Filled 2016-06-14 (×2): qty 1

## 2016-06-14 MED ORDER — ONDANSETRON HCL 4 MG/2ML IJ SOLN
4.0000 mg | Freq: Four times a day (QID) | INTRAMUSCULAR | Status: DC | PRN
  Administered 2016-06-15 – 2016-06-17 (×3): 4 mg via INTRAVENOUS
  Filled 2016-06-14 (×3): qty 2

## 2016-06-14 NOTE — H&P (Signed)
Hunter [name] [id number] DOB: 11/05/1935 DOA: 06/14/2016     PCP: Penni Homans, MD   Outpatient Specialists: Cardiology [facility], scopes by Dr Redmond Baseman of ENT and Dr Ardis Hughs of GI while in the hospital.  Patient coming from:    home Lives   With family    Chief Complaint: increased shortness of breath  HPI: Hunter [name] is a 81 y.o. 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 Per CHF nurse. She had some worsening shortness of breath last week called his cardiologist Dr. Jeffie Pollock Patient had recently his torsemide increased to 80 mg in the morning 40 in the evening as well as increase his metolazone to 2.5 for 2 days. He has lost 2 pounds and his swelling has improved. He was found to have hemoglobin of 6.9 by Dr. Randel Pigg  patient was transfused on June 30 2 units of PRBC.  Was doing at first better but then started developing again progressive shortness of breath. When he was seen today by home health nurse he was noted to have respirations and 30 Rales and decreased breath sounds nurse also thought that he may have irregular heartbeat at that time.  They called 911 and patient was transferred to [facility] ER. He does endorse some worsening shortness of breath and lower extremity swelling since this morning apparently his family attempted to give extra dose of metolazone with no improvement. Denies any chest pain no fevers or chills no dizziness no lightheadedness no cough he has been complaining his medications. No confusion no fever.    Regarding pertinent Chronic problems: He has recurrent epistaxis has been going on  for years in the past and been seen by ENT. Recently was admitted secondary to vomiting bright red blood GI was consulted and was treated with Protonix and octreotide and empiric Rocephin EGD was done on 22nd which could not be done secondary to severe epistaxis was worrisome for aspiration. He required EGD and the general anesthesia and 24th of May status post ablation of multiple bleeding gastric AVMs undergone nasal endoscopy and cauterization multiple bleeding sites last echogram was in August 2016 with EF of 45-50 percent   IN ER: Afebrile pulse 80 respirations 22 blood pressure 103/ 53 WBC 5.7 hemoglobin 8.0 platelets 72 sodium 124 potassium 5.9 creatinine 3.34 up from baseline of 1.9 Chest x-ray showed enlargement of cardiac silhouette and pulmonary vascular congestion ER provider spoke to Dr. Radford Pax with Cardiology who rec Nephrology consult and Dr. Jeffie Pollock to see in  AM  Hospitalist was called for admission for acute on chronic renal failure  Review of Systems:    Pertinent positives include:  shortness of breath at rest.  dyspnea on exertion,   Constitutional:  No weight loss, night sweats, Fevers, chills, fatigue, weight loss  HEENT:  No headaches, Difficulty swallowing,Tooth/dental problems,Sore throat,  No sneezing, itching, ear ache, nasal congestion, post nasal drip,  Cardio-vascular:  No chest pain, Orthopnea, PND, anasarca, dizziness, palpitations.no Bilateral lower extremity swelling  GI:  No heartburn, indigestion, abdominal pain, nausea, vomiting, diarrhea, change in bowel habits, loss of appetite, melena, blood in stool, hematemesis Resp:   No excess mucus, no productive cough, No non-productive cough, No coughing up of blood.No change in  color of mucus.No wheezing. Skin:  no rash or lesions. No jaundice GU:  no dysuria, change in color of urine, no urgency or frequency. No straining to urinate.  No flank pain.  Musculoskeletal:  No joint pain or no joint  swelling. No decreased range of motion. No back pain.  Psych:  No change in mood or affect. No depression or anxiety. No memory loss.  Neuro: no localizing neurological complaints, no tingling, no weakness, no double vision, no gait abnormality, no slurred speech, no confusion  As per HPI otherwise 10 point review of systems negative.   Past Medical History: Past Medical History  Diagnosis Date  . CHF (congestive heart failure) ([place])   . Pacemaker     single lead Medtronic pacemaker  . HHT (hereditary hemorrhagic telangiectasia) ([place])   . Anemia   . Pulmonary hypertension ([place])   . Osler-Weber-Rendu syndrome ([place])   . Renal insufficiency   . Cirrhosis of liver ([place])     felt to have cardiac cirrhosis.   . Atrial fibrillation ([place])   . Hepatic AV fistula ([place]) 08/09/2015  . 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 Anemia due to chronic blood loss 08/09/2015  . Right heart failure ([place]) 08/09/2015  . Tricuspid regurgitation 08/09/2015  . Melanoma ([place])     2 on nose, 1 on back, 1 on leg  . Diarrhea 05/09/2016  . Nausea without vomiting 06/11/2016   Past Surgical History  Procedure Laterality Date  . Pacemaker insertion    . Colon surgery  1196  . Esophagogastroduodenoscopy N/A 08/13/2015    Procedure: ESOPHAGOGASTRODUODENOSCOPY (EGD);  Surgeon: Ladene Artist, MD;  Location: [facility] ENDOSCOPY;  Service: Endoscopy;  Laterality: N/A;  . Tonsillectomy Bilateral   . Esophagogastroduodenoscopy N/A 05/01/2016    Procedure: ESOPHAGOGASTRODUODENOSCOPY (EGD);  Surgeon: Milus Banister, MD;  Location: [place];  Service: Endoscopy;  Laterality: N/A;  . Nasal endoscopy with epistaxis control N/A 05/03/2016    Procedure: NASAL ENDOSCOPY WITH EPISTAXIS CONTROL;  Surgeon: Melida Quitter, MD;  Location: [place];  Service: ENT;  Laterality: N/A;  Dr Oretha Caprice to follow with upper endoscopy  . Esophagogastroduodenoscopy N/A 05/03/2016    Procedure: ESOPHAGOGASTRODUODENOSCOPY (EGD);  Surgeon: Milus Banister, MD;   Location: [place];  Service: Gastroenterology;  Laterality: N/A;     Social History:  Ambulatory  walker      reports that he has never smoked. He has never used smokeless tobacco. He reports that he does not drink alcohol or use illicit drugs.  Allergies:  No Known Allergies     Family History:    Family History  Problem Relation Age of Onset  . Other Mother     HHT  . Other Son     HHT  . Heart failure Father   . Heart disease Sister     afib, pacer  . Arthritis Brother     b/l TKR  . Other Brother   . Heart disease Sister     pacer    Medications: Prior to Admission medications   Medication Sig Start Date End Date Taking? Authorizing Provider  b complex-vitamin c-folic acid (NEPHRO-VITE) 0.8 MG TABS tablet Take 1 tablet by mouth daily. 06/01/16  Yes Mosie Lukes, MD  cyanocobalamin (,VITAMIN B-12,) 1000 MCG/ML injection Inject 1,000 mcg into the muscle every 30 (thirty) days. 1st of every month   Yes Historical Provider, MD  digoxin (LANOXIN) 0.125 MG tablet Take 1 tablet (0.125 mg total) by mouth 3 (three) times a  week. Mon, Wed, Fri Patient taking differently: Take 0.125 mg by mouth every Monday, Wednesday, and Friday. Mon, Wed, Fri 05/09/16  Yes Mosie Lukes, MD  feeding supplement, ENSURE ENLIVE, (ENSURE ENLIVE) LIQD Take 237 mLs by mouth 2 (two) times daily between meals. 08/15/15  Yes Olam Idler, MD  ferrous sulfate 325 (65 FE) MG EC tablet Take 325 mg by mouth 2 (two) times daily with a meal.    Yes Historical Provider, MD  Hydrocortisone (GERHARDT'S BUTT CREAM) CREA Apply 1 application topically 3 (three) times daily. Patient taking differently: Apply 1 application topically 3 (three) times daily as needed for irritation.  05/31/16  Yes Mosie Lukes, MD  KLOR-CON M20 20 MEQ tablet TAKE TWO TABLETS BY MOUTH IN THE MORNING, THEN TAKE ONE IN THE EVENING 06/07/16  Yes Jolaine Artist, MD  lactulose (CHRONULAC) 10 GM/15ML solution Take 45 mLs (30 g total) by  mouth 3 (three) times daily. Titrate to 3-4 BM's day Patient taking differently: Take 30 g by mouth 3 (three) times daily. Titrate to 2-3 BM's day 04/23/16  Yes Ripudeep Krystal Eaton, MD  Magnesium 250 MG TABS Take 250 mg by mouth daily.    Yes Historical Provider, MD  metolazone (ZAROXOLYN) 2.5 MG tablet Take 1 tablet (2.5 mg total) by mouth as needed (for weight of 171 lb or greater). 03/27/16  Yes Jolaine Artist, MD  ondansetron (ZOFRAN) 4 MG tablet Take 1 tablet (4 mg total) by mouth every 8 (eight) hours as needed for nausea or vomiting. 05/05/16  Yes Reyne Dumas, MD  oxymetazoline (AFRIN) 0.05 % nasal spray Place 1 spray into both nostrils 2 (two) times daily. Patient taking differently: Place 1 spray into both nostrils 2 (two) times daily as needed for congestion.  05/05/16  Yes Reyne Dumas, MD  pantoprazole (PROTONIX) 40 MG tablet TAKE ONE TABLET BY MOUTH ONCE DAILY 05/24/16  Yes Jolaine Artist, MD  rifaximin (XIFAXAN) 550 MG TABS tablet Take 1 tablet (550 mg total) by mouth 2 (two) times daily. 05/05/16  Yes Reyne Dumas, MD  saline (AYR) GEL Place 1 application into the nose 2 (two) times daily as needed (for nose bleeds).   Yes Historical Provider, MD  spironolactone (ALDACTONE) 50 MG tablet Take 50 mg (1 tab) in am and 25 mg (1/2 tab) in pm. Patient taking differently: Take 25-50 mg by mouth 2 (two) times daily. Take 50 mg (1 tab) in am and 25 mg (1/2 tab) in pm. 03/20/16  Yes Jolaine Artist, MD  torsemide (DEMADEX) 20 MG tablet Take 4 tablets in the AM and 2 tablets in the PM Patient taking differently: Take 40-80 mg by mouth 2 (two) times daily. Take 4 tablets in the AM and 2 tablets in the PM 06/07/16  Yes Jolaine Artist, MD  acyclovir (ZOVIRAX) 400 MG tablet Take 2 tablets by mouth 5 (five) times daily. Reported on 06/14/2016 06/01/16 06/14/16  Historical Provider, MD  feeding supplement, ENSURE ENLIVE, (ENSURE ENLIVE) LIQD Take 237 mLs by mouth 2 (two) times daily between meals. 05/05/16    Reyne Dumas, MD  lactulose (CHRONULAC) 10 GM/15ML solution TAKE 45MLS BY MOUTH TWICE DAILY 06/05/16   Mosie Lukes, MD  promethazine (PHENERGAN) 12.5 MG tablet Take 0.5 tablets (6.25 mg total) by mouth every 8 (eight) hours as needed for nausea or vomiting. Patient taking differently: Take 6.25 mg by mouth daily as needed for nausea or vomiting.  04/23/16   Ripudeep Krystal Eaton, MD  SYRINGE-NEEDLE, DISP, 3 ML 25G X 5/8" 3 ML MISC To use with B12 injections 03/09/16   Mosie Lukes, MD    Physical Exam: Patient Vitals for the past 24 hrs:  BP Temp Temp src Pulse Resp SpO2  06/14/16 1815 (!) 111/52 mmHg - - 83 21 99 %  06/14/16 1800 104/62 mmHg - - 80 21 98 %  06/14/16 1715 (!) 101/53 mmHg - - 80 22 99 %  06/14/16 1700 (!) 106/54 mmHg - - 80 20 99 %  06/14/16 1631 104/58 mmHg 97.8 F (36.6 C) Oral 86 22 100 %    1. General:  in No Acute distress 2. Psychological: Alert and  Oriented X4 3. Head/ENT:   Moist  Mucous Membranes                          Head Non traumatic, neck supple                          Normal   Dentition 4. SKIN: norma urgor,  Skin clean Dry and intact no rash 5. Heart: Regular rate and rhythm no  Murmur, Rub or gallop 6. Lungs  no wheezes some bilateral  crackles   7. Abdomen: Soft, non-tender,   Distended aplpablebladder 8. Lower extremities: no clubbing, cyanosis,bilateral edema 9. Neurologically Grossly intact, moving all 4 extremities equally 10. MSK: Normal range of motion   body mass index is unknown because there is no weight on file.  Labs on Admission:   Labs on Admission: I have personally reviewed following labs and imaging studies  CBC:  Recent Labs Lab 06/14/16 1640  WBC 5.7  NEUTROABS 4.5  HGB 8.0*  HCT 25.0*  MCV 88.0  PLT 72*   Basic Metabolic Panel:  Recent Labs Lab 06/14/16 1640 06/14/16 1741  NA 123* 124*  K 5.8* 5.9*  CL 93* 94*  CO2 20* 20*  GLUCOSE 122* 117*  BUN 76* 76*  CREATININE 3.26* 3.34*  CALCIUM 8.6* 8.6*    GFR: Estimated Creatinine Clearance: 20 mL/min (by C-G formula based on Cr of 3.34). Liver Function Tests:  Recent Labs Lab 06/14/16 1741  AST 41  ALT 23  ALKPHOS 165*  BILITOT 2.9*  PROT 5.3*  ALBUMIN 2.8*   No results for input(s): LIPASE, AMYLASE in the last 168 hours.  Recent Labs Lab 06/14/16 1713  AMMONIA 118*   Coagulation Profile: No results for input(s): INR, PROTIME in the last 168 hours. Cardiac Enzymes: No results for input(s): CKTOTAL, CKMB, CKMBINDEX, TROPONINI in the last 168 hours. BNP (last 3 results)  Recent Labs  08/09/15 1621  PROBNP 188.0*   HbA1C: No results for input(s): HGBA1C in the last 72 hours. CBG: No results for input(s): GLUCAP in the last 168 hours. Lipid Profile: No results for input(s): CHOL, HDL, LDLCALC, TRIG, CHOLHDL, LDLDIRECT in the last 72 hours. Thyroid Function Tests: No results for input(s): TSH, T4TOTAL, FREET4, T3FREE, THYROIDAB in the last 72 hours. Anemia Panel: No results for input(s): VITAMINB12, FOLATE, FERRITIN, TIBC, IRON, RETICCTPCT in the last 72 hours. Urine analysis:    Component Value Date/Time   COLORURINE YELLOW 04/29/2016 2107   APPEARANCEUR CLEAR 04/29/2016 2107   LABSPEC 1.011 04/29/2016 2107   PHURINE 7.0 04/29/2016 2107   GLUCOSEU NEGATIVE 04/29/2016 2107   HGBUR NEGATIVE 04/29/2016 2107   BILIRUBINUR NEGATIVE 04/29/2016 2107   KETONESUR NEGATIVE 04/29/2016 2107   PROTEINUR NEGATIVE 04/29/2016  2107   UROBILINOGEN 0.2 08/10/2015 1700   NITRITE NEGATIVE 04/29/2016 2107   LEUKOCYTESUR NEGATIVE 04/29/2016 2107   Sepsis Labs: @LABRCNTIP (procalcitonin:4,lacticidven:4) )No results found for this or any previous visit (from the past 240 hour(s)).    Digoxin level 0.7 BNP 340 Ammonia 118  UA ordered   No results found for: HGBA1C  Estimated Creatinine Clearance: 20 mL/min (by C-G formula based on Cr of 3.34).  BNP (last 3 results)  Recent Labs  08/09/15 1621  PROBNP 188.0*      ECG REPORT  Independently reviewed Rate: 86  Rhythm: Paced ST&T Change:NA QTC 500  There were no vitals filed for this visit.   Cultures: No results found for: [place], [place], CULT, REPTSTATUS   Radiological Exams on Admission: Dg Chest 2 View  06/14/2016  CLINICAL DATA:  Increasing shortness of breath and swelling since this morning, CHF, cirrhosis, atrial fibrillation, pulmonary hypertension, history melanoma EXAM: CHEST  2 VIEW COMPARISON:  05/01/2016 FINDINGS: LEFT subclavian transvenous pacemaker lead projects over RIGHT ventricle. Enlargement of cardiac silhouette with pulmonary vascular congestion. Atherosclerotic calcification aorta. Perihilar infiltrates greater on LEFT question asymmetric edema versus infection. Subsegmental atelectasis LEFT base. No pleural effusion or pneumothorax Bones demineralized. IMPRESSION: Enlargement of cardiac silhouette with pulmonary vascular congestion. Accentuated perihilar markings particularly on LEFT question asymmetric edema versus infection. Aortic atherosclerosis and pacemaker noted. Electronically Signed   By: Lavonia Dana M.D.   On: 06/14/2016 17:57    Chart has been reviewed    Assessment/Plan  81 y.o. 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 Anemia due to chronic blood loss - stable transfuse for Hg <7 . Cardiac cirrhosis - continue lactulose and Rifampin . HHT (hereditary hemorrhagic telangiectasia) ([place]) - currently stable no evidence of acute bleeding . Hyponatremia - chronic will order renal electrolytes likely   due to cardiac cirrhosis, CHF and chronic kidney disease  . Acute on chronic renal failure (HCC) - on physical exam patient has severely distended bladder. [address] Foley to possibly the patient had had bladder obstruction. We will obtain renal ultrasound  . Hyperkalemia hold home dose of potassium monitor on telemetry recheck after administration of Lasix in the morning  . Acute on chronic diastolic (congestive) heart failure [facility]) - appreciate cardiology consult as per nephrology consult will administer high-dose of Lasix IV to bypass absorption problems 1 dose and monitor thereafter. Place foley Elevated troponin in the setting of worsening renal function as well as acute diastolic heart failure. We'll continue to monitor likely secondary to demand ischemia no history of chest pain unlikely to be ACS     Other plan as per orders.  DVT prophylaxis:  SCD (thrombocytopenia)  Code Status:    DNR/DNI  as per  family   Family Communication:   Family  at  Bedside  plan of care was discussed with    Wife Drayk Passey ([id number]) [phone]  Disposition Plan:      To home once workup is complete and patient is stable   Consults called: Cardiology Nephrology  Admission status:  inpatient       Level of care    tele  [place] 06/14/2016, 9:35 PM    [facility]  Pager [phone]   after 2 AM please page floor coverage PA If 7AM-7PM, please contact the day team taking care of the patient  [email or web address]  Password [email or web address]

## 2016-06-14 NOTE — ED Notes (Signed)
Pt up to bedpan for mod amount loose but formed stool. Excoriation noted to buttocks and scrotum noted as swollen.

## 2016-06-14 NOTE — ED Notes (Addendum)
Patient here with increasing shortness of breath and swelling since am, swelling noted to neck and patient having a difficult time catching his breath, pale on arrival, denies pain. Alert but minimal verbal. Took extra fluid meds with no relief per family member. Directed to ED by cardiology

## 2016-06-14 NOTE — ED Notes (Signed)
Lennette Bihari, RN transporting patient upstairs at this time.

## 2016-06-14 NOTE — ED Notes (Signed)
Patient transported to X-ray 

## 2016-06-14 NOTE — Consult Note (Signed)
Reason for Consult: Acute Kidney Injury Referring Physician: Dr. Roel Cluck  Chief Complaint: Dyspnea  Assessment: 1. Acute Kidney Injury on CKDIIIB/IV - Patient has a baseline cr of 2.1-2.3 secondary to cardiorenal syndrome with acute worsening of renal function associated with decreased UOP + worsening dyspnea on exertion. His renal function has worsened with an increased dose of Demadex which is currently prescribed at 75m/40mg. Likely has cardiorenal syndrome; Demadex is much less affected by gut wall edema c/w Lasix but as with all diuretics subject to perfusion pressure and hypotension. - renal dose medications for a GFR<229mmin --> change Acyclovir to 80061m8hr - urine Na likely to be <20 c/w cardiorenal synrome with decreased effective arterial circulating volume especially with poor response to diuretics - will give a trial with Lasix 120m70m x1 given physical exam findings of volume overload + 7 lb weight gain on ROS - bladder scan to rule out obstructive uropathy - If there is no e/o obstruction + he doesn't respond to IV diuresis + continued poor UOP he may need a treatment of renal replacement therapy especially with the decreased renal reserve. 2. Hyperkalemia - multifactorial with the worsening renal function + continued Klor-Con consumption - Patiromer 8.4gm POx1 3. Gastric AVM's with recent nasal endoscopy and cauterization under general anesthesia. 4. HHT 5. Cardiac cirrhosis + CHF    HPI: Hunter [name] 80 y27. 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 He also has gained 7 lbs on a home floor scale associated with decreased UOP. He  has c/o incomplete bladder emptying but denies dysuria, hematuria or any NSAID use. He is compliant with medications and actually took his Klor-Con today as well. He has chronic diarrhea from the lactulose being prescribed for recurrent hepatic encephalopathy. He denies any productive cough, fever, chills. He was recently initiated on Acyclovir for shingles.  ROS Pertinent items are noted in HPI.  Chemistry and CBC: CREAT  Date/Time Value Ref Range Status  01/21/2016 03:07 PM 1.65* 0.70 - 1.18 mg/dL Final   CREATININE, SER  Date/Time Value Ref Range Status  06/14/2016 05:41 PM 3.34* 0.61 - 1.24 mg/dL Final  06/14/2016 04:40 PM 3.26* 0.61 - 1.24 mg/dL Final  06/06/2016 10:26 AM 1.91* 0.40 - 1.50 mg/dL Final  06/01/2016 03:02 PM 1.99* 0.40 - 1.50 mg/dL Final  05/09/2016 02:18 PM 2.18* 0.40 - 1.50 mg/dL Final  05/05/2016 03:11 AM 2.17* 0.61 - 1.24 mg/dL Final  05/04/2016 02:32 AM 2.18* 0.61 - 1.24 mg/dL Final  05/03/2016 03:45 AM 2.26* 0.61 - 1.24 mg/dL Final  05/02/2016 05:23 AM 2.42* 0.61 - 1.24 mg/dL Final  05/01/2016 03:50 AM 2.19* 0.61 - 1.24 mg/dL Final  04/30/2016 01:18 PM 2.38* 0.61 - 1.24 mg/dL Final  04/29/2016 06:57 PM 2.43* 0.61 - 1.24 mg/dL Final  04/23/2016 06:14 AM 2.41* 0.61 - 1.24 mg/dL Final  04/22/2016 08:11 AM 2.27* 0.61 - 1.24 mg/dL Final  04/21/2016 03:19 PM 2.28* 0.61 - 1.24 mg/dL Final  04/21/2016 11:30 AM 2.36* 0.61 - 1.24 mg/dL Final  03/27/2016 11:45 AM 2.20* 0.61 - 1.24 mg/dL Final  03/20/2016 12:39 PM 2.32* 0.61 - 1.24 mg/dL Final  03/02/2016 01:22 PM 1.97* 0.40 - 1.50 mg/dL Final  02/24/2016 11:48 AM 1.98* 0.40 - 1.50 mg/dL Final  11/26/2015 10:36 AM 2.11* 0.61 - 1.24 mg/dL Final  11/15/2015 03:25 PM 1.89* 0.40 - 1.50 mg/dL Final  10/07/2015 11:30 AM 1.93* 0.61 - 1.24 mg/dL Final  09/15/2015 04:30 PM 1.77* 0.61 - 1.24 mg/dL Final  09/02/2015 12:38 PM 1.71* 0.61 - 1.24 mg/dL Final  08/15/2015 06:45 AM 1.80* 0.61 - 1.24 mg/dL Final  08/14/2015 03:00 AM  1.99* 0.61 - 1.24 mg/dL Final  08/13/2015 12:15 PM 2.17* 0.61 - 1.24 mg/dL Final  08/12/2015 05:51 AM 2.18* 0.61 - 1.24 mg/dL Final  08/11/2015 05:32 AM 2.18* 0.61 - 1.24 mg/dL Final  08/10/2015 01:37 PM 2.50* 0.61 - 1.24 mg/dL Final  08/10/2015 01:20 PM 2.39* 0.61 - 1.24 mg/dL Final  08/09/2015 04:21 PM 2.19* 0.40 - 1.50 mg/dL Final  04/22/2015 12:30 PM 1.91* 0.61 - 1.24 mg/dL Final  04/22/2015 12:30 PM 1.86* 0.61 - 1.24 mg/dL Final    Recent Labs Lab 06/14/16 1640 06/14/16 1741  NA 123* 124*  K 5.8* 5.9*  CL 93* 94*  CO2 20* 20*  GLUCOSE 122* 117*  BUN 76* 76*  CREATININE 3.26* 3.34*  CALCIUM 8.6* 8.6*    Recent Labs Lab 06/14/16 1640  WBC 5.7  NEUTROABS 4.5  HGB 8.0*  HCT 25.0*  MCV 88.0  PLT 72*   Liver Function Tests:  Recent Labs Lab 06/14/16 1741  AST 41  ALT 23  ALKPHOS 165*  BILITOT 2.9*  PROT 5.3*  ALBUMIN 2.8*   No results for input(s): LIPASE, AMYLASE in the last 168 hours.  Recent Labs Lab 06/14/16 1713  AMMONIA 118*   Cardiac Enzymes:  Recent Labs Lab 06/14/16 1943  TROPONINI 0.08*   Iron Studies: No results for input(s): IRON, TIBC, TRANSFERRIN, FERRITIN in the last 72 hours. PT/INR: _0 (inr:5)  Xrays/Other Studies: ) Results for orders placed or performed during the hospital encounter of 06/14/16 (from the past 48 hour(s))  CBC with Differential     Status: Abnormal   Collection Time: 06/14/16  4:40 PM  Result Value Ref Range   WBC 5.7 4.0 - 10.5 K/uL   RBC 2.84 (L) 4.22 - 5.81 MIL/uL   Hemoglobin 8.0 (L) 13.0 - 17.0 g/dL   HCT 25.0 (L) 39.0 - 52.0 %   MCV 88.0 78.0 - 100.0 fL   MCH 28.2 26.0 - 34.0 pg   MCHC 32.0 30.0 - 36.0 g/dL   RDW 18.3 (H) 11.5 - 15.5 %   Platelets 72 (L) 150 - 400 K/uL    Comment: REPEATED TO VERIFY SPECIMEN CHECKED FOR CLOTS PLATELET COUNT CONFIRMED BY SMEAR    Neutrophils Relative % 80 %   Neutro Abs 4.5 1.7 - 7.7 K/uL   Lymphocytes Relative 10 %   Lymphs Abs 0.6 (L) 0.7 - 4.0 K/uL    Monocytes Relative 9 %   Monocytes Absolute 0.5 0.1 - 1.0 K/uL   Eosinophils Relative 1 %   Eosinophils Absolute 0.1 0.0 - 0.7 K/uL   Basophils Relative 0 %   Basophils Absolute 0.0 0.0 - 0.1 K/uL  Basic metabolic panel     Status: Abnormal   Collection Time: 06/14/16  4:40 PM  Result Value Ref Range   Sodium 123 (L) 135 - 145 mmol/L   Potassium 5.8 (H) 3.5 - 5.1 mmol/L   Chloride 93 (L) 101 - 111 mmol/L   CO2 20 (L) 22 - 32 mmol/L   Glucose, Bld 122 (H) 65 - 99 mg/dL   BUN 76 (  H) 6 - 20 mg/dL   Creatinine, Ser 3.26 (H) 0.61 - 1.24 mg/dL   Calcium 8.6 (L) 8.9 - 10.3 mg/dL   GFR calc non Af Amer 17 (L) >60 mL/min   GFR calc Af Amer 19 (L) >60 mL/min    Comment: (NOTE) The eGFR has been calculated using the CKD EPI equation. This calculation has not been validated in all clinical situations. eGFR's persistently <60 mL/min signify possible Chronic Kidney Disease.    Anion gap 10 5 - 15  Brain natriuretic peptide     Status: Abnormal   Collection Time: 06/14/16  4:40 PM  Result Value Ref Range   B Natriuretic Peptide 340.4 (H) 0.0 - 100.0 pg/mL  I-stat troponin, ED     Status: None   Collection Time: 06/14/16  5:12 PM  Result Value Ref Range   Troponin i, poc 0.05 0.00 - 0.08 ng/mL   Comment 3            Comment: Due to the release kinetics of cTnI, a negative result within the first hours of the onset of symptoms does not rule out myocardial infarction with certainty. If myocardial infarction is still suspected, repeat the test at appropriate intervals.   Ammonia     Status: Abnormal   Collection Time: 06/14/16  5:13 PM  Result Value Ref Range   Ammonia 118 (H) 9 - 35 umol/L  Digoxin level     Status: Abnormal   Collection Time: 06/14/16  5:41 PM  Result Value Ref Range   Digoxin Level 0.7 (L) 0.8 - 2.0 ng/mL  Comprehensive metabolic panel     Status: Abnormal   Collection Time: 06/14/16  5:41 PM  Result Value Ref Range   Sodium 124 (L) 135 - 145 mmol/L    Potassium 5.9 (H) 3.5 - 5.1 mmol/L   Chloride 94 (L) 101 - 111 mmol/L   CO2 20 (L) 22 - 32 mmol/L   Glucose, Bld 117 (H) 65 - 99 mg/dL   BUN 76 (H) 6 - 20 mg/dL   Creatinine, Ser 3.34 (H) 0.61 - 1.24 mg/dL   Calcium 8.6 (L) 8.9 - 10.3 mg/dL   Total Protein 5.3 (L) 6.5 - 8.1 g/dL   Albumin 2.8 (L) 3.5 - 5.0 g/dL   AST 41 15 - 41 U/L   ALT 23 17 - 63 U/L   Alkaline Phosphatase 165 (H) 38 - 126 U/L   Total Bilirubin 2.9 (H) 0.3 - 1.2 mg/dL   GFR calc non Af Amer 16 (L) >60 mL/min   GFR calc Af Amer 19 (L) >60 mL/min    Comment: (NOTE) The eGFR has been calculated using the CKD EPI equation. This calculation has not been validated in all clinical situations. eGFR's persistently <60 mL/min signify possible Chronic Kidney Disease.    Anion gap 10 5 - 15  Troponin I (q 6hr x 3)     Status: Abnormal   Collection Time: 06/14/16  7:43 PM  Result Value Ref Range   Troponin I 0.08 (HH) <0.03 ng/mL    Comment: CRITICAL RESULT CALLED TO, READ BACK BY AND VERIFIED WITH: GARNETT,E RN _0  BY GRINSTEAD,C 7.5.17    Dg Chest 2 View  06/14/2016  CLINICAL DATA:  Increasing shortness of breath and swelling since this morning, CHF, cirrhosis, atrial fibrillation, pulmonary hypertension, history melanoma EXAM: CHEST  2 VIEW COMPARISON:  05/01/2016 FINDINGS: LEFT subclavian transvenous pacemaker lead projects over RIGHT ventricle. Enlargement of cardiac silhouette with pulmonary vascular congestion.  Atherosclerotic calcification aorta. Perihilar infiltrates greater on LEFT question asymmetric edema versus infection. Subsegmental atelectasis LEFT base. No pleural effusion or pneumothorax Bones demineralized. IMPRESSION: Enlargement of cardiac silhouette with pulmonary vascular congestion. Accentuated perihilar markings particularly on LEFT question asymmetric edema versus infection. Aortic atherosclerosis and pacemaker noted. Electronically Signed   By: Lavonia Dana M.D.   On: 06/14/2016 17:57    PMH:   Past  Medical History  Diagnosis Date  . CHF (congestive heart failure) ([place])   . Pacemaker     single lead Med[employer]onic pacemaker  . HHT (hereditary hemorrhagic telangiectasia) ([place])   . Anemia   . Pulmonary hypertension ([place])   . Osler-Weber-Rendu syndrome ([place])   . Renal insufficiency   . Cirrhosis of liver ([place])     felt to have cardiac cirrhosis.   . Atrial fibrillation ([place])   . Hepatic AV fistula ([place]) 08/09/2015  . 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 Anemia due to chronic blood loss 08/09/2015  . Right heart failure ([place]) 08/09/2015  . Tricuspid regurgitation 08/09/2015  . Melanoma ([place])     2 on nose, 1 on back, 1 on leg  . Diarrhea 05/09/2016  . Nausea without vomiting 06/11/2016    PSH:   Past Surgical History  Procedure Laterality Date  . Pacemaker insertion    . Colon surgery  1196  . Esophagogastroduodenoscopy N/A 08/13/2015    Procedure: ESOPHAGOGASTRODUODENOSCOPY (EGD);  Surgeon: Ladene Artist, MD;  Location[facility] ENDOSCOPY;  Service: Endoscopy;  Laterality: N/A;  . Tonsillectomy Bilateral   . Esophagogastroduodenoscopy N/A 05/01/2016    Procedure: ESOPHAGOGASTRODUODENOSCOPY (EGD);  Surgeon: Milus Banister, MD;  Location: [place];  Service: Endoscopy;  Laterality: N/A;  . Nasal endoscopy with epistaxis control N/A 05/03/2016    Procedure: NASAL ENDOSCOPY WITH EPISTAXIS CONTROL;  Surgeon: Melida Quitter, MD;  Location: [place];  Service: ENT;  Laterality: N/A;  Dr Oretha Caprice to follow with upper endoscopy  . Esophagogastroduodenoscopy N/A 05/03/2016    Procedure: ESOPHAGOGASTRODUODENOSCOPY (EGD);  Surgeon: Milus Banister, MD;  Location: [place];  Service: Gastroenterology;  Laterality: N/A;    Allergies: No Known Allergies  Medications:   Prior to Admission medications   Medication Sig Start Date End Date Taking? Authorizing Provider  b complex-vitamin c-folic acid (NEPHRO-VITE) 0.8 MG TABS tablet Take 1 tablet by mouth daily. 06/01/16  Yes Mosie Lukes, MD  cyanocobalamin  (,VITAMIN B-12,) 1000 MCG/ML injection Inject 1,000 mcg into the muscle every 30 (thirty) days. 1st of every month   Yes Historical Provider, MD  digoxin (LANOXIN) 0.125 MG tablet Take 1 tablet (0.125 mg total) by mouth 3 (three) times a week. Mon, Wed, Fri Patient taking differently: Take 0.125 mg by mouth every Monday, Wednesday, and Friday. Mon, Wed, Fri 05/09/16  Yes Mosie Lukes, MD  feeding supplement, ENSURE ENLIVE, (ENSURE ENLIVE) LIQD Take 237 mLs by mouth 2 (two) times daily between meals. 08/15/15  Yes Olam Idler, MD  ferrous sulfate 325 (65 FE) MG EC tablet Take 325 mg by mouth 2 (two) times daily with a meal.    Yes Historical Provider, MD  Hydrocortisone (GERHARDT'S BUTT CREAM) CREA Apply 1 application topically 3 (three) times daily. Patient taking differently: Apply 1 application topically 3 (three) times daily as needed for irritation.  05/31/16  Yes Mosie Lukes, MD  KLOR-CON M20 20 MEQ tablet TAKE TWO TABLETS BY MOUTH IN THE MORNING, THEN TAKE ONE IN THE EVENING 06/07/16  Yes Jolaine Artist, MD  lactulose (CHRONULAC) 10 GM/15ML solution Take 45 mLs (30 g total) by mouth 3 (three) times daily. Titrate to 3-4 BM's day Patient taking differently: Take 30 g by mouth 3 (three) times daily. Titrate to 2-3 BM's day 04/23/16  Yes Ripudeep Krystal Eaton, MD  Magnesium 250 MG TABS Take 250 mg by mouth daily.    Yes Historical Provider, MD  metolazone (ZAROXOLYN) 2.5 MG tablet Take 1 tablet (2.5 mg total) by mouth as needed (for weight of 171 lb or greater). 03/27/16  Yes Jolaine Artist, MD  ondansetron (ZOFRAN) 4 MG tablet Take 1 tablet (4 mg total) by mouth every 8 (eight) hours as needed for nausea or vomiting. 05/05/16  Yes Reyne Dumas, MD  oxymetazoline (AFRIN) 0.05 % nasal spray Place 1 spray into both nostrils 2 (two) times daily. Patient taking differently: Place 1 spray into both nostrils 2 (two) times daily as needed for congestion.  05/05/16  Yes Reyne Dumas, MD  pantoprazole  (PROTONIX) 40 MG tablet TAKE ONE TABLET BY MOUTH ONCE DAILY 05/24/16  Yes Jolaine Artist, MD  rifaximin (XIFAXAN) 550 MG TABS tablet Take 1 tablet (550 mg total) by mouth 2 (two) times daily. 05/05/16  Yes Reyne Dumas, MD  saline (AYR) GEL Place 1 application into the nose 2 (two) times daily as needed (for nose bleeds).   Yes Historical Provider, MD  spironolactone (ALDACTONE) 50 MG tablet Take 50 mg (1 tab) in am and 25 mg (1/2 tab) in pm. Patient taking differently: Take 25-50 mg by mouth 2 (two) times daily. Take 50 mg (1 tab) in am and 25 mg (1/2 tab) in pm. 03/20/16  Yes Jolaine Artist, MD  torsemide (DEMADEX) 20 MG tablet Take 4 tablets in the AM and 2 tablets in the PM Patient taking differently: Take 40-80 mg by mouth 2 (two) times daily. Take 4 tablets in the AM and 2 tablets in the PM 06/07/16  Yes Jolaine Artist, MD  acyclovir (ZOVIRAX) 400 MG tablet Take 2 tablets by mouth 5 (five) times daily. Reported on 06/14/2016 06/01/16 06/14/16  Historical Provider, MD  feeding supplement, ENSURE ENLIVE, (ENSURE ENLIVE) LIQD Take 237 mLs by mouth 2 (two) times daily between meals. 05/05/16   Reyne Dumas, MD  lactulose (CHRONULAC) 10 GM/15ML solution TAKE 45MLS BY MOUTH TWICE DAILY 06/05/16   Mosie Lukes, MD  promethazine (PHENERGAN) 12.5 MG tablet Take 0.5 tablets (6.25 mg total) by mouth every 8 (eight) hours as needed for nausea or vomiting. Patient taking differently: Take 6.25 mg by mouth daily as needed for nausea or vomiting.  04/23/16   Ripudeep Krystal Eaton, MD  SYRINGE-NEEDLE, DISP, 3 ML 25G X 5/8" 3 ML MISC To use with B12 injections 03/09/16   Mosie Lukes, MD    Discontinued Meds:   Medications Discontinued During This Encounter  Medication Reason  . furosemide (LASIX) injection 40 mg     Social History:  reports that he has never smoked. He has never used smokeless tobacco. He reports that he does not drink alcohol or use illicit drugs.  Family History:   Family History   Problem Relation Age of Onset  . Other Mother     HHT  . Other Son     HHT  . Heart failure Father   . Heart disease Sister     afib, pacer  . Arthritis Brother     b/l TKR  . Other Brother   . Heart disease Sister  pacer    Blood pressure 109/53, pulse 80, temperature 97.8 F (36.6 C), temperature source Oral, resp. rate 18, SpO2 98 %. General appearance: alert, cooperative and appears stated age Eyes: negative Neck: no adenopathy, no carotid bruit, supple, symmetrical, trachea midline and thyroid not enlarged, symmetric, no tenderness/mass/nodules Back: symmetric, no curvature. ROM normal. No CVA tenderness. Resp: rales bibasilar Chest wall: no tenderness Cardio: regular rate and rhythm, S1, S2 normal, no murmur, click, rub or gallop GI: soft, non-tender; bowel sounds normal; no masses,  no organomegaly Extremities: edema 1+ Pulses: 2+ and symmetric Lymph nodes: Cervical, supraclavicular, and axillary nodes normal. Neurologic: Grossly normal       Elmore Hyslop, Hunt Oris, MD 06/14/2016, 8:44 PM

## 2016-06-14 NOTE — Telephone Encounter (Signed)
[facility] RN called to report after just entering home that patient seemed in very poor condition and wanted ok to send patient to ED. Reports patient RR in 30's, lips dusky, rales and decreased sounds throughout bilaterally, accessory breathing, pursed lip breathing and very irregular thready HR (difficult to get count), bowel sounds hyperactive and "sloshy". Advised to call 911 immediately and have patient transported to [facility] ED. RN on phone aware, agreeable, and voiced understanding of plan and to activate 911. Will make CHF team aware.  Renee Pain

## 2016-06-14 NOTE — ED Provider Notes (Signed)
CSN: [id number]     Arrival date & time 06/14/16  1625 History   First MD Initiated Contact with Patient 06/14/16 1644     Chief Complaint  Patient presents with  . Shortness of Breath  . swelling      (Consider location/radiation/quality/duration/timing/severity/associated sxs/prior Treatment) Patient is a 81 y.o. male presenting with shortness of breath. The history is provided by the patient and medical records.  Shortness of Breath   81 y.o. M with hx of AFIB, osler-weber-rendu syndrome, cirrhosis, anemia, presenting to the ED for SOB.  Over the past few days patient has had increased work of breathing, but substantially worse this morning.  Heart failure nurse went to his home this morning for routine check and advised him to come to the ED for evaluation.  Wife reports patient has had increased fatigue, SOB, and difficulty with exertional activities lately.  States even walking to the bathroom has become a chore for him.  She states by the time he gets back to his chair he appears exhausted.  Patient denies any chest pain, just states he feels SOB.  No dizziness or confusion.  No fever, chills, cough, sore throat, or other URI symptoms.  Has been taking his home meds as directed-- recently increased demedex to 80mg  morning, 40mg  evening.  Wife did give him a dose of his breakthrough metolazone today, no significant improvement.  Last 2D echo in august 2016 with estimated 45-50% EF.  Primary cardiologist is Dr. Haroldine Laws.  VSS.  Past Medical History  Diagnosis Date  . CHF (congestive heart failure) ([place])   . Pacemaker     single lead Medtronic pacemaker  . 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 Anemia   . Pulmonary hypertension ([place])   . 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 Renal insufficiency   . 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   . Atrial fibrillation ([place])   . Hepatic AV fistula ([place]) 08/09/2015  . 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 Anemia  due to chronic blood loss 08/09/2015  . Right heart failure ([place]) 08/09/2015  . Tricuspid regurgitation 08/09/2015  . Melanoma ([place])     2 on nose, 1 on back, 1 on leg  . Diarrhea 05/09/2016  . Nausea without vomiting 06/11/2016   Past Surgical History  Procedure Laterality Date  . Pacemaker insertion    . Colon surgery  1196  . Esophagogastroduodenoscopy N/A 08/13/2015    Procedure: ESOPHAGOGASTRODUODENOSCOPY (EGD);  Surgeon: Ladene Artist, MD;  Location[facility] ENDOSCOPY;  Service: Endoscopy;  Laterality: N/A;  . Tonsillectomy Bilateral   . Esophagogastroduodenoscopy N/A 05/01/2016    Procedure: ESOPHAGOGASTRODUODENOSCOPY (EGD);  Surgeon: Milus Banister, MD;  Location: [place];  Service: Endoscopy;  Laterality: N/A;  . Nasal endoscopy with epistaxis control N/A 05/03/2016    Procedure: NASAL ENDOSCOPY WITH EPISTAXIS CONTROL;  Surgeon: Melida Quitter, MD;  Location: [place];  Service: ENT;  Laterality: N/A;  Dr Oretha Caprice to follow with upper endoscopy  . Esophagogastroduodenoscopy N/A 05/03/2016    Procedure: ESOPHAGOGASTRODUODENOSCOPY (EGD);  Surgeon: Milus Banister, MD;  Location: [place];  Service: Gastroenterology;  Laterality: N/A;   Family History  Problem Relation Age of Onset  . Other Mother     HHT  . Other Son     HHT  . Heart failure Father   . Heart disease Sister     afib, pacer  . Arthritis Brother     b/l TKR  .  Other Brother   . Heart disease Sister     pacer   Social History  Substance Use Topics  . Smoking status: Never Smoker   . Smokeless tobacco: Never Used  . Alcohol Use: No    Review of Systems  Respiratory: Positive for shortness of breath.   Cardiovascular: Positive for leg swelling.  All other systems reviewed and are negative.     Allergies  Review of patient's allergies indicates no known allergies.  Home Medications   Prior to Admission medications   Medication Sig Start Date End Date Taking? Authorizing Provider  b complex-vitamin c-folic  acid (NEPHRO-VITE) 0.8 MG TABS tablet Take 1 tablet by mouth daily. 06/01/16   Mosie Lukes, MD  cyanocobalamin (,VITAMIN B-12,) 1000 MCG/ML injection Inject 1,000 mcg into the muscle every 30 (thirty) days. 1st of every month    Historical Provider, MD  digoxin (LANOXIN) 0.125 MG tablet Take 1 tablet (0.125 mg total) by mouth 3 (three) times a week. Jory Sims, Fri 05/09/16   Mosie Lukes, MD  feeding supplement, ENSURE ENLIVE, (ENSURE ENLIVE) LIQD Take 237 mLs by mouth 2 (two) times daily between meals. 08/15/15   Olam Idler, MD  feeding supplement, ENSURE ENLIVE, (ENSURE ENLIVE) LIQD Take 237 mLs by mouth 2 (two) times daily between meals. 05/05/16   Reyne Dumas, MD  ferrous sulfate 325 (65 FE) MG EC tablet Take 325 mg by mouth 2 (two) times daily with a meal.     Historical Provider, MD  Hydrocortisone (GERHARDT'S BUTT CREAM) CREA Apply 1 application topically 3 (three) times daily. 05/31/16   Mosie Lukes, MD  KLOR-CON M20 20 MEQ tablet TAKE TWO TABLETS BY MOUTH IN THE MORNING, THEN TAKE ONE IN THE EVENING 06/07/16   Jolaine Artist, MD  lactulose (CHRONULAC) 10 GM/15ML solution Take 45 mLs (30 g total) by mouth 3 (three) times daily. Titrate to 3-4 BM's day 04/23/16   Ripudeep Krystal Eaton, MD  lactulose (CHRONULAC) 10 GM/15ML solution TAKE 45MLS BY MOUTH TWICE DAILY 06/05/16   Mosie Lukes, MD  Magnesium 250 MG TABS Take 250 mg by mouth daily.     Historical Provider, MD  metolazone (ZAROXOLYN) 2.5 MG tablet Take 1 tablet (2.5 mg total) by mouth as needed (for weight of 171 lb or greater). 03/27/16   Jolaine Artist, MD  ondansetron (ZOFRAN) 4 MG tablet Take 1 tablet (4 mg total) by mouth every 8 (eight) hours as needed for nausea or vomiting. 05/05/16   Reyne Dumas, MD  oxymetazoline (AFRIN) 0.05 % nasal spray Place 1 spray into both nostrils 2 (two) times daily. 05/05/16   Reyne Dumas, MD  pantoprazole (PROTONIX) 40 MG tablet TAKE ONE TABLET BY MOUTH ONCE DAILY 05/24/16   Jolaine Artist, MD   promethazine (PHENERGAN) 12.5 MG tablet Take 0.5 tablets (6.25 mg total) by mouth every 8 (eight) hours as needed for nausea or vomiting. Patient taking differently: Take 6.25 mg by mouth daily as needed for nausea or vomiting.  04/23/16   Ripudeep Krystal Eaton, MD  rifaximin (XIFAXAN) 550 MG TABS tablet Take 1 tablet (550 mg total) by mouth 2 (two) times daily. 05/05/16   Reyne Dumas, MD  saline (AYR) GEL Place 1 application into the nose 2 (two) times daily as needed (for nose bleeds).    Historical Provider, MD  spironolactone (ALDACTONE) 50 MG tablet Take 50 mg (1 tab) in am and 25 mg (1/2 tab) in pm. 03/20/16   Quillian Quince  R Bensimhon, MD  SYRINGE-NEEDLE, DISP, 3 ML 25G X 5/8" 3 ML MISC To use with B12 injections 03/09/16   Mosie Lukes, MD  torsemide (DEMADEX) 20 MG tablet Take 4 tablets in the AM and 2 tablets in the PM 06/07/16   Shaune Pascal Bensimhon, MD   BP 104/58 mmHg  Pulse 86  Temp(Src) 97.8 F (36.6 C) (Oral)  Resp 22  SpO2 100%   Physical Exam  Constitutional: He is oriented to person, place, and time. He appears well-developed and well-nourished. No distress.  HENT:  Head: Normocephalic and atraumatic.  Mouth/Throat: Oropharynx is clear and moist.  Eyes: Conjunctivae and EOM are normal. Pupils are equal, round, and reactive to light.  Neck: Normal range of motion. Neck supple. JVD present.  JVD noted  Cardiovascular: Normal rate, regular rhythm and normal heart sounds.   Pulmonary/Chest: Effort normal. No respiratory distress. He has no wheezes. He has rales.  Slightly increased work of breathing, providing minimal verbal responses, rales noted on exam at bases; O2 sats 100% on RA  Abdominal: Soft. Bowel sounds are normal. There is no tenderness. There is no guarding and no CVA tenderness.  Musculoskeletal: Normal range of motion.  2+ pitting edema BLE  Neurological: He is alert and oriented to person, place, and time.  Skin: Skin is warm and dry. He is not diaphoretic.  Psychiatric:  He has a normal mood and affect.  Nursing note and vitals reviewed.   ED Course  Procedures (including critical care time) Labs Review Labs Reviewed  CBC WITH DIFFERENTIAL/PLATELET - Abnormal; Notable for the following:    RBC 2.84 (*)    Hemoglobin 8.0 (*)    HCT 25.0 (*)    RDW 18.3 (*)    Platelets 72 (*)    Lymphs Abs 0.6 (*)    All other components within normal limits  BASIC METABOLIC PANEL - Abnormal; Notable for the following:    Sodium 123 (*)    Potassium 5.8 (*)    Chloride 93 (*)    CO2 20 (*)    Glucose, Bld 122 (*)    BUN 76 (*)    Creatinine, Ser 3.26 (*)    Calcium 8.6 (*)    GFR calc non Af Amer 17 (*)    GFR calc Af Amer 19 (*)    All other components within normal limits  BRAIN NATRIURETIC PEPTIDE - Abnormal; Notable for the following:    B Natriuretic Peptide 340.4 (*)    All other components within normal limits  DIGOXIN LEVEL - Abnormal; Notable for the following:    Digoxin Level 0.7 (*)    All other components within normal limits  COMPREHENSIVE METABOLIC PANEL - Abnormal; Notable for the following:    Sodium 124 (*)    Potassium 5.9 (*)    Chloride 94 (*)    CO2 20 (*)    Glucose, Bld 117 (*)    BUN 76 (*)    Creatinine, Ser 3.34 (*)    Calcium 8.6 (*)    Total Protein 5.3 (*)    Albumin 2.8 (*)    Alkaline Phosphatase 165 (*)    Total Bilirubin 2.9 (*)    GFR calc non Af Amer 16 (*)    GFR calc Af Amer 19 (*)    All other components within normal limits  AMMONIA - Abnormal; Notable for the following:    Ammonia 118 (*)    All other components within normal limits  TROPONIN  I - Abnormal; Notable for the following:    Troponin I 0.08 (*)    All other components within normal limits  TROPONIN I  TROPONIN I  URINALYSIS, ROUTINE W REFLEX MICROSCOPIC (NOT AT [facility])  OSMOLALITY, URINE  I-STAT TROPOININ, ED    Imaging Review Dg Chest 2 View  06/14/2016  CLINICAL DATA:  Increasing shortness of breath and swelling since this morning, CHF,  cirrhosis, atrial fibrillation, pulmonary hypertension, history melanoma EXAM: CHEST  2 VIEW COMPARISON:  05/01/2016 FINDINGS: LEFT subclavian transvenous pacemaker lead projects over RIGHT ventricle. Enlargement of cardiac silhouette with pulmonary vascular congestion. Atherosclerotic calcification aorta. Perihilar infiltrates greater on LEFT question asymmetric edema versus infection. Subsegmental atelectasis LEFT base. No pleural effusion or pneumothorax Bones demineralized. IMPRESSION: Enlargement of cardiac silhouette with pulmonary vascular congestion. Accentuated perihilar markings particularly on LEFT question asymmetric edema versus infection. Aortic atherosclerosis and pacemaker noted. Electronically Signed   By: Lavonia Dana M.D.   On: 06/14/2016 17:57   I have personally reviewed and evaluated these images and lab results as part of my medical decision-making.   EKG Interpretation None      MDM   Final diagnoses:  Acute on chronic renal failure (HCC)  Hyperkalemia  Acute on chronic diastolic (congestive) heart failure ([place])   81 y.o. M here with apparent CHF and fluid overload.  He is afebrile, non-toxic.  He has noted JVD, rales at lung bases, and pitting edema of the legs.  VSS on RA at this time.  Labs with elevated BNP and new renal failure.  Edema noted on CXR.  Question if new renal failure from increased diuretics vs purely renal source.  Patient not in any acute distress at this time. Hesitant to give further diuretics given his renal failure.   Patient also with mildly elevated ammonia, appears to be somewhat of a chronic issue. Will speak with admitting team prior to starting further meds as to not exacerbate current situation.  Case discussed with cardiology, Dr. Radford Pax-- feels hesitant to give further diuretics at this time.  Recommends to discuss with renal regarding continuing diuretics vs dialysis.  Will have Dr. Haroldine Laws see patient in the morning as he is familiar with  patient.  Discussed with nephrology as well, Dr. Augustin Coupe-- will come see patient and provide recommendations as well.  Admit to hospitalist,-- Dr. Roel Cluck, tele, inpatient.  Temp admit orders placed.  VSS.  Larene Pickett, PA-C 06/14/16 2040  Leo Grosser, MD 06/15/16 [phone]

## 2016-06-15 ENCOUNTER — Other Ambulatory Visit: Payer: Medicare Other

## 2016-06-15 ENCOUNTER — Inpatient Hospital Stay (HOSPITAL_COMMUNITY): Payer: Medicare Other

## 2016-06-15 ENCOUNTER — Ambulatory Visit: Payer: Medicare Other | Admitting: Hematology & Oncology

## 2016-06-15 ENCOUNTER — Ambulatory Visit: Payer: Medicare Other

## 2016-06-15 DIAGNOSIS — I5031 Acute diastolic (congestive) heart failure: Secondary | ICD-10-CM

## 2016-06-15 DIAGNOSIS — D500 Iron deficiency anemia secondary to blood loss (chronic): Secondary | ICD-10-CM

## 2016-06-15 DIAGNOSIS — I5033 Acute on chronic diastolic (congestive) heart failure: Secondary | ICD-10-CM

## 2016-06-15 DIAGNOSIS — I509 Heart failure, unspecified: Secondary | ICD-10-CM

## 2016-06-15 DIAGNOSIS — K761 Chronic passive congestion of liver: Secondary | ICD-10-CM

## 2016-06-15 DIAGNOSIS — L8990 Pressure ulcer of unspecified site, unspecified stage: Secondary | ICD-10-CM | POA: Diagnosis present

## 2016-06-15 LAB — CBC
HCT: 23.3 % — ABNORMAL LOW (ref 39.0–52.0)
HEMOGLOBIN: 7.6 g/dL — AB (ref 13.0–17.0)
MCH: 28.5 pg (ref 26.0–34.0)
MCHC: 32.6 g/dL (ref 30.0–36.0)
MCV: 87.3 fL (ref 78.0–100.0)
Platelets: 59 10*3/uL — ABNORMAL LOW (ref 150–400)
RBC: 2.67 MIL/uL — AB (ref 4.22–5.81)
RDW: 18.4 % — ABNORMAL HIGH (ref 11.5–15.5)
WBC: 4.7 10*3/uL (ref 4.0–10.5)

## 2016-06-15 LAB — URINALYSIS, ROUTINE W REFLEX MICROSCOPIC
BILIRUBIN URINE: NEGATIVE
Glucose, UA: NEGATIVE mg/dL
Ketones, ur: NEGATIVE mg/dL
NITRITE: NEGATIVE
PH: 6.5 (ref 5.0–8.0)
Protein, ur: 100 mg/dL — AB
SPECIFIC GRAVITY, URINE: 1.011 (ref 1.005–1.030)

## 2016-06-15 LAB — ECHOCARDIOGRAM COMPLETE
AO mean calculated velocity dopler: 143 cm/s
AOPV: 0.73 m/s
AV peak Index: 1.25
AV pk vel: 205 cm/s
AV vel: 2.4
AVAREAMEANV: 2.24 cm2
AVAREAMEANVIN: 1.11 cm2/m2
AVAREAVTI: 2.51 cm2
AVAREAVTIIND: 1.19 cm2/m2
AVCELMEANRAT: 0.65
AVG: 9 mmHg
AVPG: 17 mmHg
CHL CUP AV VALUE AREA INDEX: 1.19
CHL CUP DOP CALC LVOT VTI: 28.8 cm
FS: 27 % — AB (ref 28–44)
Height: 72 in
IVS/LV PW RATIO, ED: 1.07
LA diam index: 3.33 cm/m2
LA vol A4C: 254 ml
LA vol: 249 mL
LASIZE: 67 mm
LAVOLIN: 123.9 mL/m2
LDCA: 3.46 cm2
LEFT ATRIUM END SYS DIAM: 67 mm
LVOT SV: 100 mL
LVOT peak VTI: 0.69 cm
LVOT peak grad rest: 9 mmHg
LVOTD: 21 mm
LVOTPV: 149 cm/s
PISA EROA: 0.34 cm2
PW: 14 mm — AB (ref 0.6–1.1)
Reg peak vel: 364 cm/s
TAPSE: 17.5 mm
TRMAXVEL: 364 cm/s
VTI: 122 cm
VTI: 41.5 cm
Valve area: 2.4 cm2
WEIGHTICAEL: 2792 [oz_av]

## 2016-06-15 LAB — URINE MICROSCOPIC-ADD ON

## 2016-06-15 LAB — SODIUM, URINE, RANDOM: Sodium, Ur: 41 mmol/L

## 2016-06-15 LAB — OSMOLALITY, URINE: OSMOLALITY UR: 286 mosm/kg — AB (ref 300–900)

## 2016-06-15 LAB — COMPREHENSIVE METABOLIC PANEL
ALK PHOS: 154 U/L — AB (ref 38–126)
ALT: 22 U/L (ref 17–63)
ANION GAP: 8 (ref 5–15)
AST: 35 U/L (ref 15–41)
Albumin: 2.5 g/dL — ABNORMAL LOW (ref 3.5–5.0)
BILIRUBIN TOTAL: 2.7 mg/dL — AB (ref 0.3–1.2)
BUN: 81 mg/dL — ABNORMAL HIGH (ref 6–20)
CALCIUM: 9 mg/dL (ref 8.9–10.3)
CO2: 22 mmol/L (ref 22–32)
Chloride: 95 mmol/L — ABNORMAL LOW (ref 101–111)
Creatinine, Ser: 3.44 mg/dL — ABNORMAL HIGH (ref 0.61–1.24)
GFR, EST AFRICAN AMERICAN: 18 mL/min — AB (ref >60)
GFR, EST NON AFRICAN AMERICAN: 16 mL/min — AB (ref >60)
Glucose, Bld: 88 mg/dL (ref 65–99)
Potassium: 5.1 mmol/L (ref 3.5–5.1)
SODIUM: 125 mmol/L — AB (ref 135–145)
TOTAL PROTEIN: 5.1 g/dL — AB (ref 6.5–8.1)

## 2016-06-15 LAB — TROPONIN I
Troponin I: 0.07 ng/mL (ref <0.03)
Troponin I: 0.08 ng/mL (ref <0.03)

## 2016-06-15 LAB — PHOSPHORUS: Phosphorus: 5.8 mg/dL — ABNORMAL HIGH (ref 2.5–4.6)

## 2016-06-15 LAB — MAGNESIUM: MAGNESIUM: 3 mg/dL — AB (ref 1.7–2.4)

## 2016-06-15 LAB — CREATININE, URINE, RANDOM: Creatinine, Urine: 28.7 mg/dL

## 2016-06-15 LAB — TSH: TSH: 7.642 u[IU]/mL — ABNORMAL HIGH (ref 0.350–4.500)

## 2016-06-15 MED ORDER — FUROSEMIDE 10 MG/ML IJ SOLN
80.0000 mg | Freq: Two times a day (BID) | INTRAMUSCULAR | Status: DC
  Administered 2016-06-15 – 2016-06-17 (×5): 80 mg via INTRAVENOUS
  Filled 2016-06-15 (×5): qty 8

## 2016-06-15 MED ORDER — DARBEPOETIN ALFA 100 MCG/0.5ML IJ SOSY
100.0000 ug | PREFILLED_SYRINGE | INTRAMUSCULAR | Status: DC
  Administered 2016-06-15: 100 ug via SUBCUTANEOUS
  Filled 2016-06-15 (×2): qty 0.5

## 2016-06-15 MED ORDER — DEXTROSE 5 % IV SOLN
1.0000 g | INTRAVENOUS | Status: DC
  Administered 2016-06-15 – 2016-06-22 (×8): 1 g via INTRAVENOUS
  Filled 2016-06-15 (×9): qty 10

## 2016-06-15 NOTE — Care Management Important Message (Signed)
Important Message  Patient Details  Name: Hunter [name] MRN: [id number] Date of Birth: 1935-01-09   Medicare Important Message Given:  Yes    [name] 06/15/2016, 8:59 AM

## 2016-06-15 NOTE — Progress Notes (Signed)
  Echocardiogram 2D Echocardiogram has been performed.  Hunter [name] 06/15/2016, 12:22 PM

## 2016-06-15 NOTE — Consult Note (Signed)
Advanced Heart Failure Team Consult Note  Referring Provider: Dr Doyle Askew Primary Cardiologist: Dr. Claude Manges, in [place], had also been acting as PCP Primary HF: Dr. Haroldine Laws  PCP: Randel Pigg  Reason for Consultation: A/C combined CHF in setting of AKI.   HPI:    Hunter [name] is a 81 y.o. male with a history of diastolic HF Echo 3/54/65 EF 45-50% NYHA class III, pulmonary hypertension, liver failure, CKD stage III, and hereditary hemorrhagic telangiectasia with hepatic AVMs and associated anemia requiring transfusion.  Last seen in HF clinic 04/21/16, stable from HF standpoint with previous changes in diuretic regimen.  Did have some N/V and encouraged to contact GI if it continued. Weight 171 lbs at that visit.   He presented to ED on 06/14/16 with worsening SOB over several days that acutely worsened that morning. Wife gave him a dose of metolazone without symptomatic improvement.  Pertinent labs on admission include K 5.9, Creatinine 3.34, BNP 340.4, Hgb 7.6.  Troponin mildly elevated but flat (0.08 -> 0.07 -> 0.08). UA with many bacteria and Large Hgb, moderate leukocytes and protein.  CXR showed pulmonary vascular congestion and accentuated perihilar markings particularly on LEFT question asymmetric edema versus infection.   Nephrology consulted 06/14/16 and given Lasix 120 mg IV dosing. He has recorded output of 1.7 L yesterday, and his weight shows down 8 lbs.   He remains weak. No sob. No CP.   Review of Systems: [y] = yes, [ ] = no   General: Weight gain Blue.Reese ]; Weight loss [ ]; Anorexia [ ]; Fatigue Blue.Reese ]; Fever [ ]; Chills [ ]; Weakness [ ]  Cardiac: Chest pain/pressure [ ]; Resting SOB [ ]; Exertional SOB [ ]; Orthopnea [ ]; Pedal Edema Blue.Reese ]; Palpitations [ ]; Syncope [ ]; Presyncope [ ]; Paroxysmal nocturnal dyspnea[ ]  Pulmonary: Cough [ ]; Wheezing[ ]; Hemoptysis[ ]; Sputum [ ]; Snoring [ ]  GI: Vomiting[ ]; Dysphagia[ ]; Melena[ ]; Hematochezia [ ]; Heartburn[ ]; Abdominal  pain [ ]; Constipation [ ]; Diarrhea [ ]; BRBPR [ ]  GU: Hematuria[ ]; Dysuria [ ]; Nocturia[ ]  Vascular: Pain in legs with walking [ ]; Pain in feet with lying flat [ ]; Non-healing sores [ ]; Stroke [ ]; TIA [ ]; Slurred speech [ ];  Neuro: Headaches[ ]; Vertigo[ ]; Seizures[ ]; Paresthesias[ ];Blurred vision [ ]; Diplopia [ ]; Vision changes [ ]  Ortho/Skin: Arthritis [ y]; Joint pain [y]; Muscle pain [ ]; Joint swelling [ ]; Back Pain [ ]; Rash [ ]  Psych: Depression[ ]; Anxiety[ ]  Heme: Bleeding problems Blue.Reese ]; Clotting disorders [ ]; Anemia Blue.Reese ]  Endocrine: Diabetes [ ]; Thyroid dysfunction[ ]  Home Medications Prior to Admission medications   Medication Sig Start Date End Date Taking? Authorizing Provider  b complex-vitamin c-folic acid (NEPHRO-VITE) 0.8 MG TABS tablet Take 1 tablet by mouth daily. 06/01/16  Yes Mosie Lukes, MD  cyanocobalamin (,VITAMIN B-12,) 1000 MCG/ML injection Inject 1,000 mcg into the muscle every 30 (thirty) days. 1st of every month   Yes Historical Provider, MD  digoxin (LANOXIN) 0.125 MG tablet Take 1 tablet (0.125 mg total) by mouth 3 (three) times a week. Mon, Wed, Fri Patient taking differently: Take 0.125 mg by mouth every Monday, Wednesday, and Friday. Mon, Wed, Fri 05/09/16  Yes Mosie Lukes, MD  feeding supplement, ENSURE ENLIVE, (ENSURE ENLIVE) LIQD Take 237 mLs by mouth 2 (two) times daily between meals.  08/15/15  Yes Olam Idler, MD  ferrous sulfate 325 (65 FE) MG EC tablet Take 325 mg by mouth 2 (two) times daily with a meal.    Yes Historical Provider, MD  Hydrocortisone (GERHARDT'S BUTT CREAM) CREA Apply 1 application topically 3 (three) times daily. Patient taking differently: Apply 1 application topically 3 (three) times daily as needed for irritation.  05/31/16  Yes Mosie Lukes, MD  KLOR-CON M20 20 MEQ tablet TAKE TWO TABLETS BY MOUTH IN THE MORNING, THEN TAKE ONE IN THE EVENING 06/07/16  Yes Jolaine Artist, MD  lactulose (CHRONULAC) 10  GM/15ML solution Take 45 mLs (30 g total) by mouth 3 (three) times daily. Titrate to 3-4 BM's day Patient taking differently: Take 30 g by mouth 3 (three) times daily. Titrate to 2-3 BM's day 04/23/16  Yes Ripudeep Krystal Eaton, MD  Magnesium 250 MG TABS Take 250 mg by mouth daily.    Yes Historical Provider, MD  metolazone (ZAROXOLYN) 2.5 MG tablet Take 1 tablet (2.5 mg total) by mouth as needed (for weight of 171 lb or greater). 03/27/16  Yes Jolaine Artist, MD  ondansetron (ZOFRAN) 4 MG tablet Take 1 tablet (4 mg total) by mouth every 8 (eight) hours as needed for nausea or vomiting. 05/05/16  Yes Reyne Dumas, MD  oxymetazoline (AFRIN) 0.05 % nasal spray Place 1 spray into both nostrils 2 (two) times daily. Patient taking differently: Place 1 spray into both nostrils 2 (two) times daily as needed for congestion.  05/05/16  Yes Reyne Dumas, MD  pantoprazole (PROTONIX) 40 MG tablet TAKE ONE TABLET BY MOUTH ONCE DAILY 05/24/16  Yes Jolaine Artist, MD  rifaximin (XIFAXAN) 550 MG TABS tablet Take 1 tablet (550 mg total) by mouth 2 (two) times daily. 05/05/16  Yes Reyne Dumas, MD  saline (AYR) GEL Place 1 application into the nose 2 (two) times daily as needed (for nose bleeds).   Yes Historical Provider, MD  spironolactone (ALDACTONE) 50 MG tablet Take 50 mg (1 tab) in am and 25 mg (1/2 tab) in pm. Patient taking differently: Take 25-50 mg by mouth 2 (two) times daily. Take 50 mg (1 tab) in am and 25 mg (1/2 tab) in pm. 03/20/16  Yes Jolaine Artist, MD  torsemide (DEMADEX) 20 MG tablet Take 4 tablets in the AM and 2 tablets in the PM Patient taking differently: Take 40-80 mg by mouth 2 (two) times daily. Take 4 tablets in the AM and 2 tablets in the PM 06/07/16  Yes Jolaine Artist, MD  feeding supplement, ENSURE ENLIVE, (ENSURE ENLIVE) LIQD Take 237 mLs by mouth 2 (two) times daily between meals. 05/05/16   Reyne Dumas, MD  lactulose (CHRONULAC) 10 GM/15ML solution TAKE 45MLS BY MOUTH TWICE DAILY  06/05/16   Mosie Lukes, MD  promethazine (PHENERGAN) 12.5 MG tablet Take 0.5 tablets (6.25 mg total) by mouth every 8 (eight) hours as needed for nausea or vomiting. Patient taking differently: Take 6.25 mg by mouth daily as needed for nausea or vomiting.  04/23/16   Ripudeep Krystal Eaton, MD  SYRINGE-NEEDLE, DISP, 3 ML 25G X 5/8" 3 ML MISC To use with B12 injections 03/09/16   Mosie Lukes, MD    Past Medical History: Past Medical History  Diagnosis Date  . CHF (congestive heart failure) ([place])   . Pacemaker     single lead Medtronic pacemaker  . HHT (hereditary hemorrhagic telangiectasia) ([place])   . Anemia   . Pulmonary hypertension (  [place])   . 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 Renal insufficiency   . 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   . Atrial fibrillation ([place])   . Hepatic AV fistula ([place]) 08/09/2015  . 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 Anemia due to chronic blood loss 08/09/2015  . Right heart failure ([place]) 08/09/2015  . Tricuspid regurgitation 08/09/2015  . Melanoma ([place])     2 on nose, 1 on back, 1 on leg  . Diarrhea 05/09/2016  . Nausea without vomiting 06/11/2016    Past Surgical History: Past Surgical History  Procedure Laterality Date  . Pacemaker insertion    . Colon surgery  1196  . Esophagogastroduodenoscopy N/A 08/13/2015    Procedure: ESOPHAGOGASTRODUODENOSCOPY (EGD);  Surgeon: Ladene Artist, MD;  [facility] ENDOSCOPY;  Service: Endoscopy;  Laterality: N/A;  . Tonsillectomy Bilateral   . Esophagogastroduodenoscopy N/A 05/01/2016    Procedure: ESOPHAGOGASTRODUODENOSCOPY (EGD);  Surgeon: Milus Banister, MD;  Location: [place];  Service: Endoscopy;  Laterality: N/A;  . Nasal endoscopy with epistaxis control N/A 05/03/2016    Procedure: NASAL ENDOSCOPY WITH EPISTAXIS CONTROL;  Surgeon: Melida Quitter, MD;  Location: [place];  Service: ENT;  Laterality: N/A;  Dr Oretha Caprice to follow with upper endoscopy  . Esophagogastroduodenoscopy N/A 05/03/2016     Procedure: ESOPHAGOGASTRODUODENOSCOPY (EGD);  Surgeon: Milus Banister, MD;  Location: [place];  Service: Gastroenterology;  Laterality: N/A;    Family History: Family History  Problem Relation Age of Onset  . Other Mother     HHT  . Other Son     HHT  . Heart failure Father   . Heart disease Sister     afib, pacer  . Arthritis Brother     b/l TKR  . Other Brother   . Heart disease Sister     pacer    Social History: Social History   Social History  . Marital Status: Married    Spouse Name: N/A  . Number of Children: 1  . Years of Education: N/A   Occupational History  . retired    Social History Main Topics  . Smoking status: Never Smoker   . Smokeless tobacco: Never Used  . Alcohol Use: No  . Drug Use: No  . 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 None   Social History Narrative   Married 1 son   Retired Therapist, art   One caffeinated beverage a day no alcohol   He was living in [employer] at the [address] met in [place] and has located to [place] recently.   08/09/2015       Allergies:  No Known Allergies  Objective:    Vital Signs:   Temp:  [97.4 F (36.3 C)-97.8 F (36.6 C)] 97.8 F (36.6 C) (07/06 0300) Pulse Rate:  [80-86] 80 (07/06 0300) Resp:  [18-22] 20 (07/06 0300) BP: (94-111)/(45-62) 94/45 mmHg (07/06 0300) SpO2:  [95 %-100 %] 95 % (07/06 0300) Weight:  [174 lb 8 oz (79.153 kg)-182 lb 5.1 oz (82.7 kg)] 174 lb 8 oz (79.153 kg) (07/06 0804) Last BM Date: 06/14/16  Weight change: Filed Weights   06/14/16 2046 06/15/16 0804  Weight: 182 lb 5.1 oz (82.7 kg) 174 lb 8 oz (79.153 kg)    Intake/Output:   Intake/Output Summary (  Last 24 hours) at 06/15/16 0924 Last data filed at 06/15/16 0851  Gross per 24 hour  Intake    360 ml  Output   3200 ml  Net  -2840 ml      Physical Exam: General: Elderly and ill appearing, NAD HEENT: Diffuse telengectasias, otherwise normal Neck: supple. JVP 10 Carotids 2+ bilat; no bruits. No thyromegaly or nodule noted Cor: PMI nondisplaced. RRR. II/VI SEM at LUSB and apex Lungs: Diminished basilar L>R Abdomen: soft, NT, ND, no HSM. No bruits or masses. +BS  Extremities: no cyanosis, clubbing, rash. Trace to 1+ LE edema, + mild asterixis Neuro: alert & orientedx3, cranial nerves grossly intact. moves all 4 extremities w/o difficulty. Affect flat.  GU: Foley in place with pink urine in bag, and darker urine in line.   Telemetry: Reviewed, V pacing in 80s  Labs: Basic Metabolic Panel:  Recent Labs Lab 06/14/16 1640 06/14/16 1741 06/15/16 0714  NA 123* 124* 125*  K 5.8* 5.9* 5.1  CL 93* 94* 95*  CO2 20* 20* 22  GLUCOSE 122* 117* 88  BUN 76* 76* 81*  CREATININE 3.26* 3.34* 3.44*  CALCIUM 8.6* 8.6* 9.0  MG  --   --  3.0*  PHOS  --   --  5.8*    Liver Function Tests:  Recent Labs Lab 06/14/16 1741 06/15/16 0714  AST 41 35  ALT 23 22  ALKPHOS 165* 154*  BILITOT 2.9* 2.7*  PROT 5.3* 5.1*  ALBUMIN 2.8* 2.5*   No results for input(s): LIPASE, AMYLASE in the last 168 hours.  Recent Labs Lab 06/14/16 1713  AMMONIA 118*    CBC:  Recent Labs Lab 06/14/16 1640 06/15/16 0714  WBC 5.7 4.7  NEUTROABS 4.5  --   HGB 8.0* 7.6*  HCT 25.0* 23.3*  MCV 88.0 87.3  PLT 72* 59*    Cardiac Enzymes:  Recent Labs Lab 06/14/16 1943 06/15/16 0136 06/15/16 0714  TROPONINI 0.08* 0.07* 0.08*    BNP: BNP (last 3 results)  Recent Labs  09/02/15 1239 06/14/16 1640  BNP 280.5* 340.4*    ProBNP (last 3 results)  Recent Labs  08/09/15 1621  PROBNP 188.0*     CBG: No results for input(s): GLUCAP in the last 168 hours.  Coagulation Studies: No results for input(s): LABPROT, INR in the last 72 hours.  Other results: EKG: V pacing at 80 bpm  Imaging: Dg Chest 2 View  06/14/2016   CLINICAL DATA:  Increasing shortness of breath and swelling since this morning, CHF, cirrhosis, atrial fibrillation, pulmonary hypertension, history melanoma EXAM: CHEST  2 VIEW COMPARISON:  05/01/2016 FINDINGS: LEFT subclavian transvenous pacemaker lead projects over RIGHT ventricle. Enlargement of cardiac silhouette with pulmonary vascular congestion. Atherosclerotic calcification aorta. Perihilar infiltrates greater on LEFT question asymmetric edema versus infection. Subsegmental atelectasis LEFT base. No pleural effusion or pneumothorax Bones demineralized. IMPRESSION: Enlargement of cardiac silhouette with pulmonary vascular congestion. Accentuated perihilar markings particularly on LEFT question asymmetric edema versus infection. Aortic atherosclerosis and pacemaker noted. Electronically Signed   By: Lavonia Dana M.D.   On: 06/14/2016 17:57      Medications:     Current Medications: . acyclovir  800 mg Oral TID  . [START ON 06/16/2016] digoxin  0.125 mg Oral Q M,W,F  . ferrous sulfate  325 mg Oral BID WC  . lactulose  30 g Oral BID  . pantoprazole  40 mg Oral Daily  . patiromer  8.4 g Oral Daily  . rifaximin  550 mg  Oral BID  . sodium chloride flush  3 mL Intravenous Q12H  . spironolactone  50 mg Oral Daily   And  . spironolactone  25 mg Oral QHS     Infusions:      Assessment   1. Acute on chronic diastolic CHF Echo 9/32/35 45-50%.  2. ARF on CKD Stage III  3. Liver failure / cirrhosis 4. Pulmonary hypertension on Echo 08/11/15 PA peak pressure 51 mm Hg 5. Acute on Chronic Anemia 6. Hereditary telangiectasia. 7. Hepatic encephalopathy 8. Hyperkalemia  Plan    Had good response to IV lasix 120 mg though still appears somewhat volume overloaded.   Will continue to let renal direct diuresis for now with possibility of RRT with poor response or worsening renal function.   Hgb 7.6 this am.  May need blood.  Likely some blood loss related to UTI on UA this am.     Will need  ABX for UTI.  Per Primary.   Pt has a guarded prognosis at this time with a multitude of medical problems.   Renal dosing diuretics.  We will continue to follow along.   Length of Stay: 1  Shirley Friar PA-C 06/15/2016, 9:24 AM  Advanced Heart Failure Team Pager [phone] (M-F; 7a - 4p)  Please contact [facility] for night-coverage after hours (4p -7a ) and weekends on [email or web address]  Patient seen and examined with Oda Kilts, PA-C. We discussed all aspects of the encounter. I agree with the assessment and plan as stated above.   He remains volume overloaded but is responding to high-dose lasix with Renal. Creatine remains elevated - suspect possible ATN. Agree with need for transfusion as tolerated.  Prognosis remains guarded. We will follow. I discussed with his wife at bedside.   Melissa Pulido,MD 6:55 PM

## 2016-06-15 NOTE — Progress Notes (Signed)
[facility]  Patient Status: Active (receiving services up to time of hospitalization)  [facility] is providing the following services: RN and MSW  If patient discharges after hours, please call [phone].   Hunter [name] 06/15/2016, 1:34 PM

## 2016-06-15 NOTE — Progress Notes (Signed)
Subjective:  UOP 3200 since admit- potassium down,  creatinine stable  Objective Vital signs in last 24 hours: Filed Vitals:   06/14/16 2046 06/14/16 2347 06/15/16 0300 06/15/16 0804  BP: 104/61 103/45 94/45   Pulse: 85 81 80   Temp: 97.4 F (36.3 C) 97.7 F (36.5 C) 97.8 F (36.6 C)   TempSrc: Oral Oral Oral   Resp: 18 20 20    Height: 6' (1.829 m)     Weight: 82.7 kg (182 lb 5.1 oz)   79.153 kg (174 lb 8 oz)  SpO2: 98% 98% 95%    Weight change:   Intake/Output Summary (Last 24 hours) at 06/15/16 1003 Last data filed at 06/15/16 0851  Gross per 24 hour  Intake    360 ml  Output   3200 ml  Net  -2840 ml    Assessment/ Plan: Pt is [name] 81 y.o. yo male with known anemia and EF of 45%, cardiac cirrhosis who was admitted on 06/14/2016 with  [name] on CKD in the setting of volume overload and hyperkalemia  Assessment/Plan: 1. Renal - Baseline creatinine 2.1-2.3 now worsening to the 3's also with volume overload and hyperkalemia also with starting acyclovir for shingles (has been stopped)- so far has responded to medical management (lasix, veltassa, foley placement)- plan to continue some scheduled IV lasix.  Renal ultrasound pending  2. HTN/volume- overloaded- so far responding to IV lasix - will continue at [name] lower dose and also continue his home dosing of aldactone  3. Anemia- chronic issue but also could have element of CKD related anemia- give aranesp and check iron stores  4. Hyponatremia- due to cirrhosis/CHF and volume overload- IV lasix - trending better  5. Hyperkalemia- has responded to medical therapy- have stopped potassium supps- continue aldactone with veltassa for right now  Hunter [name]    Labs: Basic Metabolic Panel:  Recent Labs Lab 06/14/16 1640 06/14/16 1741 06/15/16 0714  NA 123* 124* 125*  K 5.8* 5.9* 5.1  CL 93* 94* 95*  CO2 20* 20* 22  GLUCOSE 122* 117* 88  BUN 76* 76* 81*  CREATININE 3.26* 3.34* 3.44*  CALCIUM 8.6* 8.6* 9.0  PHOS  --   --  5.8*    Liver Function Tests:  Recent Labs Lab 06/14/16 1741 06/15/16 0714  AST 41 35  ALT 23 22  ALKPHOS 165* 154*  BILITOT 2.9* 2.7*  PROT 5.3* 5.1*  ALBUMIN 2.8* 2.5*   No results for input(s): LIPASE, AMYLASE in the last 168 hours.  Recent Labs Lab 06/14/16 1713  AMMONIA 118*   CBC:  Recent Labs Lab 06/14/16 1640 06/15/16 0714  WBC 5.7 4.7  NEUTROABS 4.5  --   HGB 8.0* 7.6*  HCT 25.0* 23.3*  MCV 88.0 87.3  PLT 72* 59*   Cardiac Enzymes:  Recent Labs Lab 06/14/16 1943 06/15/16 0136 06/15/16 0714  TROPONINI 0.08* 0.07* 0.08*   CBG: No results for input(s): GLUCAP in the last 168 hours.  Iron Studies: No results for input(s): IRON, TIBC, TRANSFERRIN, FERRITIN in the last 72 hours. Studies/Results: Dg Chest 2 View  06/14/2016  CLINICAL DATA:  Increasing shortness of breath and swelling since this morning, CHF, cirrhosis, atrial fibrillation, pulmonary hypertension, history melanoma EXAM: CHEST  2 VIEW COMPARISON:  05/01/2016 FINDINGS: LEFT subclavian transvenous pacemaker lead projects over RIGHT ventricle. Enlargement of cardiac silhouette with pulmonary vascular congestion. Atherosclerotic calcification aorta. Perihilar infiltrates greater on LEFT question asymmetric edema versus infection. Subsegmental atelectasis LEFT base. No pleural effusion or pneumothorax  Bones demineralized. IMPRESSION: Enlargement of cardiac silhouette with pulmonary vascular congestion. Accentuated perihilar markings particularly on LEFT question asymmetric edema versus infection. Aortic atherosclerosis and pacemaker noted. Electronically Signed   By: Lavonia Dana M.D.   On: 06/14/2016 17:57   Medications: Infusions:    Scheduled Medications: . cefTRIAXone (ROCEPHIN)  IV  1 g Intravenous Q24H  . [START ON 06/16/2016] digoxin  0.125 mg Oral Q M,W,F  . ferrous sulfate  325 mg Oral BID WC  . lactulose  30 g Oral BID  . pantoprazole  40 mg Oral Daily  . patiromer  8.4 g Oral Daily  .  rifaximin  550 mg Oral BID  . sodium chloride flush  3 mL Intravenous Q12H  . spironolactone  50 mg Oral Daily   And  . spironolactone  25 mg Oral QHS    have reviewed scheduled and prn medications.  Physical Exam: General: shaky, slow to speak- wife at bedside says he is independent with ADLS at baseline but only walks [name] little around the house Heart: RRR Lungs: mostly clear Abdomen: slightly distended Extremities: minimal edema     06/15/2016,10:03 AM  LOS: 1 day

## 2016-06-15 NOTE — Progress Notes (Signed)
Patient ID: Hunter [name], male   DOB: 1935-08-14, 81 y.o.   MRN: [id number]    PROGRESS NOTE    Hunter [name]  [name] DOB: October 16, 1935 DOA: 06/14/2016  PCP: Penni Homans, MD   Brief Narrative:  81 y.o. male with known systolic and diastolic HF Echo Q000111Q EF 45-50% NYHA class III, pulmonary hypertension, liver failure, CKD stage III, and hereditary hemorrhagic telangiectasia with hepatic AVMs and associated anemia requiring transfusion, now presented to [facility] ED with main concern of several days duration of progressively worsening dyspnea with exertion and at rest. Admission labs notable for Troponin elevated but flat (0.08 -> 0.07 -> 0.08). UA with many bacteria and Large Hgb, moderate leukocytes and protein. CXR with pulmonary vascular congestion, ? asymmetric edema versus infection.   Cardiology and nephrology teams consulted, Nephrology team, placed on Lasix 120 mg IV and weight is down 8 lbs.   Assessment & Plan:   Active Problems:   Acute on chronic combined systolic and diastolic CHF, NYHA class III - last ECHO in 2016 with EF 45% and appears unchanged based on repeat ECHO on this admission - weight is down from admission: 182 --> 174 lbs this AM - currently on lasix 80 mg IV BID - continue to monitor clinical response - appreciate nephrology and cardiology teams following     Acute on chronic kidney disease stage III - IV - with GFR in 30-40's and baseline Cr 2 - 2.2 - now up likely in the setting of volume overload - continue to treat with Lasix  - per nephrology     Hepatic encephalopathy - continue rifaximin and lactulose     Hyponatremia - likely secondary to effects of ADH in the setting of liver cirrhosis  - improving but needs close monitoring while on higher doses of lasix     Chronic thrombocytopenia  - in the setting of liver cirrhosis - diffuse petechiae noted  - CBC in AM    UTI with hematuria - started Rocephin today - follow up on urine cultures      HHT (hereditary hemorrhagic telangiectasia) ([place])    Anemia due to chronic blood loss - anemia panel pending    DVT prophylaxis: SCD's Code Status: DNR Family Communication: Patient at bedside, wife at bedside  Disposition Plan: To be determined, likely home in few days   Consultants:   Cardiology   Nephrology   Procedures:   None  Antimicrobials:   Rocephin 7/6 -->   Subjective: Reports feeling tired.   Objective: Filed Vitals:   06/14/16 2347 06/15/16 0300 06/15/16 0804 06/15/16 1144  BP: 103/45 94/45  104/44  Pulse: 81 80  81  Temp: 97.7 F (36.5 C) 97.8 F (36.6 C)  97.6 F (36.4 C)  TempSrc: Oral Oral  Oral  Resp: 20 20  20   Height:      Weight:   79.153 kg (174 lb 8 oz)   SpO2: 98% 95%  98%    Intake/Output Summary (Last 24 hours) at 06/15/16 1612 Last data filed at 06/15/16 1320  Gross per 24 hour  Intake    360 ml  Output   4050 ml  Net  -3690 ml   Filed Weights   06/14/16 2046 06/15/16 0804  Weight: 82.7 kg (182 lb 5.1 oz) 79.153 kg (174 lb 8 oz)    Examination:  General exam: Appears tired, chronically ill Respiratory system: Respiratory effort normal. Diminished at bases  Cardiovascular system: S1 & S2 heard, RRR. No JVD, rubs,  gallops or clicks. SEM 2/6 Gastrointestinal system: Abdomen is nondistended, soft and nontender. No organomegaly or masses felt.  Central nervous system: somnolent but easy to awake. Follows commands appropriately  Skin: multiple petechia's    Data Reviewed: I have personally reviewed following labs and imaging studies  CBC:  Recent Labs Lab 06/14/16 1640 06/15/16 0714  WBC 5.7 4.7  NEUTROABS 4.5  --   HGB 8.0* 7.6*  HCT 25.0* 23.3*  MCV 88.0 87.3  PLT 72* 59*   Basic Metabolic Panel:  Recent Labs Lab 06/14/16 1640 06/14/16 1741 06/15/16 0714  NA 123* 124* 125*  K 5.8* 5.9* 5.1  CL 93* 94* 95*  CO2 20* 20* 22  GLUCOSE 122* 117* 88  BUN 76* 76* 81*  CREATININE 3.26* 3.34* 3.44*    CALCIUM 8.6* 8.6* 9.0  MG  --   --  3.0*  PHOS  --   --  5.8*   Liver Function Tests:  Recent Labs Lab 06/14/16 1741 06/15/16 0714  AST 41 35  ALT 23 22  ALKPHOS 165* 154*  BILITOT 2.9* 2.7*  PROT 5.3* 5.1*  ALBUMIN 2.8* 2.5*    Recent Labs Lab 06/14/16 1713  AMMONIA 118*   Cardiac Enzymes:  Recent Labs Lab 06/14/16 1943 06/15/16 0136 06/15/16 0714  TROPONINI 0.08* 0.07* 0.08*   BNP (last 3 results)  Recent Labs  08/09/15 1621  PROBNP 188.0*   Thyroid Function Tests:  Recent Labs  06/15/16 0136  TSH 7.642*   Urine analysis:    Component Value Date/Time   COLORURINE YELLOW 06/15/2016 0721   APPEARANCEUR CLEAR 06/15/2016 0721   LABSPEC 1.011 06/15/2016 0721   PHURINE 6.5 06/15/2016 0721   GLUCOSEU NEGATIVE 06/15/2016 0721   HGBUR LARGE* 06/15/2016 0721   BILIRUBINUR NEGATIVE 06/15/2016 0721   KETONESUR NEGATIVE 06/15/2016 0721   PROTEINUR 100* 06/15/2016 0721   UROBILINOGEN 0.2 08/10/2015 1700   NITRITE NEGATIVE 06/15/2016 0721   LEUKOCYTESUR MODERATE* 06/15/2016 0721     Radiology Studies: Dg Chest 2 View  06/14/2016  CLINICAL DATA:  Increasing shortness of breath and swelling since this morning, CHF, cirrhosis, atrial fibrillation, pulmonary hypertension, history melanoma EXAM: CHEST  2 VIEW COMPARISON:  05/01/2016 FINDINGS: LEFT subclavian transvenous pacemaker lead projects over RIGHT ventricle. Enlargement of cardiac silhouette with pulmonary vascular congestion. Atherosclerotic calcification aorta. Perihilar infiltrates greater on LEFT question asymmetric edema versus infection. Subsegmental atelectasis LEFT base. No pleural effusion or pneumothorax Bones demineralized. IMPRESSION: Enlargement of cardiac silhouette with pulmonary vascular congestion. Accentuated perihilar markings particularly on LEFT question asymmetric edema versus infection. Aortic atherosclerosis and pacemaker noted. Electronically Signed   By: Lavonia Dana M.D.   On:  06/14/2016 17:57   US Renal  06/15/2016  CLINICAL DATA:  Acute renal failure EXAM: RENAL / URINARY TRACT ULTRASOUND COMPLETE COMPARISON:  None. FINDINGS: Right Kidney: Length: 12.7 cm. Mildly echogenic right renal parenchyma. No right hydronephrosis. Questionable hyperechoic 0.8 x 0.6 x 0.9 cm renal cortical lesion in the upper right kidney (possibly artifactual due to junctional defect). Several tiny simple appearing renal cortical cysts in the right kidney, largest 1.2 x 1.0 x 1.0 cm in the interpolar right kidney. Left Kidney: Length: 12.2 cm. Mildly echogenic left renal parenchyma. No left hydronephrosis. Simple appearing small renal cortical cysts in the left kidney, largest 1.6 x 1.3 x 0.7 cm in the lateral left kidney. Bladder: Urinary bladder is collapsed by indwelling Foley catheter and cannot be evaluated on this scan. IMPRESSION: 1. No hydronephrosis. 2. Mildly echogenic  kidneys, indicating nonspecific renal parenchymal disease of uncertain chronicity. 3. Questionable hyperechoic 0.9 cm renal cortical lesion in the upper right kidney, neoplasm not excluded. Recommend further evaluation with renal mass protocol MRI or CT abdomen without and with IV contrast on a short term outpatient basis. 4. Urinary bladder collapsed by indwelling Foley catheter and not evaluated on this scan. Electronically Signed   By: Ilona Sorrel M.D.   On: 06/15/2016 12:00      Scheduled Meds: . cefTRIAXone (ROCEPHIN)  IV  1 g Intravenous Q24H  . darbepoetin (ARANESP) injection - NON-DIALYSIS  100 mcg Subcutaneous Q Thu-1800  . [START ON 06/16/2016] digoxin  0.125 mg Oral Q M,W,F  . ferrous sulfate  325 mg Oral BID WC  . furosemide  80 mg Intravenous Q12H  . lactulose  30 g Oral BID  . pantoprazole  40 mg Oral Daily  . patiromer  8.4 g Oral Daily  . rifaximin  550 mg Oral BID  . sodium chloride flush  3 mL Intravenous Q12H  . spironolactone  50 mg Oral Daily   And  . spironolactone  25 mg Oral QHS   Continuous  Infusions:    LOS: 1 day    Time spent: 20 minutes    Faye Ramsay, MD [facility] Pager [phone]  If 7PM-7AM, please contact night-coverage [email or web address] Password [email or web address] 06/15/2016, 4:12 PM

## 2016-06-16 ENCOUNTER — Other Ambulatory Visit (HOSPITAL_COMMUNITY): Payer: Self-pay | Admitting: Internal Medicine

## 2016-06-16 LAB — CBC
HEMATOCRIT: 23.1 % — AB (ref 39.0–52.0)
Hemoglobin: 7.6 g/dL — ABNORMAL LOW (ref 13.0–17.0)
MCH: 28.8 pg (ref 26.0–34.0)
MCHC: 32.9 g/dL (ref 30.0–36.0)
MCV: 87.5 fL (ref 78.0–100.0)
Platelets: 55 10*3/uL — ABNORMAL LOW (ref 150–400)
RBC: 2.64 MIL/uL — ABNORMAL LOW (ref 4.22–5.81)
RDW: 18.8 % — AB (ref 11.5–15.5)
WBC: 4.4 10*3/uL (ref 4.0–10.5)

## 2016-06-16 LAB — RENAL FUNCTION PANEL
Albumin: 2.6 g/dL — ABNORMAL LOW (ref 3.5–5.0)
Anion gap: 10 (ref 5–15)
BUN: 85 mg/dL — ABNORMAL HIGH (ref 6–20)
CALCIUM: 8.9 mg/dL (ref 8.9–10.3)
CHLORIDE: 93 mmol/L — AB (ref 101–111)
CO2: 25 mmol/L (ref 22–32)
CREATININE: 3.18 mg/dL — AB (ref 0.61–1.24)
GFR, EST AFRICAN AMERICAN: 20 mL/min — AB (ref >60)
GFR, EST NON AFRICAN AMERICAN: 17 mL/min — AB (ref >60)
Glucose, Bld: 90 mg/dL (ref 65–99)
Phosphorus: 5.5 mg/dL — ABNORMAL HIGH (ref 2.5–4.6)
Potassium: 3.9 mmol/L (ref 3.5–5.1)
SODIUM: 128 mmol/L — AB (ref 135–145)

## 2016-06-16 LAB — URINE CULTURE: Culture: NO GROWTH

## 2016-06-16 LAB — IRON AND TIBC
IRON: 30 ug/dL — AB (ref 45–182)
Saturation Ratios: 6 % — ABNORMAL LOW (ref 17.9–39.5)
TIBC: 465 ug/dL — AB (ref 250–450)
UIBC: 435 ug/dL

## 2016-06-16 LAB — FERRITIN: Ferritin: 35 ng/mL (ref 24–336)

## 2016-06-16 MED ORDER — ENSURE ENLIVE PO LIQD
237.0000 mL | Freq: Two times a day (BID) | ORAL | Status: DC
  Administered 2016-06-16 – 2016-06-28 (×21): 237 mL via ORAL

## 2016-06-16 MED ORDER — SODIUM CHLORIDE 0.9 % IV SOLN
510.0000 mg | Freq: Once | INTRAVENOUS | Status: AC
  Administered 2016-06-16: 510 mg via INTRAVENOUS
  Filled 2016-06-16: qty 17

## 2016-06-16 MED ORDER — FERROUS SULFATE 325 (65 FE) MG PO TABS
325.0000 mg | ORAL_TABLET | Freq: Two times a day (BID) | ORAL | Status: DC
  Administered 2016-06-17 – 2016-06-28 (×18): 325 mg via ORAL
  Filled 2016-06-16 (×22): qty 1

## 2016-06-16 NOTE — Progress Notes (Signed)
Orders received for rectal tube/flexi seal placement r/t Stage 3 sacral pressure ulcers and consistent loose BMs.  Changed bed to air mattress r/t Stage 3 sacral pressure ulcers.

## 2016-06-16 NOTE — Care Management Note (Signed)
Case Management Note  Patient Details  Name: Hunter [name] MRN: [id number] Date of Birth: 03-23-1935  Subjective/Objective:     Admitted with Anemia               Action/Plan: Patient lives at home with spouse, spouse stated that he has a walker, 3:1 and wheelchair at home; he is active with [facility] for HHRN/ PT/ OT/SW; PCP is Dr Verdie Mosher; private insurance with Medicare, he does not have a part D supplement for prescription drug coverage; spouse stated that they plan to sign up for a program during open enrollment time; Patient pays for his medication out of pocket at [facility]. Spouse stated that she does not want a hospital bed for the patient at this time but wanted an air mattress; [place], they do no have air mattress for regular beds and referred patient to [facility];   Expected Discharge Date:   possibly 06/21/2016               Expected Discharge Plan:  [place]   Choice offered to:  Spouse, Patient  HH Arranged:  RN, PT, OT, Social Work [employer] Agency:  [place]  Status of Service:  In process, will continue to follow  Royston Bake [place] ,[place] [phone]  06/16/2016, 11:44 AM

## 2016-06-16 NOTE — Progress Notes (Signed)
Advanced Heart Failure Rounding Note  Referring Provider: Dr Doyle Askew Primary Cardiologist: Dr. Claude Manges, in [place], had also been acting as PCP Primary HF: Dr. Haroldine Laws  PCP: Randel Pigg  Reason for Consultation: A/C combined CHF in setting of AKI.   Subjective:    Much more alert this morning. Sitting up for breakfast.  Denies SOB, CP, lightheadedness, or dizziness. Just feels "bad" overall with no specific complaint.   Had 15 stools yesterday.   Out 3 L and down another 3 lbs on 80 mg IV lasix BID. Creatinine 3.4 -> 3.18. K 3.9  Objective:   Weight Range: 171 lb 3.2 oz (77.656 kg) Body mass index is 23.21 kg/(m^2).   Vital Signs:   Temp:  [97.6 F (36.4 C)-98.2 F (36.8 C)] 97.6 F (36.4 C) (07/07 0904) Pulse Rate:  [65-81] 81 (07/07 0907) Resp:  [14-20] 16 (07/07 0904) BP: (86-115)/(39-54) 86/39 mmHg (07/07 0907) SpO2:  [97 %-100 %] 100 % (07/07 0904) Weight:  [171 lb 3.2 oz (77.656 kg)] 171 lb 3.2 oz (77.656 kg) (07/07 0623) Last BM Date: 06/15/16 (pt takes lactulose chronically)  Weight change: Filed Weights   06/14/16 2046 06/15/16 0804 06/16/16 0623  Weight: 182 lb 5.1 oz (82.7 kg) 174 lb 8 oz (79.153 kg) 171 lb 3.2 oz (77.656 kg)    Intake/Output:   Intake/Output Summary (Last 24 hours) at 06/16/16 0915 Last data filed at 06/16/16 [id number]  Gross per 24 hour  Intake    340 ml  Output   3075 ml  Net  -2735 ml     Physical Exam: General: Elderly and ill appearing, NAD, Sitting up in bed.  HEENT: Diffuse telengectasias, otherwise normal Neck: supple. JVP 8-9 Carotids 2+ bilat; no bruits. No thyromegaly or nodule noted Cor: PMI nondisplaced. RRR. II/VI SEM at LUSB and apex Lungs: Diminished basilar L>R Abdomen: soft, non-tender, non-distended, no HSM. No bruits or masses. +BS  Extremities: no cyanosis, clubbing, rash. Trace LE edema, + mild asterixis Neuro: alert & orientedx3, cranial nerves grossly intact. moves all 4 extremities w/o difficulty.  Affect flat.  GU: Foley in place with pink urine in bag, and darker urine in line.   Telemetry: Reviewed, V pacing in 80s  Labs: CBC  Recent Labs  06/14/16 1640 06/15/16 0714 06/16/16 0408  WBC 5.7 4.7 4.4  NEUTROABS 4.5  --   --   HGB 8.0* 7.6* 7.6*  HCT 25.0* 23.3* 23.1*  MCV 88.0 87.3 87.5  PLT 72* 59* 55*   Basic Metabolic Panel  Recent Labs  06/15/16 0714 06/16/16 0408  NA 125* 128*  K 5.1 3.9  CL 95* 93*  CO2 22 25  GLUCOSE 88 90  BUN 81* 85*  CREATININE 3.44* 3.18*  CALCIUM 9.0 8.9  MG 3.0*  --   PHOS 5.8* 5.5*   Liver Function Tests  Recent Labs  06/14/16 1741 06/15/16 0714 06/16/16 0408  AST 41 35  --   ALT 23 22  --   ALKPHOS 165* 154*  --   BILITOT 2.9* 2.7*  --   PROT 5.3* 5.1*  --   ALBUMIN 2.8* 2.5* 2.6*   No results for input(s): LIPASE, AMYLASE in the last 72 hours. Cardiac Enzymes  Recent Labs  06/14/16 1943 06/15/16 0136 06/15/16 0714  TROPONINI 0.08* 0.07* 0.08*    BNP: BNP (last 3 results)  Recent Labs  09/02/15 1239 06/14/16 1640  BNP 280.5* 340.4*    ProBNP (last 3 results)  Recent Labs  08/09/15  1621  PROBNP 188.0*     D-Dimer No results for input(s): DDIMER in the last 72 hours. Hemoglobin A1C No results for input(s): HGBA1C in the last 72 hours. Fasting Lipid Panel No results for input(s): CHOL, HDL, LDLCALC, TRIG, CHOLHDL, LDLDIRECT in the last 72 hours. Thyroid Function Tests  Recent Labs  06/15/16 0136  TSH 7.642*    Other results:     Imaging/Studies:  Dg Chest 2 View  06/14/2016  CLINICAL DATA:  Increasing shortness of breath and swelling since this morning, CHF, cirrhosis, atrial fibrillation, pulmonary hypertension, history melanoma EXAM: CHEST  2 VIEW COMPARISON:  05/01/2016 FINDINGS: LEFT subclavian transvenous pacemaker lead projects over RIGHT ventricle. Enlargement of cardiac silhouette with pulmonary vascular congestion. Atherosclerotic calcification aorta. Perihilar  infiltrates greater on LEFT question asymmetric edema versus infection. Subsegmental atelectasis LEFT base. No pleural effusion or pneumothorax Bones demineralized. IMPRESSION: Enlargement of cardiac silhouette with pulmonary vascular congestion. Accentuated perihilar markings particularly on LEFT question asymmetric edema versus infection. Aortic atherosclerosis and pacemaker noted. Electronically Signed   By: Lavonia Dana M.D.   On: 06/14/2016 17:57   US Renal  06/15/2016  CLINICAL DATA:  Acute renal failure EXAM: RENAL / URINARY TRACT ULTRASOUND COMPLETE COMPARISON:  None. FINDINGS: Right Kidney: Length: 12.7 cm. Mildly echogenic right renal parenchyma. No right hydronephrosis. Questionable hyperechoic 0.8 x 0.6 x 0.9 cm renal cortical lesion in the upper right kidney (possibly artifactual due to junctional defect). Several tiny simple appearing renal cortical cysts in the right kidney, largest 1.2 x 1.0 x 1.0 cm in the interpolar right kidney. Left Kidney: Length: 12.2 cm. Mildly echogenic left renal parenchyma. No left hydronephrosis. Simple appearing small renal cortical cysts in the left kidney, largest 1.6 x 1.3 x 0.7 cm in the lateral left kidney. Bladder: Urinary bladder is collapsed by indwelling Foley catheter and cannot be evaluated on this scan. IMPRESSION: 1. No hydronephrosis. 2. Mildly echogenic kidneys, indicating nonspecific renal parenchymal disease of uncertain chronicity. 3. Questionable hyperechoic 0.9 cm renal cortical lesion in the upper right kidney, neoplasm not excluded. Recommend further evaluation with renal mass protocol MRI or CT abdomen without and with IV contrast on a short term outpatient basis. 4. Urinary bladder collapsed by indwelling Foley catheter and not evaluated on this scan. Electronically Signed   By: Ilona Sorrel M.D.   On: 06/15/2016 12:00     Latest Echo  Latest Cath   Medications:     Scheduled Medications: . cefTRIAXone (ROCEPHIN)  IV  1 g Intravenous  Q24H  . darbepoetin (ARANESP) injection - NON-DIALYSIS  100 mcg Subcutaneous Q Thu-1800  . digoxin  0.125 mg Oral Q M,W,F  . ferrous sulfate  325 mg Oral BID WC  . furosemide  80 mg Intravenous Q12H  . lactulose  30 g Oral BID  . pantoprazole  40 mg Oral Daily  . patiromer  8.4 g Oral Daily  . rifaximin  550 mg Oral BID  . sodium chloride flush  3 mL Intravenous Q12H  . spironolactone  50 mg Oral Daily   And  . spironolactone  25 mg Oral QHS     Infusions:     PRN Medications:  acetaminophen **OR** acetaminophen, Gerhardt's butt cream, HYDROcodone-acetaminophen, ondansetron **OR** ondansetron (ZOFRAN) IV   Assessment   1. Acute on chronic diastolic CHF Echo Q000111Q 45-50%.  2. ARF on CKD Stage III  3. Liver failure / cirrhosis 4. Pulmonary hypertension on Echo 08/11/15 PA peak pressure 51 mm Hg 5. Acute on  Chronic Anemia 6. Hereditary telangiectasia. 7. Hepatic encephalopathy 8. Hyperkalemia  Plan    Continues to respond well to 80 mg IV lasix BID per renal dosing.  Creatinine slightly improved as well.  Would likely continue for at least one more day with at least mild volume still on board.   Hgb remains low, but stable at 7.6. Transfusion per primary.   On ceftriaxone for UTI.   Pt prognosis guarded.   Length of Stay: 2  Shirley Friar PA-C 06/16/2016, 9:15 AM  Advanced Heart Failure Team Pager [phone] (M-F; 7a - 4p)  Please contact [facility] for night-coverage after hours (4p -7a ) and weekends on [email or web address]   Diuresing well. With Iv lasix. Renal managing diuretics. We will sign off. He can f/u with HF Clinic after discharge.   Bensimhon, Daniel,MD 4:06 PM

## 2016-06-16 NOTE — Progress Notes (Signed)
Admit: 06/14/2016 LOS: 2  41M with cirrhosis, sCHF, admit with AoCKD + hypervolemia + hyperkalemia  Subjective:  3L UOP yesterday; lasix is 80 IV BID Weight down 11lb On RA oxygen, SpO2>97% SCr stable, K 3.9 Is fe deficient No new events   07/06 0701 - 07/07 0700 In: 460 [P.O.:460] Out: 3075 [Urine:3075]  Filed Weights   06/14/16 2046 06/15/16 0804 06/16/16 0623  Weight: 82.7 kg (182 lb 5.1 oz) 79.153 kg (174 lb 8 oz) 77.656 kg (171 lb 3.2 oz)    Scheduled Meds: . cefTRIAXone (ROCEPHIN)  IV  1 g Intravenous Q24H  . darbepoetin (ARANESP) injection - NON-DIALYSIS  100 mcg Subcutaneous Q Thu-1800  . digoxin  0.125 mg Oral Q M,W,F  . ferrous sulfate  325 mg Oral BID WC  . furosemide  80 mg Intravenous Q12H  . lactulose  30 g Oral BID  . pantoprazole  40 mg Oral Daily  . patiromer  8.4 g Oral Daily  . rifaximin  550 mg Oral BID  . sodium chloride flush  3 mL Intravenous Q12H  . spironolactone  50 mg Oral Daily   And  . spironolactone  25 mg Oral QHS   Continuous Infusions:  PRN Meds:.acetaminophen **OR** acetaminophen, Gerhardt's butt cream, HYDROcodone-acetaminophen, ondansetron **OR** ondansetron (ZOFRAN) IV  Current Labs: reviewed 7/7: TSAT 6%; Ferritin 35, Hb 7.6 MCV 87     Physical Exam:  Blood pressure 86/39, pulse 81, temperature 97.6 F (36.4 C), temperature source Oral, resp. rate 16, height 6' (1.829 m), weight 77.656 kg (171 lb 3.2 oz), SpO2 100 %. NAD, awake, slow in response RRR nl s1s2 Foley in place Distended, soft Nonfocal  A 1. AoCKD3 1. BL SCr 2.1-2.3 2. sCHF with Hypervolemia 3. Cirrhosis 4. HHT 5. Recurrent GIB 6. Chronic Anemia with Fe deficiency 7. Hyponatremia, hypervolemic, improving 8. Hyperkalemia, improving  P 1.  Cont diuretics IV for another 24h, anticipate transition to PO (home is torsemide) tomorrow 2. On PO Fe but still with IDA, give IV Fe -- ferraheme 510mg  IV x1   Pearson Grippe MD 06/16/2016, 9:19 AM   Recent  Labs Lab 06/14/16 1741 06/15/16 0714 06/16/16 0408  NA 124* 125* 128*  K 5.9* 5.1 3.9  CL 94* 95* 93*  CO2 20* 22 25  GLUCOSE 117* 88 90  BUN 76* 81* 85*  CREATININE 3.34* 3.44* 3.18*  CALCIUM 8.6* 9.0 8.9  PHOS  --  5.8* 5.5*    Recent Labs Lab 06/14/16 1640 06/15/16 0714 06/16/16 0408  WBC 5.7 4.7 4.4  NEUTROABS 4.5  --   --   HGB 8.0* 7.6* 7.6*  HCT 25.0* 23.3* 23.1*  MCV 88.0 87.3 87.5  PLT 72* 59* 55*

## 2016-06-16 NOTE — Progress Notes (Signed)
Patient ID: Hunter [name], male   DOB: 1935-08-02, 82 y.o.   MRN: [id number]    PROGRESS NOTE    Hunter [name]  [name] DOB: 1935-01-15 DOA: 06/14/2016  PCP: Hunter Homans, MD   Brief Narrative:  81 y.o. male with known systolic and diastolic HF Echo [id number] EF 45-50% NYHA class III, pulmonary hypertension, liver failure, CKD stage III, and hereditary hemorrhagic telangiectasia with hepatic AVMs and associated anemia requiring transfusion, now presented to [facility] ED with main concern of several days duration of progressively worsening dyspnea with exertion and at rest. Admission labs notable for Troponin elevated but flat (0.08 -> 0.07 -> 0.08). UA with many bacteria and Large Hgb, moderate leukocytes and protein. CXR with pulmonary vascular congestion, ? asymmetric edema versus infection.   Cardiology and nephrology teams consulted, Nephrology team, placed on Lasix 120 mg IV and weight is down 8 lbs.   Assessment & Plan:   Active Problems:   Acute on chronic combined systolic and diastolic CHF, NYHA class III - last ECHO in 2016 with EF 45% and appears unchanged based on repeat ECHO on this admission - weight is down from admission: 182 --> 174 --> 171 lbs this AM - currently on lasix 80 mg IV BID, possible transition to PO in AM - continue to monitor clinical response - appreciate nephrology and cardiology teams following     Acute on chronic kidney disease stage III - IV - with GFR in 30-40's and baseline Cr 2 - 2.2 - now up likely in the setting of volume overload - Cr is trending down from 3.4 --> 3.1  - continue to treat with Lasix  - per nephrology     Hepatic encephalopathy - continue rifaximin and lactulose  - still rather somnolent     Hyponatremia - likely secondary to effects of ADH in the setting of liver cirrhosis  - improving but needs close monitoring while on higher doses of lasix     Chronic thrombocytopenia  - in the setting of liver cirrhosis - diffuse  petechiae noted, overall stable  - CBC in AM    UTI with hematuria - started Rocephin, continue day #2 - follow up on urine cultures     HHT (hereditary hemorrhagic telangiectasia) ([place])    Anemia due to chronic blood loss - Fe def - per nephrology team   DVT prophylaxis: SCD's Code Status: DNR Family Communication: Patient at bedside, wife at bedside  Disposition Plan: To be determined, likely home in few days   Consultants:   Cardiology   Nephrology   Procedures:   None  Antimicrobials:   Rocephin 7/6 -->   Subjective: Reports feeling tired.   Objective: Filed Vitals:   06/16/16 0907 06/16/16 0947 06/16/16 0949 06/16/16 1333  BP: 86/39 95/41 93/46  116/52  Pulse: 81   82  Temp:      TempSrc:      Resp:    16  Height:      Weight:      SpO2:    98%    Intake/Output Summary (Last 24 hours) at 06/16/16 1652 Last data filed at 06/16/16 1417  Gross per 24 hour  Intake    937 ml  Output   3225 ml  Net  -2288 ml   Filed Weights   06/14/16 2046 06/15/16 0804 06/16/16 0623  Weight: 82.7 kg (182 lb 5.1 oz) 79.153 kg (174 lb 8 oz) 77.656 kg (171 lb 3.2 oz)    Examination:  General exam:  Appears tired, chronically ill Respiratory system: Respiratory effort normal. Diminished at bases  Cardiovascular system: S1 & S2 heard, RRR. No JVD, rubs, gallops or clicks. SEM 2/6 Gastrointestinal system: Abdomen is nondistended, soft and nontender. No organomegaly or masses felt.  Central nervous system: somnolent but easy to awake. Follows commands appropriately  Skin: multiple petechia's    Data Reviewed: I have personally reviewed following labs and imaging studies  CBC:  Recent Labs Lab 06/14/16 1640 06/15/16 0714 06/16/16 0408  WBC 5.7 4.7 4.4  NEUTROABS 4.5  --   --   HGB 8.0* 7.6* 7.6*  HCT 25.0* 23.3* 23.1*  MCV 88.0 87.3 87.5  PLT 72* 59* 55*   Basic Metabolic Panel:  Recent Labs Lab 06/14/16 1640 06/14/16 1741 06/15/16 0714 06/16/16 0408    NA 123* 124* 125* 128*  K 5.8* 5.9* 5.1 3.9  CL 93* 94* 95* 93*  CO2 20* 20* 22 25  GLUCOSE 122* 117* 88 90  BUN 76* 76* 81* 85*  CREATININE 3.26* 3.34* 3.44* 3.18*  CALCIUM 8.6* 8.6* 9.0 8.9  MG  --   --  3.0*  --   PHOS  --   --  5.8* 5.5*   Liver Function Tests:  Recent Labs Lab 06/14/16 1741 06/15/16 0714 06/16/16 0408  AST 41 35  --   ALT 23 22  --   ALKPHOS 165* 154*  --   BILITOT 2.9* 2.7*  --   PROT 5.3* 5.1*  --   ALBUMIN 2.8* 2.5* 2.6*    Recent Labs Lab 06/14/16 1713  AMMONIA 118*   Cardiac Enzymes:  Recent Labs Lab 06/14/16 1943 06/15/16 0136 06/15/16 0714  TROPONINI 0.08* 0.07* 0.08*   BNP (last 3 results)  Recent Labs  08/09/15 1621  PROBNP 188.0*   Thyroid Function Tests:  Recent Labs  06/15/16 0136  TSH 7.642*   Urine analysis:    Component Value Date/Time   COLORURINE YELLOW 06/15/2016 0721   APPEARANCEUR CLEAR 06/15/2016 0721   LABSPEC 1.011 06/15/2016 0721   PHURINE 6.5 06/15/2016 0721   GLUCOSEU NEGATIVE 06/15/2016 0721   HGBUR LARGE* 06/15/2016 0721   BILIRUBINUR NEGATIVE 06/15/2016 0721   KETONESUR NEGATIVE 06/15/2016 0721   PROTEINUR 100* 06/15/2016 0721   UROBILINOGEN 0.2 08/10/2015 1700   NITRITE NEGATIVE 06/15/2016 0721   LEUKOCYTESUR MODERATE* 06/15/2016 0721     Radiology Studies: Dg Chest 2 View  06/14/2016  CLINICAL DATA:  Increasing shortness of breath and swelling since this morning, CHF, cirrhosis, atrial fibrillation, pulmonary hypertension, history melanoma EXAM: CHEST  2 VIEW COMPARISON:  05/01/2016 FINDINGS: LEFT subclavian transvenous pacemaker lead projects over RIGHT ventricle. Enlargement of cardiac silhouette with pulmonary vascular congestion. Atherosclerotic calcification aorta. Perihilar infiltrates greater on LEFT question asymmetric edema versus infection. Subsegmental atelectasis LEFT base. No pleural effusion or pneumothorax Bones demineralized. IMPRESSION: Enlargement of cardiac silhouette  with pulmonary vascular congestion. Accentuated perihilar markings particularly on LEFT question asymmetric edema versus infection. Aortic atherosclerosis and pacemaker noted. Electronically Signed   By: Lavonia Dana M.D.   On: 06/14/2016 17:57   US Renal  06/15/2016  CLINICAL DATA:  Acute renal failure EXAM: RENAL / URINARY TRACT ULTRASOUND COMPLETE COMPARISON:  None. FINDINGS: Right Kidney: Length: 12.7 cm. Mildly echogenic right renal parenchyma. No right hydronephrosis. Questionable hyperechoic 0.8 x 0.6 x 0.9 cm renal cortical lesion in the upper right kidney (possibly artifactual due to junctional defect). Several tiny simple appearing renal cortical cysts in the right kidney, largest 1.2 x 1.0 x  1.0 cm in the interpolar right kidney. Left Kidney: Length: 12.2 cm. Mildly echogenic left renal parenchyma. No left hydronephrosis. Simple appearing small renal cortical cysts in the left kidney, largest 1.6 x 1.3 x 0.7 cm in the lateral left kidney. Bladder: Urinary bladder is collapsed by indwelling Foley catheter and cannot be evaluated on this scan. IMPRESSION: 1. No hydronephrosis. 2. Mildly echogenic kidneys, indicating nonspecific renal parenchymal disease of uncertain chronicity. 3. Questionable hyperechoic 0.9 cm renal cortical lesion in the upper right kidney, neoplasm not excluded. Recommend further evaluation with renal mass protocol MRI or CT abdomen without and with IV contrast on a short term outpatient basis. 4. Urinary bladder collapsed by indwelling Foley catheter and not evaluated on this scan. Electronically Signed   By: Ilona Sorrel M.D.   On: 06/15/2016 12:00      Scheduled Meds: . cefTRIAXone (ROCEPHIN)  IV  1 g Intravenous Q24H  . darbepoetin (ARANESP) injection - NON-DIALYSIS  100 mcg Subcutaneous Q Thu-1800  . digoxin  0.125 mg Oral Q M,W,F  . feeding supplement (ENSURE ENLIVE)  237 mL Oral BID BM  . ferrous sulfate  325 mg Oral BID WC  . furosemide  80 mg Intravenous Q12H  .  lactulose  30 g Oral BID  . pantoprazole  40 mg Oral Daily  . patiromer  8.4 g Oral Daily  . rifaximin  550 mg Oral BID  . sodium chloride flush  3 mL Intravenous Q12H  . spironolactone  50 mg Oral Daily   And  . spironolactone  25 mg Oral QHS   Continuous Infusions:    LOS: 2 days    Time spent: 20 minutes    Faye Ramsay, MD [facility] Pager [phone]  If 7PM-7AM, please contact night-coverage [email or web address] Password [facility] 06/16/2016, 4:52 PM

## 2016-06-17 DIAGNOSIS — N189 Chronic kidney disease, unspecified: Secondary | ICD-10-CM

## 2016-06-17 DIAGNOSIS — N179 Acute kidney failure, unspecified: Secondary | ICD-10-CM

## 2016-06-17 LAB — BASIC METABOLIC PANEL
Anion gap: 12 (ref 5–15)
BUN: 84 mg/dL — AB (ref 6–20)
CO2: 23 mmol/L (ref 22–32)
Calcium: 8.7 mg/dL — ABNORMAL LOW (ref 8.9–10.3)
Chloride: 91 mmol/L — ABNORMAL LOW (ref 101–111)
Creatinine, Ser: 2.83 mg/dL — ABNORMAL HIGH (ref 0.61–1.24)
GFR calc Af Amer: 23 mL/min — ABNORMAL LOW (ref >60)
GFR, EST NON AFRICAN AMERICAN: 20 mL/min — AB (ref >60)
GLUCOSE: 96 mg/dL (ref 65–99)
POTASSIUM: 3.7 mmol/L (ref 3.5–5.1)
Sodium: 126 mmol/L — ABNORMAL LOW (ref 135–145)

## 2016-06-17 LAB — CBC
HCT: 22.9 % — ABNORMAL LOW (ref 39.0–52.0)
Hemoglobin: 7.4 g/dL — ABNORMAL LOW (ref 13.0–17.0)
MCH: 28.4 pg (ref 26.0–34.0)
MCHC: 32.3 g/dL (ref 30.0–36.0)
MCV: 87.7 fL (ref 78.0–100.0)
PLATELETS: 52 10*3/uL — AB (ref 150–400)
RBC: 2.61 MIL/uL — ABNORMAL LOW (ref 4.22–5.81)
RDW: 18.7 % — AB (ref 11.5–15.5)
WBC: 4.8 10*3/uL (ref 4.0–10.5)

## 2016-06-17 MED ORDER — TORSEMIDE 20 MG PO TABS
100.0000 mg | ORAL_TABLET | Freq: Every day | ORAL | Status: DC
  Administered 2016-06-17 – 2016-06-28 (×10): 100 mg via ORAL
  Filled 2016-06-17 (×10): qty 5

## 2016-06-17 NOTE — Progress Notes (Signed)
Patient ID: Hunter [name], male   DOB: 1935-09-15, 81 y.o.   MRN: [id number]    PROGRESS NOTE    Hunter [name]  [name] DOB: Dec 09, 1935 DOA: 06/14/2016  PCP: Penni Homans, MD   Brief Narrative:  81 y.o. male with known systolic and diastolic HF Echo Q000111Q EF 45-50% NYHA class III, pulmonary hypertension, liver failure, CKD stage III, and hereditary hemorrhagic telangiectasia with hepatic AVMs and associated anemia requiring transfusion, now presented to [facility] ED with main concern of several days duration of progressively worsening dyspnea with exertion and at rest. Admission labs notable for Troponin elevated but flat (0.08 -> 0.07 -> 0.08). UA with many bacteria and Large Hgb, moderate leukocytes and protein. CXR with pulmonary vascular congestion, ? asymmetric edema versus infection.   Cardiology and nephrology teams consulted, Nephrology team, placed on Lasix 120 mg IV and weight is down 8 lbs.   Assessment & Plan:   Active Problems:   Acute on chronic combined systolic and diastolic CHF, NYHA class III - last ECHO in 2016 with EF 45% and appears unchanged based on repeat ECHO on this admission - weight is down from admission: 182 --> 174 --> 171 --> 175 lbs this AM - was on lasix 80 mg IV BID, changed to Torsemide 100 mg PO QD today  - continue to monitor clinical response - appreciate nephrology and cardiology teams following  - cardiology team signed off as there are no further recommendations     Acute on chronic kidney disease stage III - IV - with GFR in 30-40's and baseline Cr 2 - 2.2 - now up likely in the setting of volume overload - Cr is trending down from 3.4 --> 3.1 --> 2.82 - continue with Torsemide as outline above  - per nephrology     Hepatic encephalopathy - continue rifaximin and lactulose  - more alert and able to communicate, follows commands     Hyponatremia - likely secondary to effects of ADH in the setting of liver cirrhosis  - improving but  needs close monitoring while on higher doses of lasix     Chronic thrombocytopenia  - in the setting of liver cirrhosis - diffuse petechiae noted, overall stable  - CBC in AM    UTI with hematuria - started Rocephin, continue day #3 - follow up on urine cultures     HHT (hereditary hemorrhagic telangiectasia) ([place])    Anemia due to chronic blood loss - Fe def - per nephrology team   DVT prophylaxis: SCD's Code Status: DNR Family Communication: Patient at bedside, wife at bedside  Disposition Plan: To be determined, likely home in few days   Consultants:   Cardiology   Nephrology   Procedures:   None  Antimicrobials:   Rocephin 7/6 -->  Subjective: Reports feeling tired.   Objective: Filed Vitals:   06/16/16 1333 06/16/16 2037 06/17/16 0505 06/17/16 1231  BP: 116/52 102/46 89/35 98/39   Pulse: 82 80 54 80  Temp:  98.2 F (36.8 C) 97.4 F (36.3 C) 97.8 F (36.6 C)  TempSrc:  Oral Oral Oral  Resp: 16 15 18 20   Height:      Weight:   79.379 kg (175 lb)   SpO2: 98% 94% 97% 96%    Intake/Output Summary (Last 24 hours) at 06/17/16 1433 Last data filed at 06/17/16 1415  Gross per 24 hour  Intake    862 ml  Output   1950 ml  Net  -1088 ml   [employer]  06/15/16 0804 06/16/16 0623 06/17/16 0505  Weight: 79.153 kg (174 lb 8 oz) 77.656 kg (171 lb 3.2 oz) 79.379 kg (175 lb)    Examination:  General exam: Appears more alert, follows commands appropriately  Respiratory system: Respiratory effort normal. Diminished at bases  Cardiovascular system: S1 & S2 heard, RRR. No JVD, rubs, gallops or clicks. SEM 2/6 Gastrointestinal system: Abdomen is nondistended, soft and nontender. No organomegaly or masses felt.  Central nervous system: somnolent but easy to awake. Follows commands appropriately  Skin: multiple petechiae   Data Reviewed: I have personally reviewed following labs and imaging studies  CBC:  Recent Labs Lab 06/14/16 1640 06/15/16 0714  06/16/16 0408 06/17/16 0430  WBC 5.7 4.7 4.4 4.8  NEUTROABS 4.5  --   --   --   HGB 8.0* 7.6* 7.6* 7.4*  HCT 25.0* 23.3* 23.1* 22.9*  MCV 88.0 87.3 87.5 87.7  PLT 72* 59* 55* 52*   Basic Metabolic Panel:  Recent Labs Lab 06/14/16 1640 06/14/16 1741 06/15/16 0714 06/16/16 0408 06/17/16 0430  NA 123* 124* 125* 128* 126*  K 5.8* 5.9* 5.1 3.9 3.7  CL 93* 94* 95* 93* 91*  CO2 20* 20* 22 25 23   GLUCOSE 122* 117* 88 90 96  BUN 76* 76* 81* 85* 84*  CREATININE 3.26* 3.34* 3.44* 3.18* 2.83*  CALCIUM 8.6* 8.6* 9.0 8.9 8.7*  MG  --   --  3.0*  --   --   PHOS  --   --  5.8* 5.5*  --    Liver Function Tests:  Recent Labs Lab 06/14/16 1741 06/15/16 0714 06/16/16 0408  AST 41 35  --   ALT 23 22  --   ALKPHOS 165* 154*  --   BILITOT 2.9* 2.7*  --   PROT 5.3* 5.1*  --   ALBUMIN 2.8* 2.5* 2.6*    Recent Labs Lab 06/14/16 1713  AMMONIA 118*   Cardiac Enzymes:  Recent Labs Lab 06/14/16 1943 06/15/16 0136 06/15/16 0714  TROPONINI 0.08* 0.07* 0.08*   BNP (last 3 results)  Recent Labs  08/09/15 1621  PROBNP 188.0*   Thyroid Function Tests:  Recent Labs  06/15/16 0136  TSH 7.642*   Urine analysis:    Component Value Date/Time   COLORURINE YELLOW 06/15/2016 0721   APPEARANCEUR CLEAR 06/15/2016 0721   LABSPEC 1.011 06/15/2016 0721   PHURINE 6.5 06/15/2016 0721   GLUCOSEU NEGATIVE 06/15/2016 0721   HGBUR LARGE* 06/15/2016 0721   BILIRUBINUR NEGATIVE 06/15/2016 0721   KETONESUR NEGATIVE 06/15/2016 0721   PROTEINUR 100* 06/15/2016 0721   UROBILINOGEN 0.2 08/10/2015 1700   NITRITE NEGATIVE 06/15/2016 0721   LEUKOCYTESUR MODERATE* 06/15/2016 0721     Radiology Studies: No results found.    Scheduled Meds: . cefTRIAXone (ROCEPHIN)  IV  1 g Intravenous Q24H  . darbepoetin (ARANESP) injection - NON-DIALYSIS  100 mcg Subcutaneous Q Thu-1800  . digoxin  0.125 mg Oral Q M,W,F  . feeding supplement (ENSURE ENLIVE)  237 mL Oral BID BM  . ferrous sulfate   325 mg Oral BID WC  . lactulose  30 g Oral BID  . pantoprazole  40 mg Oral Daily  . rifaximin  550 mg Oral BID  . sodium chloride flush  3 mL Intravenous Q12H  . spironolactone  50 mg Oral Daily   And  . spironolactone  25 mg Oral QHS  . torsemide  100 mg Oral Daily   Continuous Infusions:    LOS: 3 days  Time spent: 20 minutes    Faye Ramsay, MD [facility] Pager [phone]  If 7PM-7AM, please contact night-coverage [email or web address] Password [facility] 06/17/2016, 2:33 PM

## 2016-06-17 NOTE — Progress Notes (Signed)
Admit: 06/14/2016 LOS: 3  85M with cirrhosis, sCHF, admit with AoCKD + hypervolemia + hyperkalemia  Subjective:  Ongoing good UOP Weight up? Not sure credible Eating well, more awake/alert Wife at bedside, updated SCr improving   07/07 0701 - 07/08 0700 In: [id number] [P.O.:822; IV Piggyback:217] Out: 2250 [Urine:2250]  Filed Weights   06/15/16 0804 06/16/16 0623 06/17/16 0505  Weight: 79.153 kg (174 lb 8 oz) 77.656 kg (171 lb 3.2 oz) 79.379 kg (175 lb)    Scheduled Meds: . cefTRIAXone (ROCEPHIN)  IV  1 g Intravenous Q24H  . darbepoetin (ARANESP) injection - NON-DIALYSIS  100 mcg Subcutaneous Q Thu-1800  . digoxin  0.125 mg Oral Q M,W,F  . feeding supplement (ENSURE ENLIVE)  237 mL Oral BID BM  . ferrous sulfate  325 mg Oral BID WC  . lactulose  30 g Oral BID  . pantoprazole  40 mg Oral Daily  . patiromer  8.4 g Oral Daily  . rifaximin  550 mg Oral BID  . sodium chloride flush  3 mL Intravenous Q12H  . spironolactone  50 mg Oral Daily   And  . spironolactone  25 mg Oral QHS  . torsemide  100 mg Oral Daily   Continuous Infusions:  PRN Meds:.acetaminophen **OR** acetaminophen, Gerhardt's butt cream, HYDROcodone-acetaminophen, ondansetron **OR** ondansetron (ZOFRAN) IV  Current Labs: reviewed 7/7: TSAT 6%; Ferritin 35, Hb 7.6 MCV 87     Physical Exam:  Blood pressure 89/35, pulse 54, temperature 97.4 F (36.3 C), temperature source Oral, resp. rate 18, height 6' (1.829 m), weight 79.379 kg (175 lb), SpO2 97 %. NAD, awake, slow in response RRR nl s1s2 Foley in place Distended, soft Nonfocal  A 1. AoCKD3; improving 1. BL SCr 2.1-2.3 2. sCHF with Hypervolemia 3. Cirrhosis 4. HHT 5. Recurrent GIB 6. Chronic Anemia with Fe deficiency -- s/p feraheme 510mg  IV 7/7 7. Hyponatremia, hypervolemic 8. Hyperkalemia, improved; on patiromer  P 1. Rec IV lasix this AM, resume PO Torsemide, now at 100mg  2. 1L Fluid restriction given hyponatremia / vol issues 3. Stop  Patiromer  Pearson Grippe MD 06/17/2016, 8:01 AM   Recent Labs Lab 06/15/16 0714 06/16/16 0408 06/17/16 0430  NA 125* 128* 126*  K 5.1 3.9 3.7  CL 95* 93* 91*  CO2 22 25 23   GLUCOSE 88 90 96  BUN 81* 85* 84*  CREATININE 3.44* 3.18* 2.83*  CALCIUM 9.0 8.9 8.7*  PHOS 5.8* 5.5*  --     Recent Labs Lab 06/14/16 1640 06/15/16 0714 06/16/16 0408 06/17/16 0430  WBC 5.7 4.7 4.4 4.8  NEUTROABS 4.5  --   --   --   HGB 8.0* 7.6* 7.6* 7.4*  HCT 25.0* 23.3* 23.1* 22.9*  MCV 88.0 87.3 87.5 87.7  PLT 72* 59* 55* 52*

## 2016-06-17 NOTE — Progress Notes (Signed)
Advanced Heart Failure Rounding Note  Referring Provider: Dr Doyle Askew Primary Cardiologist: Dr. Claude Manges, in [place], had also been acting as PCP Primary HF: Dr. Haroldine Laws  PCP: Randel Pigg  Reason for Consultation: A/C combined CHF in setting of AKI.   Subjective:    Much more alert this morning. Feeling better. Good diuresis on IV lasix. Now switching to po. No SOB.   Renal function nearing baseline.   Objective:   Weight Range: 79.379 kg (175 lb) Body mass index is 23.73 kg/(m^2).   Vital Signs:   Temp:  [97.4 F (36.3 C)-98.2 F (36.8 C)] 97.4 F (36.3 C) (07/08 0505) Pulse Rate:  [54-82] 54 (07/08 0505) Resp:  [15-18] 18 (07/08 0505) BP: (89-116)/(35-52) 89/35 mmHg (07/08 0505) SpO2:  [94 %-98 %] 97 % (07/08 0505) Weight:  [79.379 kg (175 lb)] 79.379 kg (175 lb) (07/08 0505) Last BM Date: 06/16/16  Weight change: Filed Weights   06/15/16 0804 06/16/16 0623 06/17/16 0505  Weight: 79.153 kg (174 lb 8 oz) 77.656 kg (171 lb 3.2 oz) 79.379 kg (175 lb)    Intake/Output:   Intake/Output Summary (Last 24 hours) at 06/17/16 0947 Last data filed at 06/17/16 0901  Gross per 24 hour  Intake   1159 ml  Output   2250 ml  Net  -1091 ml     Physical Exam: General: Elderly and ill appearing, NAD, Sitting up in bed.  HEENT: Diffuse telengectasias, otherwise normal Neck: supple. JVP 7-8 Carotids 2+ bilat; no bruits. No thyromegaly or nodule noted Cor: PMI nondisplaced. RRR. II/VI SEM at LUSB and apex Lungs: Diminished basilar L>R Abdomen: soft, non-tender, non-distended, no HSM. No bruits or masses. +BS  Extremities: no cyanosis, clubbing, rash. Trace LE edema, + mild asterixis Neuro: alert & orientedx3, cranial nerves grossly intact. moves all 4 extremities w/o difficulty. Affect flat.    Telemetry: Reviewed, V pacing in 80s  Labs: CBC  Recent Labs  06/14/16 1640  06/16/16 0408 06/17/16 0430  WBC 5.7  < > 4.4 4.8  NEUTROABS 4.5  --   --   --   HGB 8.0*   < > 7.6* 7.4*  HCT 25.0*  < > 23.1* 22.9*  MCV 88.0  < > 87.5 87.7  PLT 72*  < > 55* 52*  < > = values in this interval not displayed. Basic Metabolic Panel  Recent Labs  06/15/16 0714 06/16/16 0408 06/17/16 0430  NA 125* 128* 126*  K 5.1 3.9 3.7  CL 95* 93* 91*  CO2 22 25 23   GLUCOSE 88 90 96  BUN 81* 85* 84*  CREATININE 3.44* 3.18* 2.83*  CALCIUM 9.0 8.9 8.7*  MG 3.0*  --   --   PHOS 5.8* 5.5*  --    Liver Function Tests  Recent Labs  06/14/16 1741 06/15/16 0714 06/16/16 0408  AST 41 35  --   ALT 23 22  --   ALKPHOS 165* 154*  --   BILITOT 2.9* 2.7*  --   PROT 5.3* 5.1*  --   ALBUMIN 2.8* 2.5* 2.6*   No results for input(s): LIPASE, AMYLASE in the last 72 hours. Cardiac Enzymes  Recent Labs  06/14/16 1943 06/15/16 0136 06/15/16 0714  TROPONINI 0.08* 0.07* 0.08*    BNP: BNP (last 3 results)  Recent Labs  09/02/15 1239 06/14/16 1640  BNP 280.5* 340.4*    ProBNP (last 3 results)  Recent Labs  08/09/15 1621  PROBNP 188.0*     D-Dimer No results  for input(s): DDIMER in the last 72 hours. Hemoglobin A1C No results for input(s): HGBA1C in the last 72 hours. Fasting Lipid Panel No results for input(s): CHOL, HDL, LDLCALC, TRIG, CHOLHDL, LDLDIRECT in the last 72 hours. Thyroid Function Tests  Recent Labs  06/15/16 0136  TSH 7.642*    Other results:     Imaging/Studies:  US Renal  06/15/2016  CLINICAL DATA:  Acute renal failure EXAM: RENAL / URINARY TRACT ULTRASOUND COMPLETE COMPARISON:  None. FINDINGS: Right Kidney: Length: 12.7 cm. Mildly echogenic right renal parenchyma. No right hydronephrosis. Questionable hyperechoic 0.8 x 0.6 x 0.9 cm renal cortical lesion in the upper right kidney (possibly artifactual due to junctional defect). Several tiny simple appearing renal cortical cysts in the right kidney, largest 1.2 x 1.0 x 1.0 cm in the interpolar right kidney. Left Kidney: Length: 12.2 cm. Mildly echogenic left renal parenchyma. No  left hydronephrosis. Simple appearing small renal cortical cysts in the left kidney, largest 1.6 x 1.3 x 0.7 cm in the lateral left kidney. Bladder: Urinary bladder is collapsed by indwelling Foley catheter and cannot be evaluated on this scan. IMPRESSION: 1. No hydronephrosis. 2. Mildly echogenic kidneys, indicating nonspecific renal parenchymal disease of uncertain chronicity. 3. Questionable hyperechoic 0.9 cm renal cortical lesion in the upper right kidney, neoplasm not excluded. Recommend further evaluation with renal mass protocol MRI or CT abdomen without and with IV contrast on a short term outpatient basis. 4. Urinary bladder collapsed by indwelling Foley catheter and not evaluated on this scan. Electronically Signed   By: Ilona Sorrel M.D.   On: 06/15/2016 12:00    Latest Echo  Latest Cath   Medications:     Scheduled Medications: . cefTRIAXone (ROCEPHIN)  IV  1 g Intravenous Q24H  . darbepoetin (ARANESP) injection - NON-DIALYSIS  100 mcg Subcutaneous Q Thu-1800  . digoxin  0.125 mg Oral Q M,W,F  . feeding supplement (ENSURE ENLIVE)  237 mL Oral BID BM  . ferrous sulfate  325 mg Oral BID WC  . lactulose  30 g Oral BID  . pantoprazole  40 mg Oral Daily  . rifaximin  550 mg Oral BID  . sodium chloride flush  3 mL Intravenous Q12H  . spironolactone  50 mg Oral Daily   And  . spironolactone  25 mg Oral QHS  . torsemide  100 mg Oral Daily    Infusions:    PRN Medications: acetaminophen **OR** acetaminophen, Gerhardt's butt cream, HYDROcodone-acetaminophen, ondansetron **OR** ondansetron (ZOFRAN) IV   Assessment   1. Acute on chronic diastolic CHF Echo Q000111Q 45-50%.  2. ARF on CKD Stage III  3. Liver failure / cirrhosis 4. Pulmonary hypertension on Echo 08/11/15 PA peak pressure 51 mm Hg 5. Acute on Chronic Anemia 6. Hereditary telangiectasia. 7. Hepatic encephalopathy 8. Hyperkalemia  Plan     Much improved. Volume status better. Creatinine near baseline. Renal  managing diuretics. We will sign off. He will f/u with HF Clinic after discharge.   Bensimhon, Daniel,MD 9:47 AM Advanced Heart Failure Team Pager [phone] (M-F; 7a - 4p)  Please contact [facility] for night-coverage after hours (4p -7a ) and weekends on [email or web address]

## 2016-06-18 LAB — BASIC METABOLIC PANEL
Anion gap: 11 (ref 5–15)
BUN: 81 mg/dL — ABNORMAL HIGH (ref 6–20)
CALCIUM: 8.8 mg/dL — AB (ref 8.9–10.3)
CO2: 25 mmol/L (ref 22–32)
Chloride: 89 mmol/L — ABNORMAL LOW (ref 101–111)
Creatinine, Ser: 2.68 mg/dL — ABNORMAL HIGH (ref 0.61–1.24)
GFR, EST AFRICAN AMERICAN: 25 mL/min — AB (ref >60)
GFR, EST NON AFRICAN AMERICAN: 21 mL/min — AB (ref >60)
Glucose, Bld: 180 mg/dL — ABNORMAL HIGH (ref 65–99)
POTASSIUM: 3.4 mmol/L — AB (ref 3.5–5.1)
SODIUM: 125 mmol/L — AB (ref 135–145)

## 2016-06-18 NOTE — Progress Notes (Signed)
Patient ID: Hunter [name], male   DOB: 04-16-1935, 81 y.o.   MRN: [id number]    PROGRESS NOTE    Hunter [name]  [name] DOB: 03-18-1935 DOA: 06/14/2016  PCP: Penni Homans, MD   Brief Narrative:  80 y.o. male with known systolic and diastolic HF Echo [id number] EF 45-50% NYHA class III, pulmonary hypertension, liver failure, CKD stage III, and hereditary hemorrhagic telangiectasia with hepatic AVMs and associated anemia requiring transfusion, now presented to [facility] ED with main concern of several days duration of progressively worsening dyspnea with exertion and at rest. Admission labs notable for Troponin elevated but flat (0.08 -> 0.07 -> 0.08). UA with many bacteria and Large Hgb, moderate leukocytes and protein. CXR with pulmonary vascular congestion, ? asymmetric edema versus infection.   Cardiology and nephrology teams consulted, Nephrology team, placed on Lasix 120 mg IV and weight is down 8 lbs.   Assessment & Plan:   Active Problems:   Acute on chronic combined systolic and diastolic CHF, NYHA class III - last ECHO in 2016 with EF 45% and appears unchanged based on repeat ECHO on this admission - weight is down from admission: 182 --> 174 --> 171 --> 175 lbs this AM - was on lasix 80 mg IV BID, changed to Torsemide 100 mg PO QD 7/8 - pt clinically stable this AM  - appreciate nephrology and cardiology teams following  - cardiology team signed off as there are no further recommendations     Acute on chronic kidney disease stage III - IV - with GFR in 30-40's and baseline Cr 2 - 2.2 - now up likely in the setting of volume overload - Cr is trending down from 3.4 --> 3.1 --> 2.82 --> 2.68 - continue with Torsemide as outline above  - per nephrology     Hepatic encephalopathy - continue rifaximin and lactulose  - more alert and able to communicate, follows commands appropriately - attempt PT today, OOB to chair     Hyponatremia - likely secondary to effects of ADH in the  setting of liver cirrhosis  - improving but needs close monitoring while on higher doses of lasix     Chronic thrombocytopenia  - in the setting of liver cirrhosis - diffuse petechiae noted, overall stable  - CBC in AM    UTI with hematuria - started Rocephin, continue day #4/5 - follow up on urine cultures     HHT (hereditary hemorrhagic telangiectasia) ([place])    Anemia due to chronic blood loss - Fe def - per nephrology team    DVT prophylaxis: SCD's Code Status: DNR Family Communication: Patient at bedside, wife at bedside  Disposition Plan: To be determined, likely home in 1-2 days   Consultants:   Cardiology   Nephrology   Procedures:   None  Antimicrobials:   Rocephin 7/6 -->  Subjective: Reports feeling tired.   Objective: Filed Vitals:   06/17/16 0505 06/17/16 1231 06/17/16 2208 06/18/16 0609  BP: 89/35 98/39 95/49  99/50  Pulse: 54 80 80 74  Temp: 97.4 F (36.3 C) 97.8 F (36.6 C) 98.3 F (36.8 C) 97.9 F (36.6 C)  TempSrc: Oral Oral Oral Oral  Resp: 18 20 19    Height:      Weight: 79.379 kg (175 lb)   79.516 kg (175 lb 4.8 oz)  SpO2: 97% 96% 98% 96%    Intake/Output Summary (Last 24 hours) at 06/18/16 0724 Last data filed at 06/18/16 0609  Gross per 24 hour  Intake  840 ml  Output   2075 ml  Net  -1235 ml   Filed Weights   06/16/16 0623 06/17/16 0505 06/18/16 0609  Weight: 77.656 kg (171 lb 3.2 oz) 79.379 kg (175 lb) 79.516 kg (175 lb 4.8 oz)    Examination:  General exam: Appears more alert, follows commands appropriately  Respiratory system: Respiratory effort normal. Diminished at bases  Cardiovascular system: S1 & S2 heard, RRR. No JVD, rubs, gallops or clicks. SEM 2/6 Gastrointestinal system: Abdomen is nondistended, soft and nontender. No organomegaly or masses felt.  Central nervous system: Follows commands appropriately  Skin: multiple petechiae   Data Reviewed: I have personally reviewed following labs and imaging  studies  CBC:  Recent Labs Lab 06/14/16 1640 06/15/16 0714 06/16/16 0408 06/17/16 0430  WBC 5.7 4.7 4.4 4.8  NEUTROABS 4.5  --   --   --   HGB 8.0* 7.6* 7.6* 7.4*  HCT 25.0* 23.3* 23.1* 22.9*  MCV 88.0 87.3 87.5 87.7  PLT 72* 59* 55* 52*   Basic Metabolic Panel:  Recent Labs Lab 06/14/16 1640 06/14/16 1741 06/15/16 0714 06/16/16 0408 06/17/16 0430  NA 123* 124* 125* 128* 126*  K 5.8* 5.9* 5.1 3.9 3.7  CL 93* 94* 95* 93* 91*  CO2 20* 20* 22 25 23   GLUCOSE 122* 117* 88 90 96  BUN 76* 76* 81* 85* 84*  CREATININE 3.26* 3.34* 3.44* 3.18* 2.83*  CALCIUM 8.6* 8.6* 9.0 8.9 8.7*  MG  --   --  3.0*  --   --   PHOS  --   --  5.8* 5.5*  --    Liver Function Tests:  Recent Labs Lab 06/14/16 1741 06/15/16 0714 06/16/16 0408  AST 41 35  --   ALT 23 22  --   ALKPHOS 165* 154*  --   BILITOT 2.9* 2.7*  --   PROT 5.3* 5.1*  --   ALBUMIN 2.8* 2.5* 2.6*    Recent Labs Lab 06/14/16 1713  AMMONIA 118*   Cardiac Enzymes:  Recent Labs Lab 06/14/16 1943 06/15/16 0136 06/15/16 0714  TROPONINI 0.08* 0.07* 0.08*   BNP (last 3 results)  Recent Labs  08/09/15 1621  PROBNP 188.0*   Urine analysis:    Component Value Date/Time   COLORURINE YELLOW 06/15/2016 0721   APPEARANCEUR CLEAR 06/15/2016 0721   LABSPEC 1.011 06/15/2016 0721   PHURINE 6.5 06/15/2016 0721   GLUCOSEU NEGATIVE 06/15/2016 0721   HGBUR LARGE* 06/15/2016 0721   BILIRUBINUR NEGATIVE 06/15/2016 0721   KETONESUR NEGATIVE 06/15/2016 0721   PROTEINUR 100* 06/15/2016 0721   UROBILINOGEN 0.2 08/10/2015 1700   NITRITE NEGATIVE 06/15/2016 0721   LEUKOCYTESUR MODERATE* 06/15/2016 0721   Radiology Studies: No results found.  Scheduled Meds: . cefTRIAXone (ROCEPHIN)  IV  1 g Intravenous Q24H  . darbepoetin (ARANESP) injection - NON-DIALYSIS  100 mcg Subcutaneous Q Thu-1800  . digoxin  0.125 mg Oral Q M,W,F  . feeding supplement (ENSURE ENLIVE)  237 mL Oral BID BM  . ferrous sulfate  325 mg Oral BID  WC  . lactulose  30 g Oral BID  . pantoprazole  40 mg Oral Daily  . rifaximin  550 mg Oral BID  . sodium chloride flush  3 mL Intravenous Q12H  . spironolactone  50 mg Oral Daily   And  . spironolactone  25 mg Oral QHS  . torsemide  100 mg Oral Daily   Continuous Infusions:    LOS: 4 days    Time  spent: 20 minutes    Faye Ramsay, MD [facility] Pager [phone]  If 7PM-7AM, please contact night-coverage [email or web address] Password [email or web address] 06/18/2016, 7:24 AM

## 2016-06-18 NOTE — Evaluation (Signed)
Physical Therapy Evaluation Patient Details Name: Hunter [name] MRN: [id number] DOB: 27-Feb-1935 Today's Date: 06/18/2016   History of Present Illness  Pt is a 81 y/o M who presented with SOB.  Pt's PMH inlcudes recurrent epistaxis, chronic anemia due to chronic blood loss (recently admitted secondary to vomiting bright red blood), hepatic AVM, HF, s/p pacemaker, recurrent hepatic encephalopathy, CKD.  Pt with UTI, CXR with pulmonary vascular congestion.    Clinical Impression  Pt admitted with above diagnosis. Pt currently with functional limitations due to the deficits listed below (see PT Problem List). Hunter [name] presents confused and with generalized weakness, currently requiring +2 min assist for transfers.  Wife is unable to provide physical assist pt currently requires. Pt will benefit from skilled PT to increase their independence and safety with mobility to allow discharge to the venue listed below.      Follow Up Recommendations SNF;Supervision/Assistance - 24 hour    Equipment Recommendations  Other (comment) (TBD at next venue of care)    Recommendations for Other Services OT consult     Precautions / Restrictions Precautions Precautions: Fall;Other (comment) Precaution Comments: monitor O2, BP Restrictions Weight Bearing Restrictions: No      Mobility  Bed Mobility Overal bed mobility: Needs Assistance Bed Mobility: Supine to Sit     Supine to sit: Min guard;HOB elevated     General bed mobility comments: Increased time and effort  Transfers Overall transfer level: Needs assistance Equipment used: Rolling walker (2 wheeled) Transfers: Sit to/from [facility] Sit to Stand: Min assist;+2 physical assistance;+2 safety/equipment Stand pivot transfers: Min assist;+2 physical assistance;+2 safety/equipment       General transfer comment: Assist to steady with transfers and to manage RW during pivot.  Directional cues provided as well as verbal  cues to reach back for armrests to sit.  Pt demonstrates flexed posture.  Ambulation/Gait                Stairs            Wheelchair Mobility    Modified Rankin (Stroke Patients Only)       Balance Overall balance assessment: Needs assistance   Sitting balance-Leahy Scale: Poor Sitting balance - Comments: Relies on UE support while sitting EOB with posterior lean Postural control: Posterior lean Standing balance support: Bilateral upper extremity supported;During functional activity Standing balance-Leahy Scale: Poor Standing balance comment: Relies on RW and physical assist to steady                             Pertinent Vitals/Pain Pain Assessment: Faces Faces Pain Scale: Hurts even more (reports 0/10 after sitting in chair) Pain Location: low back Pain Descriptors / Indicators: Discomfort;Grimacing Pain Intervention(s): Limited activity within patient's tolerance;Monitored during session;Repositioned    Home Living Family/patient expects to be discharged to:: Skilled nursing facility Living Arrangements: Spouse/significant other Available Help at Discharge: Family;Available 24 hours/day Type of Home: House Home Access: Stairs to enter Entrance Stairs-Rails: Left Entrance Stairs-Number of Steps: 2 Home Layout: One level Home Equipment: Walker - 2 wheels;Shower seat Additional Comments: Question reliability of information provided as pt confused.  Pt reports he has a HHRN who comes 1x/wk to check his BP and to occassionally draw blood.    Prior Function Level of Independence: Needs assistance   Gait / Transfers Assistance Needed: Ambulates with RW  ADL's / Homemaking Assistance Needed: Wife assists with bathing, meal prep, housekeeping, med management  Comments:  Question reliability of information provided as pt confused.     Hand Dominance   Dominant Hand: Right    Extremity/Trunk Assessment   Upper Extremity Assessment: Defer to OT  evaluation           Lower Extremity Assessment: Generalized weakness      Cervical / Trunk Assessment: Kyphotic  Communication   Communication: HOH  Cognition Arousal/Alertness: Awake/alert Behavior During Therapy: WFL for tasks assessed/performed Overall Cognitive Status: No family/caregiver present to determine baseline cognitive functioning                      General Comments General comments (skin integrity, edema, etc.): BP 101/47 supine and 105/48 sitting EOB.  Pt denies dizziness.  SpO2 remains in mid 90s on RA,    Exercises General Exercises - Lower Extremity Ankle Circles/Pumps: Both;5 reps;Supine      Assessment/Plan    PT Assessment Patient needs continued PT services  PT Diagnosis Difficulty walking   PT Problem List Decreased strength;Decreased activity tolerance;Decreased balance;Decreased mobility;Decreased cognition;Decreased knowledge of use of DME;Decreased safety awareness;Pain  PT Treatment Interventions DME instruction;Gait training;Stair training;Functional mobility training;Therapeutic activities;Therapeutic exercise;Balance training;Cognitive remediation;Patient/family education   PT Goals (Current goals can be found in the Care Plan section) Acute Rehab PT Goals Patient Stated Goal: to go to rehab before home PT Goal Formulation: With patient Time For Goal Achievement: 07/02/16 Potential to Achieve Goals: Good    Frequency Min 2X/week   Barriers to discharge Inaccessible home environment Steps to enter home    Co-evaluation               End of Session Equipment Utilized During Treatment: Gait belt Activity Tolerance: Patient limited by fatigue;Patient tolerated treatment well Patient left: in chair;with call bell/phone within reach;with chair alarm set;Other (comment) (with pillows in seat for comfort ) Nurse Communication: Mobility status;Other (comment) (BP)         Time: [id number] PT Time Calculation (min)  (ACUTE ONLY): 28 min   Charges:   PT Evaluation $PT Eval Moderate Complexity: 1 Procedure     PT G Codes:       Collie Siad PT, DPT  Pager: [phone] Phone: [phone] 06/18/2016, 1:51 PM

## 2016-06-18 NOTE — Progress Notes (Signed)
Admit: 06/14/2016 LOS: 4  78M with cirrhosis, sCHF, admit with AoCKD + hypervolemia + hyperkalemia  Subjective:  Good UOP  Now on Torsemide + aldactone No labs yet this AM BP stable  07/08 0701 - 07/09 0700 In: 840 [P.O.:840] Out: 2075 [Urine:2075]  Filed Weights   06/16/16 0623 06/17/16 0505 06/18/16 0609  Weight: 77.656 kg (171 lb 3.2 oz) 79.379 kg (175 lb) 79.516 kg (175 lb 4.8 oz)    Scheduled Meds: . cefTRIAXone (ROCEPHIN)  IV  1 g Intravenous Q24H  . darbepoetin (ARANESP) injection - NON-DIALYSIS  100 mcg Subcutaneous Q Thu-1800  . digoxin  0.125 mg Oral Q M,W,F  . feeding supplement (ENSURE ENLIVE)  237 mL Oral BID BM  . ferrous sulfate  325 mg Oral BID WC  . lactulose  30 g Oral BID  . pantoprazole  40 mg Oral Daily  . rifaximin  550 mg Oral BID  . sodium chloride flush  3 mL Intravenous Q12H  . spironolactone  50 mg Oral Daily   And  . spironolactone  25 mg Oral QHS  . torsemide  100 mg Oral Daily   Continuous Infusions:  PRN Meds:.acetaminophen **OR** acetaminophen, Gerhardt's butt cream, HYDROcodone-acetaminophen, ondansetron **OR** ondansetron (ZOFRAN) IV  Current Labs: reviewed 7/7: TSAT 6%; Ferritin 35, Hb 7.6 MCV 87     Physical Exam:  Blood pressure 99/50, pulse 74, temperature 97.9 F (36.6 C), temperature source Oral, resp. rate 19, height 6' (1.829 m), weight 79.516 kg (175 lb 4.8 oz), SpO2 96 %. NAD, resting and awakens RRR nl s1s2 Foley in place Distended, soft Nonfocal  A 1. AoCKD3; improving 1. BL SCr 2.1-2.3 2. sCHF with Hypervolemia; improving 3. Cirrhosis 4. HHT 5. Recurrent GIB 6. Chronic Anemia with Fe deficiency -- s/p feraheme 510mg  IV 7/7 7. Hyponatremia, hypervolemic 8. Hyperkalemia, improved; doesn't need chronic resin 9. UTI on Ceftriaxone; U Cx NGF  P 1. Cont PO diuretics, await labs this AM 2. 1L Fluid restriction given hyponatremia / vol issues 3. PT/OT   Pearson Grippe MD 06/18/2016, 7:46 AM   Recent Labs Lab  06/15/16 0714 06/16/16 0408 06/17/16 0430  NA 125* 128* 126*  K 5.1 3.9 3.7  CL 95* 93* 91*  CO2 22 25 23   GLUCOSE 88 90 96  BUN 81* 85* 84*  CREATININE 3.44* 3.18* 2.83*  CALCIUM 9.0 8.9 8.7*  PHOS 5.8* 5.5*  --     Recent Labs Lab 06/14/16 1640 06/15/16 0714 06/16/16 0408 06/17/16 0430  WBC 5.7 4.7 4.4 4.8  NEUTROABS 4.5  --   --   --   HGB 8.0* 7.6* 7.6* 7.4*  HCT 25.0* 23.3* 23.1* 22.9*  MCV 88.0 87.3 87.5 87.7  PLT 72* 59* 55* 52*

## 2016-06-19 LAB — CBC
HCT: 24.3 % — ABNORMAL LOW (ref 39.0–52.0)
Hemoglobin: 7.8 g/dL — ABNORMAL LOW (ref 13.0–17.0)
MCH: 28.6 pg (ref 26.0–34.0)
MCHC: 32.1 g/dL (ref 30.0–36.0)
MCV: 89 fL (ref 78.0–100.0)
PLATELETS: 64 10*3/uL — AB (ref 150–400)
RBC: 2.73 MIL/uL — ABNORMAL LOW (ref 4.22–5.81)
RDW: 19.5 % — AB (ref 11.5–15.5)
WBC: 7.1 10*3/uL (ref 4.0–10.5)

## 2016-06-19 LAB — BASIC METABOLIC PANEL
Anion gap: 12 (ref 5–15)
BUN: 80 mg/dL — AB (ref 6–20)
CALCIUM: 8.9 mg/dL (ref 8.9–10.3)
CO2: 25 mmol/L (ref 22–32)
CREATININE: 2.43 mg/dL — AB (ref 0.61–1.24)
Chloride: 86 mmol/L — ABNORMAL LOW (ref 101–111)
GFR calc non Af Amer: 24 mL/min — ABNORMAL LOW (ref >60)
GFR, EST AFRICAN AMERICAN: 28 mL/min — AB (ref >60)
Glucose, Bld: 101 mg/dL — ABNORMAL HIGH (ref 65–99)
Potassium: 4 mmol/L (ref 3.5–5.1)
SODIUM: 123 mmol/L — AB (ref 135–145)

## 2016-06-19 NOTE — Consult Note (Addendum)
WOC wound consult note Reason for Consult: Consult requested for buttock wound.  Pressure Ulcer POA: Yes; this was noted as a stage 3 pressure injury present on admission by the bedside nurse on 7/5 flow sheet. Pt has been frequently incontinent of stool and has a Flexiseal to attempt to contain, but still leaks around the insertion site and it is difficult to keep the dressing over the buttock wound from becoming soiled. Measurement: Right buttock 1.2X1.2X.2cm Wound bed: 85% red, 15% yellow patchy areas interspersed throughout. Drainage (amount, consistency, odor) Mod amt yellow drainage, no odor Periwound: Surrounding skin with red macerated skin and patchy areas of partial thickness skin loss; appearance consistent with moisture associated skin damage.   Dressing procedure/placement/frequency: Alginate to absorb moisture and promote healing and foam dressing to attempt to protect from soiling. Please re-consult if further assistance is needed.  Thank-you,  Julien Girt MSN, [place], [place], [place], [place]

## 2016-06-19 NOTE — Clinical Social Work Placement (Signed)
   CLINICAL SOCIAL WORK PLACEMENT  NOTE  Date:  06/19/2016  Patient Details  Name: Hunter [name] MRN: [id number] Date of Birth: [birth year]  Clinical Social Work is seeking post-discharge placement for this patient at the [place] level of care (*CSW will initial, date and re-position this form in  chart as items are completed):  Yes   Patient/family provided with [facility] Work Department's list of facilities offering this level of care within the geographic area requested by the patient (or if unable, by the patient's family).  Yes   Patient/family informed of their freedom to choose among providers that offer the needed level of care, that participate in Medicare, Medicaid or managed care program needed by the patient, have an available bed and are willing to accept the patient.  Yes   Patient/family informed of [facility]'s ownership interest in [facility] and [facility], as well as of the fact that they are under no obligation to receive care at these facilities.  PASRR submitted to EDS on 06/19/16     PASRR number received on 06/19/16     Existing PASRR number confirmed on       FL2 transmitted to all facilities in geographic area requested by pt/family on 06/19/16     FL2 transmitted to all facilities within larger geographic area on       Patient informed that his/her managed care company has contracts with or will negotiate with certain facilities, including the following:            Patient/family informed of bed offers received.  Patient chooses bed at       Physician recommends and patient chooses bed at      Patient to be transferred to   on  .  Patient to be transferred to facility by       Patient family notified on   of transfer.  Name of family member notified:        PHYSICIAN       Additional Comment:    _______________________________________________ Candie Chroman, LCSW 06/19/2016, 4:24 PM

## 2016-06-19 NOTE — Clinical Social Work Note (Signed)
Clinical Social Work Assessment  Patient Details  Name: Hunter [name] MRN: [id number] Date of Birth: 11/17/1935  Date of referral:  06/19/16               Reason for consult:  Facility Placement, Discharge Planning                Permission sought to share information with:  Facility Sport and exercise psychologist, Family Supports Permission granted to share information::  Yes, Verbal Permission Granted  Name::     Associate Professor::  SNF's  Relationship::  Wife  Contact Information:  [phone]  Housing/Transportation Living arrangements for the past 2 months:  [place] of Information:  Medical Team, Spouse Patient Interpreter Needed:  None Criminal Activity/Legal Involvement Pertinent to Current Situation/Hospitalization:  No - Comment as needed Significant Relationships:  Spouse Lives with:  Spouse Do you feel safe going back to the place where you live?  Yes Need for family participation in patient care:  Yes (Comment)  Care giving concerns:  PT recommending SNF when patient medically stable for discharge.   Social Worker assessment / plan:  CSW met with patient. Patient sleeping and did not arouse to conversation. Patient's wife at bedside. CSW introduced role and explained that discharge planning. Patient's wife stated that they had talked about SNF earlier today and were both agreeable. Discussed preferences: [name] and [name]. No further concerns. CSW encouraged patient's wife to contact CSW as needed. CSW will continue to follow patient and his wife and facilitate discharge to SNF once medically stable.  Employment status:  Retired Forensic scientist:  Medicare PT Recommendations:  [place] / Referral to community resources:  [employer]  Patient/Family's Response to care:  Patient asleep and did not arouse to conversation. Patient's wife states that they are both agreeable to SNF placement. Patient's wife supportive  and involved in patient's care. Patient's wife appreciated social work intervention.  Patient/Family's Understanding of and Emotional Response to Diagnosis, Current Treatment, and Prognosis:  Patient asleep and did not arouse to conversation. Patient's wife understands need for rehab prior to returning home. Patient's wife appears happy with treatment at hospital.  Emotional Assessment Appearance:  Appears stated age Attitude/Demeanor/Rapport:  Unable to Assess Affect (typically observed):  Unable to Assess Orientation:  Oriented to Self, Oriented to Place, Oriented to  Time, Oriented to Situation Alcohol / Substance use:  Never Used Psych involvement (Current and /or in the community):  No (Comment)  Discharge Needs  Concerns to be addressed:  Care Coordination Readmission within the last 30 days:  No Current discharge risk:  Dependent with Mobility Barriers to Discharge:  No Barriers Identified   Candie Chroman, LCSW 06/19/2016, 4:21 PM

## 2016-06-19 NOTE — Progress Notes (Signed)
Admit: 06/14/2016 LOS: 5  Hunter [name] with cirrhosis, sCHF, admit with AoCKD + hypervolemia + hyperkalemia  Subjective:  No new events Na level falling a tad Good UOP Wife at bedside, discussed overall poor prognosis and that numerous serious comrobidities are being temporized; she and he say that it is 'worth it' and to cont current approach to care  07/09 0701 - 07/10 0700 In: 783 [P.O.:780; I.V.:3] Out: 2250 [Urine:1850; Stool:400]  Filed Weights   06/17/16 0505 06/18/16 0609 06/19/16 0458  Weight: 79.379 kg (175 lb) 79.516 kg (175 lb 4.8 oz) 77.111 kg (170 lb)    Scheduled Meds: . cefTRIAXone (ROCEPHIN)  IV  1 g Intravenous Q24H  . darbepoetin (ARANESP) injection - NON-DIALYSIS  100 mcg Subcutaneous Q Thu-1800  . digoxin  0.125 mg Oral Q M,W,F  . feeding supplement (ENSURE ENLIVE)  237 mL Oral BID BM  . ferrous sulfate  325 mg Oral BID WC  . lactulose  30 g Oral BID  . pantoprazole  40 mg Oral Daily  . rifaximin  550 mg Oral BID  . sodium chloride flush  3 mL Intravenous Q12H  . spironolactone  50 mg Oral Daily   And  . spironolactone  25 mg Oral QHS  . torsemide  100 mg Oral Daily   Continuous Infusions:  PRN Meds:.acetaminophen **OR** acetaminophen, Gerhardt's butt cream, HYDROcodone-acetaminophen, ondansetron **OR** ondansetron (ZOFRAN) IV  Current Labs: reviewed 7/7: TSAT 6%; Ferritin 35, Hb 7.6 MCV 87     Physical Exam:  Blood pressure 95/44, pulse 79, temperature 98 F (36.7 C), temperature source Oral, resp. rate 20, height 6' (1.829 m), weight 77.111 kg (170 lb), SpO2 99 %. NAD, resting and awakens RRR nl s1s2 Foley in place Distended, soft Nonfocal  A 1. AoCKD3; improving and back to baseline 1. BL SCr 2.1-2.3 2. sCHF with Hypervolemia; improving 3. Cirrhosis 4. HHT 5. Recurrent GIB 6. Chronic Anemia with Fe deficiency -- s/p feraheme 510mg  IV 7/7 and on ESA 7. Hyponatremia, hypervolemic 8. Hyperkalemia, improved; doesn't need chronic resin 9. UTI on  Ceftriaxone; U Cx NGF  P 1. Cont PO diuretics at current dosing 2. Cont 1L Fluid restriction given hyponatremia / vol issues 3. PT/OT 4. Overall very poor prognosis with CHF, CKD, liver disease, hyponatremia; might want to have ongoing direct [place] conversations    Pearson Grippe MD 06/19/2016, 8:58 AM   Recent Labs Lab 06/15/16 0714 06/16/16 0408 06/17/16 0430 06/18/16 0946 06/19/16 0425  NA 125* 128* 126* 125* 123*  K 5.1 3.9 3.7 3.4* 4.0  CL 95* 93* 91* 89* 86*  CO2 22 25 23 25 25   GLUCOSE 88 90 96 180* 101*  BUN 81* 85* 84* 81* 80*  CREATININE 3.44* 3.18* 2.83* 2.68* 2.43*  CALCIUM 9.0 8.9 8.7* 8.8* 8.9  PHOS 5.8* 5.5*  --   --   --     Recent Labs Lab 06/14/16 1640  06/16/16 0408 06/17/16 0430 06/19/16 0425  WBC 5.7  < > 4.4 4.8 7.1  NEUTROABS 4.5  --   --   --   --   HGB 8.0*  < > 7.6* 7.4* 7.8*  HCT 25.0*  < > 23.1* 22.9* 24.3*  MCV 88.0  < > 87.5 87.7 89.0  PLT 72*  < > 55* 52* 64*  < > = values in this interval not displayed.

## 2016-06-19 NOTE — Progress Notes (Addendum)
Patient ID: Hunter [name], male   DOB: Jun 12, 1935, 81 y.o.   MRN: [id number]    PROGRESS NOTE    Hunter [name]  [name] DOB: [birth year] DOA: 06/14/2016  PCP: Penni Homans, MD   Brief Narrative:  81 y.o. male with known systolic and diastolic HF Echo [id number] EF 45-50% NYHA class III, pulmonary hypertension, liver failure, CKD stage III, and hereditary hemorrhagic telangiectasia with hepatic AVMs and associated anemia requiring transfusion, now presented to [facility] ED with main concern of several days duration of progressively worsening dyspnea with exertion and at rest. Admission labs notable for Troponin elevated but flat (0.08 -> 0.07 -> 0.08). UA with many bacteria and Large Hgb, moderate leukocytes and protein. CXR with pulmonary vascular congestion, ? asymmetric edema versus infection.   Cardiology and nephrology teams consulted, Nephrology team, placed on Lasix 120 mg IV and weight is down 8 lbs.   Assessment & Plan:   Active Problems:   Acute on chronic combined systolic and diastolic CHF, NYHA class III - last ECHO in 2016 with EF 45% and appears unchanged based on repeat ECHO on this admission - weight is down from admission: 182 --> 174 --> 171 --> 175 --> 170 lbs this AM - was on lasix 80 mg IV BID, changed to Torsemide 100 mg PO QD 7/8 - pt clinically stable this AM  - appreciate nephrology team following  - cardiology team signed off as there are no further recommendations     Acute on chronic kidney disease stage III - IV - with GFR in 30-40's and baseline Cr 2 - 2.2 - now up likely in the setting of volume overload - Cr is trending down from 3.4 --> 3.1 --> 2.82 --> 2.68 --> 2.43 - continue with Torsemide as outline above  - per nephrology     Hepatic encephalopathy - continue rifaximin and lactulose  - more alert and able to communicate, follows commands appropriately - attempt PT today, OOB to chair     Hyponatremia - likely secondary to effects of ADH in the  setting of liver cirrhosis  - Na trending down, may likely be new baseline  - BMP in AM    Chronic thrombocytopenia  - in the setting of liver cirrhosis - diffuse petechiae noted, overall stable  - CBC in AM    UTI with hematuria - started Rocephin, continue day #5/5 - follow up on urine cultures     HHT (hereditary hemorrhagic telangiectasia) (HCC)    Anemia due to chronic blood loss - Fe def - per nephrology team      Buttock wound, right  - Pressure Ulcer stage 3 pressure injury present on admission  - Flexiseal to attempt to contain, but still leaks around the insertion site and it is difficult to keep the dressing over the buttock wound from becoming soiled.   DVT prophylaxis: SCD's Code Status: DNR Family Communication: Patient at bedside, wife at bedside  Disposition Plan: SNF in 1-2 days, family in agreement   Consultants:   Cardiology   Nephrology   Procedures:   None  Antimicrobials:   Rocephin 7/6 -->  Subjective: Reports feeling tired.   Objective: Filed Vitals:   06/18/16 1211 06/18/16 2145 06/19/16 0458 06/19/16 1411  BP: 88/32 92/41 95/44  103/51  Pulse: 80 83 79 79  Temp: 97.7 F (36.5 C) 97.5 F (36.4 C) 98 F (36.7 C)   TempSrc: Oral Oral Oral   Resp: 20 20  18   Height:  Weight:   77.111 kg (170 lb)   SpO2: 99% 98% 99% 96%    Intake/Output Summary (Last 24 hours) at 06/19/16 1427 Last data filed at 06/19/16 1414  Gross per 24 hour  Intake    843 ml  Output   2000 ml  Net  -1157 ml   Filed Weights   06/17/16 0505 06/18/16 0609 06/19/16 0458  Weight: 79.379 kg (175 lb) 79.516 kg (175 lb 4.8 oz) 77.111 kg (170 lb)    Examination:  General exam: Appears more alert, follows commands appropriately  Respiratory system: Respiratory effort normal. Diminished at bases  Cardiovascular system: S1 & S2 heard, RRR. No JVD, rubs, gallops or clicks. SEM 2/6 Gastrointestinal system: Abdomen is nondistended, soft and nontender. No  organomegaly or masses felt.  Central nervous system: Follows commands appropriately  Skin: multiple petechiae   Data Reviewed: I have personally reviewed following labs and imaging studies  CBC:  Recent Labs Lab 06/14/16 1640 06/15/16 0714 06/16/16 0408 06/17/16 0430 06/19/16 0425  WBC 5.7 4.7 4.4 4.8 7.1  NEUTROABS 4.5  --   --   --   --   HGB 8.0* 7.6* 7.6* 7.4* 7.8*  HCT 25.0* 23.3* 23.1* 22.9* 24.3*  MCV 88.0 87.3 87.5 87.7 89.0  PLT 72* 59* 55* 52* 64*   Basic Metabolic Panel:  Recent Labs Lab 06/15/16 0714 06/16/16 0408 06/17/16 0430 06/18/16 0946 06/19/16 0425  NA 125* 128* 126* 125* 123*  K 5.1 3.9 3.7 3.4* 4.0  CL 95* 93* 91* 89* 86*  CO2 22 25 23 25 25   GLUCOSE 88 90 96 180* 101*  BUN 81* 85* 84* 81* 80*  CREATININE 3.44* 3.18* 2.83* 2.68* 2.43*  CALCIUM 9.0 8.9 8.7* 8.8* 8.9  MG 3.0*  --   --   --   --   PHOS 5.8* 5.5*  --   --   --    Liver Function Tests:  Recent Labs Lab 06/14/16 1741 06/15/16 0714 06/16/16 0408  AST 41 35  --   ALT 23 22  --   ALKPHOS 165* 154*  --   BILITOT 2.9* 2.7*  --   PROT 5.3* 5.1*  --   ALBUMIN 2.8* 2.5* 2.6*    Recent Labs Lab 06/14/16 1713  AMMONIA 118*   Cardiac Enzymes:  Recent Labs Lab 06/14/16 1943 06/15/16 0136 06/15/16 0714  TROPONINI 0.08* 0.07* 0.08*   BNP (last 3 results)  Recent Labs  08/09/15 1621  PROBNP 188.0*   Urine analysis:    Component Value Date/Time   COLORURINE YELLOW 06/15/2016 [place] 06/15/2016 0721   LABSPEC 1.011 06/15/2016 0721   PHURINE 6.5 06/15/2016 0721   GLUCOSEU NEGATIVE 06/15/2016 0721   HGBUR LARGE* 06/15/2016 0721   BILIRUBINUR NEGATIVE 06/15/2016 0721   KETONESUR NEGATIVE 06/15/2016 0721   PROTEINUR 100* 06/15/2016 0721   UROBILINOGEN 0.2 08/10/2015 1700   NITRITE NEGATIVE 06/15/2016 0721   LEUKOCYTESUR MODERATE* 06/15/2016 0721   Radiology Studies: No results found.  Scheduled Meds: . cefTRIAXone (ROCEPHIN)  IV  1 g  Intravenous Q24H  . darbepoetin (ARANESP) injection - NON-DIALYSIS  100 mcg Subcutaneous Q Thu-1800  . digoxin  0.125 mg Oral Q M,W,F  . feeding supplement (ENSURE ENLIVE)  237 mL Oral BID BM  . ferrous sulfate  325 mg Oral BID WC  . lactulose  30 g Oral BID  . pantoprazole  40 mg Oral Daily  . rifaximin  550 mg Oral BID  .  sodium chloride flush  3 mL Intravenous Q12H  . spironolactone  50 mg Oral Daily   And  . spironolactone  25 mg Oral QHS  . torsemide  100 mg Oral Daily   Continuous Infusions:    LOS: 5 days    Time spent: 20 minutes    Faye Ramsay, MD [facility] Pager [phone]  If 7PM-7AM, please contact night-coverage [email or web address] Password [email or web address] 06/19/2016, 2:27 PM

## 2016-06-19 NOTE — Care Management Important Message (Signed)
Important Message  Patient Details  Name: Hunter [name] MRN: [id number] Date of Birth: 1934/12/18   Medicare Important Message Given:  Yes    [name] 06/19/2016, 8:23 AM

## 2016-06-19 NOTE — NC FL2 (Signed)
[place] LEVEL OF CARE SCREENING TOOL     IDENTIFICATION  Patient Name: Hunter [name] Birthdate: 07-01-1935 Sex: male Admission Date (Current Location): 06/14/2016  [facility] and [place] Number:  Herbalist and Address:  [facility], [place] [address], [place], [place] [address]      Provider Number: [id number]  Attending Physician Name and Address:  Theodis Blaze, MD  Relative Name and Phone Number:       Current Level of Care: Hospital Recommended Level of Care: [place] Prior Approval Number:    Date Approved/Denied:   PASRR Number: [id number]  Discharge Plan: SNF    Current Diagnoses: Patient Active Problem List   Diagnosis Date Noted  . Acute renal failure (ARF) ([place])   . Pressure ulcer 06/15/2016  . Acute on chronic renal failure ([place]) 06/14/2016  . Hyperkalemia 06/14/2016  . Acute on chronic diastolic (congestive) heart failure ([place]) 06/14/2016  . CHF (congestive heart failure) ([place]) 06/14/2016  . Acute diastolic CHF (congestive heart failure) ([place]) 06/14/2016  . Nausea without vomiting 06/11/2016  . Diarrhea 05/09/2016  . Anemia due to other cause   . Aspiration of blood   . Epistaxis 05/01/2016  . S/P placement of cardiac pacemaker 04/29/2016  . Melanoma ([place])   . Hepatic encephalopathy ([place]) 09/15/2015  . Chronic diastolic heart failure ([place])   . Palliative care encounter 08/11/2015  . Hyponatremia   . Liver failure ([place])   . Cirrhosis ([place])   . Congestive heart disease ([place])   . Hepatic AV fistula ([place]) 08/09/2015  . Mitral regurgitation 08/09/2015  . HHT (hereditary hemorrhagic telangiectasia) ([place]) 08/09/2015  . Anemia due to chronic blood loss 08/09/2015  . Right heart failure ([place]) 08/09/2015  . 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 Tricuspid regurgitation 08/09/2015  . Chronic kidney disease 08/09/2015    Orientation RESPIRATION BLADDER Height & Weight     Self, Time, Situation,  Place  Normal Incontinent, Indwelling catheter Weight: 170 lb (77.111 kg) (bed scale) Height:  6' (182.9 cm)  BEHAVIORAL SYMPTOMS/MOOD NEUROLOGICAL BOWEL NUTRITION STATUS   (None)  (None) Incontinent (Rectal tube) Diet (Heart healthy. I L Fluid restriction.)  AMBULATORY STATUS COMMUNICATION OF NEEDS Skin   Extensive Assist Verbally Other (Comment), PU Stage and Appropriate Care (MASD mid buttocks. Ecchymosis right arm, left leg. Skin tear right arm, left elbow.)     PU Stage 3 Dressing:  (Foam prn)                 Personal Care Assistance Level of Assistance  Bathing, Feeding, Dressing Bathing Assistance: Limited assistance Feeding assistance: Independent Dressing Assistance: Limited assistance     Functional Limitations Info  Sight, Hearing, Speech Sight Info: Adequate Hearing Info: Adequate Speech Info: Adequate    SPECIAL CARE FACTORS FREQUENCY  PT (By licensed PT), OT (By licensed OT)     PT Frequency: 5 x week OT Frequency: 5 x week            Contractures Contractures Info: Not present    Additional Factors Info  Code Status, Allergies Code Status Info: DNR Allergies Info: NKDA           Current Medications (06/19/2016):  This is the current hospital active medication list Current Facility-Administered Medications  Medication Dose Route Frequency Provider Last Rate Last Dose  . acetaminophen (TYLENOL) tablet 650 mg  650 mg Oral Q6H PRN Toy Baker, MD       Or  . acetaminophen (  TYLENOL) suppository 650 mg  650 mg Rectal Q6H PRN Toy Baker, MD      . cefTRIAXone (ROCEPHIN) 1 g in dextrose 5 % 50 mL IVPB  1 g Intravenous Q24H Theodis Blaze, MD   1 g at 06/19/16 1152  . Darbepoetin Alfa (ARANESP) injection 100 mcg  100 mcg Subcutaneous Q Thu-1800 Corliss Parish, MD   100 mcg at 06/15/16 1847  . digoxin (LANOXIN) tablet 0.125 mg  0.125 mg Oral Q M,W,F Toy Baker, MD   0.125 mg at 06/19/16 1152  . feeding supplement (ENSURE ENLIVE)  (ENSURE ENLIVE) liquid 237 mL  237 mL Oral BID BM Theodis Blaze, MD   237 mL at 06/19/16 1400  . ferrous sulfate tablet 325 mg  325 mg Oral BID WC Theodis Blaze, MD   325 mg at 06/19/16 1109  . Gerhardt's butt cream 1 application  1 application Topical TID PRN Toy Baker, MD      . HYDROcodone-acetaminophen (NORCO/VICODIN) 5-325 MG per tablet 1-2 tablet  1-2 tablet Oral Q4H PRN Toy Baker, MD      . lactulose (CHRONULAC) 10 GM/15ML solution 30 g  30 g Oral BID Toy Baker, MD   30 g at 06/19/16 1109  . ondansetron (ZOFRAN) tablet 4 mg  4 mg Oral Q6H PRN Toy Baker, MD       Or  . ondansetron (ZOFRAN) injection 4 mg  4 mg Intravenous Q6H PRN Toy Baker, MD   4 mg at 06/17/16 1734  . pantoprazole (PROTONIX) EC tablet 40 mg  40 mg Oral Daily Toy Baker, MD   40 mg at 06/19/16 1109  . rifaximin (XIFAXAN) tablet 550 mg  550 mg Oral BID Toy Baker, MD   550 mg at 06/19/16 1110  . sodium chloride flush (NS) 0.9 % injection 3 mL  3 mL Intravenous Q12H Toy Baker, MD   3 mL at 06/19/16 1000  . spironolactone (ALDACTONE) tablet 50 mg  50 mg Oral Daily Toy Baker, MD   50 mg at 06/19/16 1152   And  . spironolactone (ALDACTONE) tablet 25 mg  25 mg Oral QHS Toy Baker, MD   25 mg at 06/18/16 2141  . torsemide (DEMADEX) tablet 100 mg  100 mg Oral Daily Rexene Agent, MD   100 mg at 06/19/16 1109     Discharge Medications: Please see discharge summary for a list of discharge medications.  Relevant Imaging Results:  Relevant Lab Results:   Additional Information SS#: [id number]  Candie Chroman, LCSW

## 2016-06-20 ENCOUNTER — Encounter: Payer: Self-pay | Admitting: Family Medicine

## 2016-06-20 LAB — BASIC METABOLIC PANEL
Anion gap: 10 (ref 5–15)
BUN: 81 mg/dL — AB (ref 6–20)
CHLORIDE: 86 mmol/L — AB (ref 101–111)
CO2: 26 mmol/L (ref 22–32)
CREATININE: 2.27 mg/dL — AB (ref 0.61–1.24)
Calcium: 8.8 mg/dL — ABNORMAL LOW (ref 8.9–10.3)
GFR calc Af Amer: 30 mL/min — ABNORMAL LOW (ref >60)
GFR calc non Af Amer: 26 mL/min — ABNORMAL LOW (ref >60)
Glucose, Bld: 91 mg/dL (ref 65–99)
Potassium: 4.2 mmol/L (ref 3.5–5.1)
SODIUM: 122 mmol/L — AB (ref 135–145)

## 2016-06-20 LAB — CBC
HEMATOCRIT: 23.1 % — AB (ref 39.0–52.0)
Hemoglobin: 7.6 g/dL — ABNORMAL LOW (ref 13.0–17.0)
MCH: 29.6 pg (ref 26.0–34.0)
MCHC: 32.9 g/dL (ref 30.0–36.0)
MCV: 89.9 fL (ref 78.0–100.0)
Platelets: 66 10*3/uL — ABNORMAL LOW (ref 150–400)
RBC: 2.57 MIL/uL — AB (ref 4.22–5.81)
RDW: 20.2 % — ABNORMAL HIGH (ref 11.5–15.5)
WBC: 5.8 10*3/uL (ref 4.0–10.5)

## 2016-06-20 MED ORDER — LACTULOSE 10 GM/15ML PO SOLN
10.0000 g | Freq: Two times a day (BID) | ORAL | Status: DC
  Administered 2016-06-20 – 2016-06-22 (×4): 10 g via ORAL
  Filled 2016-06-20 (×4): qty 15

## 2016-06-20 NOTE — Progress Notes (Signed)
Patient ID: Hunter [name], male   DOB: Nov 11, 1935, 81 y.o.   MRN: [id number]    PROGRESS NOTE    Hunter [name]  [name] DOB: 1935/10/11 DOA: 06/14/2016  PCP: Penni Homans, MD   Brief Narrative:  81 y.o. male with known systolic and diastolic HF Echo Q000111Q EF 45-50% NYHA class III, pulmonary hypertension, liver failure, CKD stage III, and hereditary hemorrhagic telangiectasia with hepatic AVMs and associated anemia requiring transfusion, now presented to [facility] ED with main concern of several days duration of progressively worsening dyspnea with exertion and at rest. Admission labs notable for Troponin elevated but flat (0.08 -> 0.07 -> 0.08). UA with many bacteria and Large Hgb, moderate leukocytes and protein. CXR with pulmonary vascular congestion, ? asymmetric edema versus infection.   Cardiology and nephrology teams consulted, Nephrology team, placed on Lasix 120 mg IV and weight is down 8 lbs.   Assessment & Plan:   Active Problems:   Acute on chronic combined systolic and diastolic CHF, NYHA class III - last ECHO in 2016 with EF 45% and appears unchanged based on repeat ECHO on this admission - weight is down from admission: 182 --> 174 --> 171 --> 175 --> 170 --> 176 lbs this AM - was on lasix 80 mg IV BID, changed to Torsemide 100 mg PO QD 7/8 - pt clinically stable this AM  - appreciate nephrology team following  - cardiology team signed off as there are no further recommendations     Acute on chronic kidney disease stage III - IV - with GFR in 30-40's and baseline Cr 2 - 2.2 - now up likely in the setting of volume overload - Cr is trending down from 3.4 --> 3.1 --> 2.82 --> 2.68 --> 2.43 --> 2.2 - continue with Torsemide as outline above  - per nephrology     Hepatic encephalopathy - continue rifaximin and lactulose  - pt still multiple loose stools, will lower the dose of lactulose     Hyponatremia - likely secondary to effects of ADH in the setting of liver  cirrhosis  - Na trending down, may likely be new baseline  - BMP in AM    Chronic thrombocytopenia  - in the setting of liver cirrhosis - diffuse petechiae noted, overall stable  - CBC in AM    UTI with hematuria - completed 5 days therapy with Rocephin     HHT (hereditary hemorrhagic telangiectasia) (HCC)    Anemia due to chronic blood loss - Fe def - per nephrology team      Buttock wound, right  - Pressure Ulcer stage 3 pressure injury present on admission  - Flexiseal to attempt to contain, but still leaks around the insertion site and it is difficult to keep the dressing over the buttock wound from becoming soiled.   DVT prophylaxis: SCD's Code Status: DNR Family Communication: Patient at bedside, wife at bedside  Disposition Plan: SNF in 1-2 days, family in agreement   Consultants:   Cardiology   Nephrology   Procedures:   None  Antimicrobials:   Rocephin 7/6 --> 7/10  Subjective: Reports feeling tired.   Objective: Filed Vitals:   06/19/16 2128 06/20/16 0601 06/20/16 1108 06/20/16 1112  BP: 107/51 103/50  96/51  Pulse: 78 76  79  Temp: 97.7 F (36.5 C) 97.8 F (36.6 C)  97.7 F (36.5 C)  TempSrc: Oral Oral  Oral  Resp: 18 18  18   Height:      Weight:  79.833 kg (176 lb)   SpO2: 98% 98%  100%    Intake/Output Summary (Last 24 hours) at 06/20/16 1651 Last data filed at 06/20/16 1115  Gross per 24 hour  Intake    840 ml  Output   1625 ml  Net   -785 ml   Filed Weights   06/18/16 0609 06/19/16 0458 06/20/16 1108  Weight: 79.516 kg (175 lb 4.8 oz) 77.111 kg (170 lb) 79.833 kg (176 lb)    Examination:  General exam: Appears more alert, follows commands appropriately  Respiratory system: Respiratory effort normal. Diminished at bases  Cardiovascular system: S1 & S2 heard, RRR. No JVD, rubs, gallops or clicks. SEM 2/6 Gastrointestinal system: Abdomen is nondistended, soft and nontender. No organomegaly or masses felt.  Central nervous  system: Follows commands appropriately  Skin: multiple petechiae   Data Reviewed: I have personally reviewed following labs and imaging studies  CBC:  Recent Labs Lab 06/14/16 1640 06/15/16 0714 06/16/16 0408 06/17/16 0430 06/19/16 0425 06/20/16 0355  WBC 5.7 4.7 4.4 4.8 7.1 5.8  NEUTROABS 4.5  --   --   --   --   --   HGB 8.0* 7.6* 7.6* 7.4* 7.8* 7.6*  HCT 25.0* 23.3* 23.1* 22.9* 24.3* 23.1*  MCV 88.0 87.3 87.5 87.7 89.0 89.9  PLT 72* 59* 55* 52* 64* 66*   Basic Metabolic Panel:  Recent Labs Lab 06/15/16 0714 06/16/16 0408 06/17/16 0430 06/18/16 0946 06/19/16 0425 06/20/16 0355  NA 125* 128* 126* 125* 123* 122*  K 5.1 3.9 3.7 3.4* 4.0 4.2  CL 95* 93* 91* 89* 86* 86*  CO2 22 25 23 25 25 26   GLUCOSE 88 90 96 180* 101* 91  BUN 81* 85* 84* 81* 80* 81*  CREATININE 3.44* 3.18* 2.83* 2.68* 2.43* 2.27*  CALCIUM 9.0 8.9 8.7* 8.8* 8.9 8.8*  MG 3.0*  --   --   --   --   --   PHOS 5.8* 5.5*  --   --   --   --    Liver Function Tests:  Recent Labs Lab 06/14/16 1741 06/15/16 0714 06/16/16 0408  AST 41 35  --   ALT 23 22  --   ALKPHOS 165* 154*  --   BILITOT 2.9* 2.7*  --   PROT 5.3* 5.1*  --   ALBUMIN 2.8* 2.5* 2.6*    Recent Labs Lab 06/14/16 1713  AMMONIA 118*   Cardiac Enzymes:  Recent Labs Lab 06/14/16 1943 06/15/16 0136 06/15/16 0714  TROPONINI 0.08* 0.07* 0.08*   BNP (last 3 results)  Recent Labs  08/09/15 1621  PROBNP 188.0*   Urine analysis:    Component Value Date/Time   COLORURINE YELLOW 06/15/2016 [place] 06/15/2016 0721   LABSPEC 1.011 06/15/2016 0721   PHURINE 6.5 06/15/2016 0721   GLUCOSEU NEGATIVE 06/15/2016 0721   HGBUR LARGE* 06/15/2016 0721   BILIRUBINUR NEGATIVE 06/15/2016 0721   KETONESUR NEGATIVE 06/15/2016 0721   PROTEINUR 100* 06/15/2016 0721   UROBILINOGEN 0.2 08/10/2015 1700   NITRITE NEGATIVE 06/15/2016 0721   LEUKOCYTESUR MODERATE* 06/15/2016 0721   Radiology Studies: No results  found.  Scheduled Meds: . cefTRIAXone (ROCEPHIN)  IV  1 g Intravenous Q24H  . darbepoetin (ARANESP) injection - NON-DIALYSIS  100 mcg Subcutaneous Q Thu-1800  . digoxin  0.125 mg Oral Q M,W,F  . feeding supplement (ENSURE ENLIVE)  237 mL Oral BID BM  . ferrous sulfate  325 mg Oral BID WC  .  lactulose  10 g Oral BID  . pantoprazole  40 mg Oral Daily  . rifaximin  550 mg Oral BID  . sodium chloride flush  3 mL Intravenous Q12H  . spironolactone  50 mg Oral Daily   And  . spironolactone  25 mg Oral QHS  . torsemide  100 mg Oral Daily   Continuous Infusions:    LOS: 6 days    Time spent: 20 minutes    Faye Ramsay, MD [facility] Pager [phone]  If 7PM-7AM, please contact night-coverage [email or web address] Password [facility] 06/20/2016, 4:51 PM

## 2016-06-20 NOTE — Progress Notes (Signed)
Flexi seal ,rectal tube inserted. Pt tolerated procedure well

## 2016-06-20 NOTE — Clinical Social Work Note (Signed)
Both preference SNF's declined. Patient's wife gave permission to fax out to other facilities.  [place]ton Scrape, [place]

## 2016-06-20 NOTE — Progress Notes (Signed)
Physical Therapy Treatment Patient Details Name: Hunter [name] MRN: [id number] DOB: [birth year] Today's Date: 06/20/2016    History of Present Illness Pt is a 81 y/o M who presented with SOB.  Pt's PMH inlcudes recurrent epistaxis, chronic anemia due to chronic blood loss (recently admitted secondary to vomiting bright red blood), hepatic AVM, HF, s/p pacemaker, recurrent hepatic encephalopathy, CKD.  Pt with UTI, CXR with pulmonary vascular congestion.    PT Comments    Hunter [name] made good progress today.  He requires mod assist to steady and to manage RW while ambulating. Pt demonstrates Rt partial knee buckle while ambulating and poor safety awareness.  Pt will benefit from continued skilled PT services to increase functional independence and safety.   Follow Up Recommendations  SNF;Supervision/Assistance - 24 hour     Equipment Recommendations  Other (comment) (TBD at next venue of care)    Recommendations for Other Services OT consult     Precautions / Restrictions Precautions Precautions: Fall Restrictions Weight Bearing Restrictions: No    Mobility  Bed Mobility Overal bed mobility: Needs Assistance Bed Mobility: Supine to Sit     Supine to sit: Min guard;HOB elevated     General bed mobility comments: Increased time and effort  Transfers Overall transfer level: Needs assistance Equipment used: Rolling walker (2 wheeled) Transfers: Sit to/from Stand Sit to Stand: Min assist         General transfer comment: Assist to steady and cues for hand placement  Ambulation/Gait Ambulation/Gait assistance: Mod assist;+2 safety/equipment Ambulation Distance (Feet): 100 Feet Assistive device: Rolling walker (2 wheeled) Gait Pattern/deviations: Step-through pattern;Decreased stride length;Trunk flexed   Gait velocity interpretation: at or above normal speed for age/gender General Gait Details: Light mod assist to steady and to manage RW as pt bumping into objects  in room and hall.  Rt partial knee buckle without improvement following verbal cues for quad activation and maintaining knee extension.     Stairs            Wheelchair Mobility    Modified Rankin (Stroke Patients Only)       Balance Overall balance assessment: Needs assistance Sitting-balance support: Single extremity supported;Feet supported Sitting balance-Leahy Scale: Poor Sitting balance - Comments: Relies on UE support while sitting EOB with posterior lean Postural control: Posterior lean Standing balance support: Bilateral upper extremity supported;During functional activity Standing balance-Leahy Scale: Poor Standing balance comment: Relies on UE support                    Cognition Arousal/Alertness: Awake/alert Behavior During Therapy: WFL for tasks assessed/performed Overall Cognitive Status: Impaired/Different from baseline Area of Impairment: Safety/judgement;Problem solving;Awareness         Safety/Judgement: Decreased awareness of safety;Decreased awareness of deficits Awareness: Emergent Problem Solving: Slow processing General Comments: Pt impulsive at times as need cues to redirect when bumping into objects in room and hall    Exercises General Exercises - Upper Extremity Shoulder Flexion: Both;10 reps;Seated General Exercises - Lower Extremity Straight Leg Raises: Both;10 reps;Seated    General Comments        Pertinent Vitals/Pain Pain Assessment: No/denies pain Pain Intervention(s): Limited activity within patient's tolerance;Monitored during session    Home Living                      Prior Function            PT Goals (current goals can now be found in the care plan section) Acute  Rehab PT Goals Patient Stated Goal: to go to rehab before home PT Goal Formulation: With patient Time For Goal Achievement: 07/02/16 Potential to Achieve Goals: Good Progress towards PT goals: Progressing toward goals     Frequency  Min 2X/week    PT Plan Current plan remains appropriate    Co-evaluation             End of Session Equipment Utilized During Treatment: Gait belt Activity Tolerance: Patient limited by fatigue;Patient tolerated treatment well Patient left: in chair;with call bell/phone within reach;with chair alarm set;Other (comment);with family/visitor present (with pillow in seat for comfort )     Time: [id number] PT Time Calculation (min) (ACUTE ONLY): 23 min  Charges:  $Gait Training: 8-22 mins $Therapeutic Exercise: 8-22 mins                    G Codes:      Collie Siad PT, DPT  Pager: [phone] Phone: [phone] 06/20/2016, 2:38 PM

## 2016-06-20 NOTE — Progress Notes (Signed)
Admit: 06/14/2016 LOS: 6  74M with cirrhosis, sCHF, admit with AoCKD + hypervolemia + hyperkalemia  Subjective:  No new events Na stable but low Good UOP No AM Weight yet  07/10 0701 - 07/11 0700 In: 1080 [P.O.:1080] Out: 1750 [Urine:1750]  Filed Weights   06/17/16 0505 06/18/16 0609 06/19/16 0458  Weight: 79.379 kg (175 lb) 79.516 kg (175 lb 4.8 oz) 77.111 kg (170 lb)    Scheduled Meds: . cefTRIAXone (ROCEPHIN)  IV  1 g Intravenous Q24H  . darbepoetin (ARANESP) injection - NON-DIALYSIS  100 mcg Subcutaneous Q Thu-1800  . digoxin  0.125 mg Oral Q M,W,F  . feeding supplement (ENSURE ENLIVE)  237 mL Oral BID BM  . ferrous sulfate  325 mg Oral BID WC  . lactulose  30 g Oral BID  . pantoprazole  40 mg Oral Daily  . rifaximin  550 mg Oral BID  . sodium chloride flush  3 mL Intravenous Q12H  . spironolactone  50 mg Oral Daily   And  . spironolactone  25 mg Oral QHS  . torsemide  100 mg Oral Daily   Continuous Infusions:  PRN Meds:.acetaminophen **OR** acetaminophen, Gerhardt's butt cream, HYDROcodone-acetaminophen, ondansetron **OR** ondansetron (ZOFRAN) IV  Current Labs: reviewed 7/7: TSAT 6%; Ferritin 35, Hb 7.6 MCV 87  Physical Exam:  Blood pressure 103/50, pulse 76, temperature 97.8 F (36.6 C), temperature source Oral, resp. rate 18, height 6' (1.829 m), weight 77.111 kg (170 lb), SpO2 98 %. NAD, resting and awakens RRR nl s1s2 Foley in place Distended, soft Nonfocal  A 1. AoCKD3; improving and back to baseline 1. BL SCr 2.1-2.3 2. sCHF with Hypervolemia; improving 3. Cirrhosis; on lactulose 4. HHT 5. Recurrent GIB 6. Chronic Anemia with Fe deficiency -- s/p feraheme 510mg  IV 7/7 and on ESA 7. Hyponatremia, hypervolemic 8. Hyperkalemia, improved; doesn't need chronic resin 9. UTI on Ceftriaxone; U Cx NGF  P 1. Cont PO diuretics at current dosing 2. Cont 1L Fluid restriction given hyponatremia / vol issues 3. PT/OT 4. Overall very poor prognosis with CHF,  CKD, liver disease, hyponatremia; might want to have ongoing direct [place] conversations    Pearson Grippe MD 06/20/2016, 8:17 AM   Recent Labs Lab 06/15/16 0714 06/16/16 0408  06/18/16 0946 06/19/16 0425 06/20/16 0355  NA 125* 128*  < > 125* 123* 122*  K 5.1 3.9  < > 3.4* 4.0 4.2  CL 95* 93*  < > 89* 86* 86*  CO2 22 25  < > 25 25 26   GLUCOSE 88 90  < > 180* 101* 91  BUN 81* 85*  < > 81* 80* 81*  CREATININE 3.44* 3.18*  < > 2.68* 2.43* 2.27*  CALCIUM 9.0 8.9  < > 8.8* 8.9 8.8*  PHOS 5.8* 5.5*  --   --   --   --   < > = values in this interval not displayed.  Recent Labs Lab 06/14/16 1640  06/17/16 0430 06/19/16 0425 06/20/16 0355  WBC 5.7  < > 4.8 7.1 5.8  NEUTROABS 4.5  --   --   --   --   HGB 8.0*  < > 7.4* 7.8* 7.6*  HCT 25.0*  < > 22.9* 24.3* 23.1*  MCV 88.0  < > 87.7 89.0 89.9  PLT 72*  < > 52* 64* 66*  < > = values in this interval not displayed.

## 2016-06-21 LAB — CBC
HCT: 23.7 % — ABNORMAL LOW (ref 39.0–52.0)
Hemoglobin: 7.6 g/dL — ABNORMAL LOW (ref 13.0–17.0)
MCH: 29.2 pg (ref 26.0–34.0)
MCHC: 32.1 g/dL (ref 30.0–36.0)
MCV: 91.2 fL (ref 78.0–100.0)
PLATELETS: 82 10*3/uL — AB (ref 150–400)
RBC: 2.6 MIL/uL — AB (ref 4.22–5.81)
RDW: 21.2 % — AB (ref 11.5–15.5)
WBC: 6.7 10*3/uL (ref 4.0–10.5)

## 2016-06-21 LAB — BASIC METABOLIC PANEL
Anion gap: 10 (ref 5–15)
BUN: 85 mg/dL — ABNORMAL HIGH (ref 6–20)
CALCIUM: 8.8 mg/dL — AB (ref 8.9–10.3)
CO2: 27 mmol/L (ref 22–32)
CREATININE: 2.28 mg/dL — AB (ref 0.61–1.24)
Chloride: 86 mmol/L — ABNORMAL LOW (ref 101–111)
GFR calc non Af Amer: 26 mL/min — ABNORMAL LOW (ref >60)
GFR, EST AFRICAN AMERICAN: 30 mL/min — AB (ref >60)
Glucose, Bld: 103 mg/dL — ABNORMAL HIGH (ref 65–99)
Potassium: 4.6 mmol/L (ref 3.5–5.1)
SODIUM: 123 mmol/L — AB (ref 135–145)

## 2016-06-21 NOTE — Progress Notes (Signed)
Patient ID: Hunter [name], male   DOB: 1935/06/27, 81 y.o.   MRN: [id number]    PROGRESS NOTE    Hunter [name]  [name] DOB: [birth year] DOA: 06/14/2016  PCP: Penni Homans, MD   Brief Narrative:  81 y.o. male with known systolic and diastolic HF Echo [id number] EF 45-50% NYHA class III, pulmonary hypertension, liver failure, CKD stage III, and hereditary hemorrhagic telangiectasia with hepatic AVMs and associated anemia requiring transfusion, now presented to [facility] ED with main concern of several days duration of progressively worsening dyspnea with exertion and at rest. Admission labs notable for Troponin elevated but flat (0.08 -> 0.07 -> 0.08). UA with many bacteria and Large Hgb, moderate leukocytes and protein. CXR with pulmonary vascular congestion, ? asymmetric edema versus infection.   Cardiology and nephrology teams consulted, Nephrology team, placed on Lasix 120 mg IV and weight is down 8 lbs.   Assessment & Plan:   Active Problems:   Acute on chronic combined systolic and diastolic CHF, NYHA class III - last ECHO in 2016 with EF 45% and appears unchanged based on repeat ECHO on this admission - weight trend since admission: 182 --> 174 --> 171 --> 175 --> 170 --> 176 --> 177 lbs this AM - was on lasix 80 mg IV BID, changed to Torsemide 100 mg PO QD 7/8 - pt looks more tired on exam this AM - appreciate nephrology team following  - cardiology team signed off as there are no further recommendations     Acute on chronic kidney disease stage III - IV - with GFR in 30-40's and baseline Cr 2 - 2.2 - now up likely in the setting of volume overload - Cr is trending down from 3.4 --> 3.1 --> 2.82 --> 2.68 --> 2.43 --> 2.2, stable in the past 24 hours  - continue with Torsemide as outline above  - per nephrology     Hepatic encephalopathy - continue rifaximin and lactulose  - pt still multiple loose stools, I have already lowered the dose and frequency of lactulose - pt more  somnolent this am, will monitor him closely for additional 24 hours     Hyponatremia - likely secondary to effects of ADH in the setting of liver cirrhosis  - may likely be new baseline  - BMP in AM    Chronic thrombocytopenia  - in the setting of liver cirrhosis - diffuse petechiae noted, overall stable  - CBC in AM    UTI with hematuria - completed 5 days therapy with Rocephin     HHT (hereditary hemorrhagic telangiectasia) ([place])    Anemia due to chronic blood loss - Fe def - per nephrology team      Buttock wound, right  - Pressure Ulcer stage 3 pressure injury present on admission  - Flexiseal to attempt to contain, but still leaks around the insertion site and it is difficult to keep the dressing over the buttock wound from becoming soiled.   DVT prophylaxis: SCD's Code Status: DNR Family Communication: Patient at bedside, wife at bedside  Disposition Plan: SNF in 1-2 days, family in agreement, provided pt more alert   Consultants:   Cardiology   Nephrology   Procedures:   None  Antimicrobials:   Rocephin 7/6 --> 7/10  Subjective: Reports feeling tired.   Objective: Filed Vitals:   06/20/16 1112 06/20/16 1952 06/21/16 0507 06/21/16 0949  BP: 96/51 111/50 100/45   Pulse: 79 79 85 80  Temp: 97.7 F (36.5 C) 98.4 F (  36.9 C) 98.1 F (36.7 C)   TempSrc: Oral Oral Oral   Resp: 18 18 18    Height:      Weight:   80.287 kg (177 lb)   SpO2: 100% 97% 97%     Intake/Output Summary (Last 24 hours) at 06/21/16 1120 Last data filed at 06/21/16 1046  Gross per 24 hour  Intake    680 ml  Output   1900 ml  Net  -1220 ml   Filed Weights   06/19/16 0458 06/20/16 1108 06/21/16 0507  Weight: 77.111 kg (170 lb) 79.833 kg (176 lb) 80.287 kg (177 lb)    Examination:  General exam: Appears somnolent, unable to stay awake for more than few seconds  Respiratory system: Respiratory effort normal. Diminished at bases  Cardiovascular system: S1 & S2 heard, RRR.  No JVD, rubs, gallops or clicks. SEM 2/6 Gastrointestinal system: Abdomen is nondistended, soft and nontender. No organomegaly or masses felt.  Central nervous system: moving all 4 extremities spontaneously but rather somnolent  Skin: multiple petechiae   Data Reviewed: I have personally reviewed following labs and imaging studies  CBC:  Recent Labs Lab 06/14/16 1640  06/16/16 0408 06/17/16 0430 06/19/16 0425 06/20/16 0355 06/21/16 0244  WBC 5.7  < > 4.4 4.8 7.1 5.8 6.7  NEUTROABS 4.5  --   --   --   --   --   --   HGB 8.0*  < > 7.6* 7.4* 7.8* 7.6* 7.6*  HCT 25.0*  < > 23.1* 22.9* 24.3* 23.1* 23.7*  MCV 88.0  < > 87.5 87.7 89.0 89.9 91.2  PLT 72*  < > 55* 52* 64* 66* 82*  < > = values in this interval not displayed. Basic Metabolic Panel:  Recent Labs Lab 06/15/16 0714 06/16/16 0408 06/17/16 0430 06/18/16 0946 06/19/16 0425 06/20/16 0355 06/21/16 0244  NA 125* 128* 126* 125* 123* 122* 123*  K 5.1 3.9 3.7 3.4* 4.0 4.2 4.6  CL 95* 93* 91* 89* 86* 86* 86*  CO2 22 25 23 25 25 26 27   GLUCOSE 88 90 96 180* 101* 91 103*  BUN 81* 85* 84* 81* 80* 81* 85*  CREATININE 3.44* 3.18* 2.83* 2.68* 2.43* 2.27* 2.28*  CALCIUM 9.0 8.9 8.7* 8.8* 8.9 8.8* 8.8*  MG 3.0*  --   --   --   --   --   --   PHOS 5.8* 5.5*  --   --   --   --   --    Liver Function Tests:  Recent Labs Lab 06/14/16 1741 06/15/16 0714 06/16/16 0408  AST 41 35  --   ALT 23 22  --   ALKPHOS 165* 154*  --   BILITOT 2.9* 2.7*  --   PROT 5.3* 5.1*  --   ALBUMIN 2.8* 2.5* 2.6*    Recent Labs Lab 06/14/16 1713  AMMONIA 118*   Cardiac Enzymes:  Recent Labs Lab 06/14/16 1943 06/15/16 0136 06/15/16 0714  TROPONINI 0.08* 0.07* 0.08*   BNP (last 3 results)  Recent Labs  08/09/15 1621  PROBNP 188.0*   Urine analysis:    Component Value Date/Time   COLORURINE YELLOW 06/15/2016 0721   APPEARANCEUR CLEAR 06/15/2016 0721   LABSPEC 1.011 06/15/2016 0721   PHURINE 6.5 06/15/2016 0721   GLUCOSEU  NEGATIVE 06/15/2016 0721   HGBUR LARGE* 06/15/2016 0721   BILIRUBINUR NEGATIVE 06/15/2016 0721   KETONESUR NEGATIVE 06/15/2016 0721   PROTEINUR 100* 06/15/2016 0721   UROBILINOGEN 0.2 08/10/2015  [place] 06/15/2016 0721   LEUKOCYTESUR MODERATE* 06/15/2016 [id number]   Radiology Studies: No results found.  Scheduled Meds: . cefTRIAXone (ROCEPHIN)  IV  1 g Intravenous Q24H  . darbepoetin (ARANESP) injection - NON-DIALYSIS  100 mcg Subcutaneous Q Thu-1800  . digoxin  0.125 mg Oral Q M,W,F  . feeding supplement (ENSURE ENLIVE)  237 mL Oral BID BM  . ferrous sulfate  325 mg Oral BID WC  . lactulose  10 g Oral BID  . pantoprazole  40 mg Oral Daily  . rifaximin  550 mg Oral BID  . sodium chloride flush  3 mL Intravenous Q12H  . spironolactone  50 mg Oral Daily   And  . spironolactone  25 mg Oral QHS  . torsemide  100 mg Oral Daily   Continuous Infusions:    LOS: 7 days    Time spent: 20 minutes    Faye Ramsay, MD [facility] Pager [phone]  If 7PM-7AM, please contact night-coverage [email or web address] Password [name] 06/21/2016, 11:20 AM

## 2016-06-21 NOTE — Progress Notes (Signed)
Changed dressing per order. Will continue to monitor.

## 2016-06-21 NOTE — Clinical Social Work Note (Addendum)
CSW presented bed offers to patient's wife. She was not familiar with any of the facilities but said she would look into them.   Dayton Scrape, CSW [phone]  3:15 pm CSW provided updated list of bed offers to patient's wife. She has not found any that she liked yet. She asked that CSW contact Dustin Flock. CSW called and left voicemail with admissions coordinator, [employer]. Answer still pending with them over the hub.  Dayton Scrape, [place]

## 2016-06-21 NOTE — Progress Notes (Signed)
Admit: 06/14/2016 LOS: 7  49M with cirrhosis, sCHF, admit with AoCKD + hypervolemia + hyperkalemia  Subjective:  No new events Ambulated in halls some yesterday Plan for SNF Stable Na and GFR  07/11 0701 - 07/12 0700 In: 680 [P.O.:480; IV Piggyback:200] Out: 1825 [Urine:1825]  Filed Weights   06/19/16 0458 06/20/16 1108 06/21/16 0507  Weight: 77.111 kg (170 lb) 79.833 kg (176 lb) 80.287 kg (177 lb)    Scheduled Meds: . cefTRIAXone (ROCEPHIN)  IV  1 g Intravenous Q24H  . darbepoetin (ARANESP) injection - NON-DIALYSIS  100 mcg Subcutaneous Q Thu-1800  . digoxin  0.125 mg Oral Q M,W,F  . feeding supplement (ENSURE ENLIVE)  237 mL Oral BID BM  . ferrous sulfate  325 mg Oral BID WC  . lactulose  10 g Oral BID  . pantoprazole  40 mg Oral Daily  . rifaximin  550 mg Oral BID  . sodium chloride flush  3 mL Intravenous Q12H  . spironolactone  50 mg Oral Daily   And  . spironolactone  25 mg Oral QHS  . torsemide  100 mg Oral Daily   Continuous Infusions:  PRN Meds:.acetaminophen **OR** acetaminophen, Gerhardt's butt cream, HYDROcodone-acetaminophen, ondansetron **OR** ondansetron (ZOFRAN) IV  Current Labs: reviewed 7/7: TSAT 6%; Ferritin 35, Hb 7.6 MCV 87  Physical Exam:  Blood pressure 100/45, pulse 80, temperature 98.1 F (36.7 C), temperature source Oral, resp. rate 18, height 6' (1.829 m), weight 80.287 kg (177 lb), SpO2 97 %. NAD, resting and awakens RRR nl s1s2 Foley in place Distended, soft Nonfocal  A 1. AoCKD3; improving and back to baseline 1. BL SCr 2.1-2.3 2. sCHF with Hypervolemia; improving 3. Cirrhosis; on lactulose 4. HHT 5. Recurrent GIB 6. Chronic Anemia with Fe deficiency -- s/p feraheme 510mg  IV 7/7 and on ESA 7. Hyponatremia, hypervolemic 8. Hyperkalemia, improved; doesn't need chronic resin 9. UTI on Ceftriaxone; U Cx NGF  P 1. Cont PO diuretics at current dosing 2. Cont 1L Fluid restriction given hyponatremia / vol issues 3. PT/OT 4. Will  arrange labs from SNF and f/u appt with me 5. Overall very poor prognosis with CHF, CKD, liver disease, hyponatremia; cont to discuss with wife who wants to see if he can strengthen in SNF  Will sign off for now.  Please call with any questions or concerns.  Pt does need follow up with nephrology and I will arrange to see me in clinic.      Pearson Grippe MD 06/21/2016, 10:22 AM   Recent Labs Lab 06/15/16 [id number] 06/16/16 0408  06/19/16 0425 06/20/16 0355 06/21/16 0244  NA 125* 128*  < > 123* 122* 123*  K 5.1 3.9  < > 4.0 4.2 4.6  CL 95* 93*  < > 86* 86* 86*  CO2 22 25  < > 25 26 27   GLUCOSE 88 90  < > 101* 91 103*  BUN 81* 85*  < > 80* 81* 85*  CREATININE 3.44* 3.18*  < > 2.43* 2.27* 2.28*  CALCIUM 9.0 8.9  < > 8.9 8.8* 8.8*  PHOS 5.8* 5.5*  --   --   --   --   < > = values in this interval not displayed.  Recent Labs Lab 06/14/16 1640  06/19/16 0425 06/20/16 0355 06/21/16 0244  WBC 5.7  < > 7.1 5.8 6.7  NEUTROABS 4.5  --   --   --   --   HGB 8.0*  < > 7.8* 7.6* 7.6*  HCT 25.0*  < >  24.3* 23.1* 23.7*  MCV 88.0  < > 89.0 89.9 91.2  PLT 72*  < > 64* 66* 82*  < > = values in this interval not displayed.

## 2016-06-22 ENCOUNTER — Ambulatory Visit: Payer: Medicare Other | Admitting: Family Medicine

## 2016-06-22 LAB — CBC
HCT: 23.8 % — ABNORMAL LOW (ref 39.0–52.0)
Hemoglobin: 7.7 g/dL — ABNORMAL LOW (ref 13.0–17.0)
MCH: 29.8 pg (ref 26.0–34.0)
MCHC: 32.4 g/dL (ref 30.0–36.0)
MCV: 92.2 fL (ref 78.0–100.0)
PLATELETS: 69 10*3/uL — AB (ref 150–400)
RBC: 2.58 MIL/uL — AB (ref 4.22–5.81)
RDW: 22.8 % — ABNORMAL HIGH (ref 11.5–15.5)
WBC: 5.9 10*3/uL (ref 4.0–10.5)

## 2016-06-22 LAB — RENAL FUNCTION PANEL
Albumin: 2.5 g/dL — ABNORMAL LOW (ref 3.5–5.0)
Anion gap: 10 (ref 5–15)
BUN: 86 mg/dL — ABNORMAL HIGH (ref 6–20)
CHLORIDE: 88 mmol/L — AB (ref 101–111)
CO2: 27 mmol/L (ref 22–32)
CREATININE: 1.99 mg/dL — AB (ref 0.61–1.24)
Calcium: 8.8 mg/dL — ABNORMAL LOW (ref 8.9–10.3)
GFR calc non Af Amer: 30 mL/min — ABNORMAL LOW (ref >60)
GFR, EST AFRICAN AMERICAN: 35 mL/min — AB (ref >60)
Glucose, Bld: 94 mg/dL (ref 65–99)
Phosphorus: 2.7 mg/dL (ref 2.5–4.6)
Potassium: 4.6 mmol/L (ref 3.5–5.1)
Sodium: 125 mmol/L — ABNORMAL LOW (ref 135–145)

## 2016-06-22 MED ORDER — TORSEMIDE 100 MG PO TABS: 100.0000 mg | ORAL_TABLET | Freq: Every day | ORAL | Status: AC

## 2016-06-22 MED ORDER — LACTULOSE 10 GM/15ML PO SOLN
15.0000 g | Freq: Two times a day (BID) | ORAL | Status: DC
  Administered 2016-06-22 – 2016-06-23 (×2): 15 g via ORAL
  Filled 2016-06-22 (×2): qty 30

## 2016-06-22 MED ORDER — TAMSULOSIN HCL 0.4 MG PO CAPS
0.4000 mg | ORAL_CAPSULE | Freq: Every day | ORAL | Status: DC
  Administered 2016-06-22 – 2016-06-28 (×5): 0.4 mg via ORAL
  Filled 2016-06-22 (×6): qty 1

## 2016-06-22 NOTE — Clinical Social Work Placement (Signed)
   CLINICAL SOCIAL WORK PLACEMENT  NOTE  Date:  06/22/2016  Patient Details  Name: Hunter [name] MRN: [id number] Date of Birth: 04-23-1935  Clinical Social Work is seeking post-discharge placement for this patient at the [place] level of care (*CSW will initial, date and re-position this form in  chart as items are completed):  Yes   Patient/family provided with [place] Work Department's list of facilities offering this level of care within the geographic area requested by the patient (or if unable, by the patient's family).  Yes   Patient/family informed of their freedom to choose among providers that offer the needed level of care, that participate in Medicare, Medicaid or managed care program needed by the patient, have an available bed and are willing to accept the patient.  Yes   Patient/family informed of [place]'s ownership interest in [facility] and [facility], as well as of the fact that they are under no obligation to receive care at these facilities.  PASRR submitted to EDS on 06/19/16     PASRR number received on 06/19/16     Existing PASRR number confirmed on       FL2 transmitted to all facilities in geographic area requested by pt/family on 06/19/16     FL2 transmitted to all facilities within larger geographic area on       Patient informed that his/her managed care company has contracts with or will negotiate with certain facilities, including the following:        Yes   Patient/family informed of bed offers received.  Patient chooses bed at [facility]     Physician recommends and patient chooses bed at      Patient to be transferred to [facility] on  .  Patient to be transferred to facility by       Patient family notified on   of transfer.  Name of family member notified:        PHYSICIAN Please sign DNR     Additional Comment:    _______________________________________________ Candie Chroman,  LCSW 06/22/2016, 11:50 AM

## 2016-06-22 NOTE — Progress Notes (Signed)
Preformed in/out cath number 2. Patient tolerated procedure well. 750cc out. Will continue to monitor.

## 2016-06-22 NOTE — Progress Notes (Signed)
Pt unable to void. C/o of bladder discomfort. Bladder scan done ,noted 256ml. [facility] paged for management.

## 2016-06-22 NOTE — Progress Notes (Signed)
Preformed straight cath number 3. Patient tolerated procedure well. 650cc out. Will continue to monitor.

## 2016-06-22 NOTE — Discharge Summary (Signed)
Physician Discharge Summary  Hunter [name] [name] DOB: [birth year] DOA: 06/14/2016  PCP: Penni Homans, MD  Admit date: 06/14/2016 Discharge date: 06/23/2016  Recommendations for Outpatient Follow-up:  1. Pt will need to follow up with PCP in 1-2 weeks post discharge 2. Please obtain BMP to evaluate electrolytes and kidney function 3. Please also check CBC to evaluate Hg and Hct levels  Discharge Diagnoses:  Active Problems:   HHT (hereditary hemorrhagic telangiectasia) (HCC)   Anemia due to chronic blood loss   Cardiac cirrhosis   Hyponatremia   Acute on chronic renal failure (HCC)   Hyperkalemia   Acute on chronic diastolic (congestive) heart failure (HCC)   CHF (congestive heart failure) (HCC)   Acute diastolic CHF (congestive heart failure) (HCC)   Pressure ulcer   Acute renal failure (ARF) ([place])   Discharge Condition: Stable  Diet recommendation: Heart healthy diet discussed in details   Brief Narrative:  81 y.o. male with known systolic and diastolic HF Echo [id number] EF 45-50% NYHA class III, pulmonary hypertension, liver failure, CKD stage III, and hereditary hemorrhagic telangiectasia with hepatic AVMs and associated anemia requiring transfusion, now presented to [facility] ED with main concern of several days duration of progressively worsening dyspnea with exertion and at rest. Admission labs notable for Troponin elevated but flat (0.08 -> 0.07 -> 0.08). UA with many bacteria and Large Hgb, moderate leukocytes and protein. CXR with pulmonary vascular congestion, ? asymmetric edema versus infection.   Cardiology and nephrology teams consulted, Nephrology team, placed on Lasix 120 mg IV and weight is down 8 lbs.   Assessment & Plan:  Active Problems:  Acute on chronic combined systolic and diastolic CHF, NYHA class III - last ECHO in 2016 with EF 45% and appears unchanged based on repeat ECHO on this admission - weight trend since admission: 182 --> 174 --> 171 --> 175  --> 170 --> 176  --> 168 lbs this AM - was on lasix 80 mg IV BID, changed to Torsemide 100 mg PO QD 7/8 - pt looks more tired on exam this AM - appreciate nephrology team following  - cardiology team signed off as there are no further recommendations    Acute on chronic kidney disease stage III - IV - with GFR in 30-40's and baseline Cr 2 - 2.2 - now up likely in the setting of volume overload - Cr is trending down from 3.4 --> 3.1 --> 2.82 --> 2.68 --> 2.43 --> 2.2 --> 1.9 - continue with Torsemide as outline above but if blood pressure low, may need to lower the dose  - per nephrology    Hepatic encephalopathy - continue rifaximin and lactulose  - pt still multiple loose stools, I have already lowered the dose and frequency of lactulose but suspect he may be more somnolent if not enough of lactulose  - wife said he is 15 gm BID so will change to home regimen and see how he does  - pt still somnolent this am   Hyponatremia - likely secondary to effects of ADH in the setting of liver cirrhosis  - may likely be new baseline    Chronic thrombocytopenia  - in the setting of liver cirrhosis - diffuse petechiae noted, overall stable    UTI with hematuria - completed 5 days therapy with Rocephin    HHT (hereditary hemorrhagic telangiectasia) (HCC)   Anemia due to chronic blood loss - Fe def - per nephrology team    Buttock wound, right  - Pressure  Ulcer stage 3 pressure injury present on admission  - Flexiseal to attempt to contain, but still leaks around the insertion site and it is difficult to keep the dressing over the buttock wound from becoming soiled.   DVT prophylaxis: SCD's Code Status: DNR Family Communication: Patient at bedside, wife at bedside updated several times today Disposition Plan: SNF in am if pt more alert   Consultants:   Cardiology   Nephrology  Procedures:   None  Antimicrobials:   Rocephin 7/6 -->  7/10  Procedures/Studies: Dg Chest 2 View  06/14/2016  CLINICAL DATA:  Increasing shortness of breath and swelling since this morning, CHF, cirrhosis, atrial fibrillation, pulmonary hypertension, history melanoma EXAM: CHEST  2 VIEW COMPARISON:  05/01/2016 FINDINGS: LEFT subclavian transvenous pacemaker lead projects over RIGHT ventricle. Enlargement of cardiac silhouette with pulmonary vascular congestion. Atherosclerotic calcification aorta. Perihilar infiltrates greater on LEFT question asymmetric edema versus infection. Subsegmental atelectasis LEFT base. No pleural effusion or pneumothorax Bones demineralized. IMPRESSION: Enlargement of cardiac silhouette with pulmonary vascular congestion. Accentuated perihilar markings particularly on LEFT question asymmetric edema versus infection. Aortic atherosclerosis and pacemaker noted. Electronically Signed   By: Lavonia Dana M.D.   On: 06/14/2016 17:57   US Renal  06/15/2016  CLINICAL DATA:  Acute renal failure EXAM: RENAL / URINARY TRACT ULTRASOUND COMPLETE COMPARISON:  None. FINDINGS: Right Kidney: Length: 12.7 cm. Mildly echogenic right renal parenchyma. No right hydronephrosis. Questionable hyperechoic 0.8 x 0.6 x 0.9 cm renal cortical lesion in the upper right kidney (possibly artifactual due to junctional defect). Several tiny simple appearing renal cortical cysts in the right kidney, largest 1.2 x 1.0 x 1.0 cm in the interpolar right kidney. Left Kidney: Length: 12.2 cm. Mildly echogenic left renal parenchyma. No left hydronephrosis. Simple appearing small renal cortical cysts in the left kidney, largest 1.6 x 1.3 x 0.7 cm in the lateral left kidney. Bladder: Urinary bladder is collapsed by indwelling Foley catheter and cannot be evaluated on this scan. IMPRESSION: 1. No hydronephrosis. 2. Mildly echogenic kidneys, indicating nonspecific renal parenchymal disease of uncertain chronicity. 3. Questionable hyperechoic 0.9 cm renal cortical lesion in the upper  right kidney, neoplasm not excluded. Recommend further evaluation with renal mass protocol MRI or CT abdomen without and with IV contrast on a short term outpatient basis. 4. Urinary bladder collapsed by indwelling Foley catheter and not evaluated on this scan. Electronically Signed   By: Ilona Sorrel M.D.   On: 06/15/2016 12:00    Discharge Exam: Filed Vitals:   06/22/16 0431 06/22/16 1229  BP: 104/49 86/35  Pulse: 82 83  Temp: 97.7 F (36.5 C) 97 F (36.1 C)  Resp: 21 18   Filed Vitals:   06/21/16 1227 06/21/16 2145 06/22/16 0431 06/22/16 1229  BP: 109/40 97/38 104/49 86/35  Pulse: 80 80 82 83  Temp: 97.6 F (36.4 C) 97.6 F (36.4 C) 97.7 F (36.5 C) 97 F (36.1 C)  TempSrc: Oral Oral Oral Oral  Resp: 20 16 21 18   Height:      Weight:   76.204 kg (168 lb)   SpO2: 100% 99% 98% 97%    General: Pt is not in acute distress Cardiovascular: Regular rate and rhythm, S1/S2 +, no rubs, no gallops Respiratory: Clear to auscultation bilaterally, no wheezing, no crackles, no rhonchi Abdominal: Soft, non tender, non distended, bowel sounds +, no guarding Extremities: no cyanosis, pulses palpable bilaterally DP and PT, diffuse petechiae  Neuro: still somewhat somnolent but easy to awake, moving all  4 extremities against gravity   Discharge Instructions     Medication List    STOP taking these medications        acyclovir 400 MG tablet  Commonly known as:  ZOVIRAX      TAKE these medications        b complex-vitamin c-folic acid 0.8 MG Tabs tablet  Take 1 tablet by mouth daily.     cyanocobalamin 1000 MCG/ML injection  Commonly known as:  (VITAMIN B-12)  Inject 1,000 mcg into the muscle every 30 (thirty) days. 1st of every month     digoxin 0.125 MG tablet  Commonly known as:  LANOXIN  Take 1 tablet (0.125 mg total) by mouth 3 (three) times a week. Mon, Wed, Fri     feeding supplement (ENSURE ENLIVE) Liqd  Take 237 mLs by mouth 2 (two) times daily between meals.      feeding supplement (ENSURE ENLIVE) Liqd  Take 237 mLs by mouth 2 (two) times daily between meals.     ferrous sulfate 325 (65 FE) MG EC tablet  Take 325 mg by mouth 2 (two) times daily with a meal.     Gerhardt's butt cream Crea  Apply 1 application topically 3 (three) times daily.     KLOR-CON M20 20 MEQ tablet  Generic drug:  potassium chloride SA  TAKE TWO TABLETS BY MOUTH IN THE MORNING, THEN TAKE ONE IN THE EVENING     lactulose 10 GM/15ML solution  Commonly known as:  CHRONULAC  Take 45 mLs (30 g total) by mouth 3 (three) times daily. Titrate to 3-4 BM's day     lactulose 10 GM/15ML solution  Commonly known as:  CHRONULAC  TAKE 45MLS BY MOUTH TWICE DAILY     Magnesium 250 MG Tabs  Take 250 mg by mouth daily.     metolazone 2.5 MG tablet  Commonly known as:  ZAROXOLYN  Take 1 tablet (2.5 mg total) by mouth as needed (for weight of 171 lb or greater).     ondansetron 4 MG tablet  Commonly known as:  ZOFRAN  Take 1 tablet (4 mg total) by mouth every 8 (eight) hours as needed for nausea or vomiting.     oxymetazoline 0.05 % nasal spray  Commonly known as:  AFRIN  Place 1 spray into both nostrils 2 (two) times daily.     pantoprazole 40 MG tablet  Commonly known as:  PROTONIX  TAKE ONE TABLET BY MOUTH ONCE DAILY     promethazine 12.5 MG tablet  Commonly known as:  PHENERGAN  Take 0.5 tablets (6.25 mg total) by mouth every 8 (eight) hours as needed for nausea or vomiting.     rifaximin 550 MG Tabs tablet  Commonly known as:  XIFAXAN  Take 1 tablet (550 mg total) by mouth 2 (two) times daily.     saline Gel  Place 1 application into the nose 2 (two) times daily as needed (for nose bleeds).     spironolactone 50 MG tablet  Commonly known as:  ALDACTONE  Take 50 mg (1 tab) in am and 25 mg (1/2 tab) in pm.     SYRINGE-NEEDLE (DISP) 3 ML 25G X 5/8" 3 ML Misc  To use with B12 injections     torsemide 100 MG tablet  Commonly known as:  DEMADEX  Take 1 tablet (100  mg total) by mouth daily.            Follow-up Information    Follow  up with [facility].   Why:  They will continue to do your home health care at your home   Contact information:   [address] [place] [address] [phone]       Follow up with Glori Bickers, MD On 07/06/2016.   Specialty:  Cardiology   Why:  at 230 pm for post hospital follow. Please bring all of your medications to your visit. The code for parking is 3000.   Contact information:   [address] [place] [address] [phone]        The results of significant diagnostics from this hospitalization (including imaging, microbiology, ancillary and laboratory) are listed below for reference.     Microbiology: Recent Results (from the past 240 hour(s))  Urine culture     Status: None   Collection Time: 06/15/16 10:59 AM  Result Value Ref Range Status   Specimen Description URINE, RANDOM  Final   Special Requests NONE  Final   Culture NO GROWTH  Final   Report Status 06/16/2016 FINAL  Final     Labs: Basic Metabolic Panel:  Recent Labs Lab 06/16/16 0408  06/18/16 0946 06/19/16 0425 06/20/16 0355 06/21/16 0244 06/22/16 0427  NA 128*  < > 125* 123* 122* 123* 125*  K 3.9  < > 3.4* 4.0 4.2 4.6 4.6  CL 93*  < > 89* 86* 86* 86* 88*  CO2 25  < > 25 25 26 27 27   GLUCOSE 90  < > 180* 101* 91 103* 94  BUN 85*  < > 81* 80* 81* 85* 86*  CREATININE 3.18*  < > 2.68* 2.43* 2.27* 2.28* 1.99*  CALCIUM 8.9  < > 8.8* 8.9 8.8* 8.8* 8.8*  PHOS 5.5*  --   --   --   --   --  2.7  < > = values in this interval not displayed. Liver Function Tests:  Recent Labs Lab 06/16/16 0408 06/22/16 0427  ALBUMIN 2.6* 2.5*   CBC:  Recent Labs Lab 06/17/16 0430 06/19/16 0425 06/20/16 0355 06/21/16 0244 06/22/16 0426  WBC 4.8 7.1 5.8 6.7 5.9  HGB 7.4* 7.8* 7.6* 7.6* 7.7*  HCT 22.9* 24.3* 23.1* 23.7* 23.8*  MCV 87.7 89.0 89.9 91.2 92.2  PLT 52* 64* 66* 82*  69*   BNP (last 3 results)  Recent Labs  09/02/15 1239 06/14/16 1640  BNP 280.5* 340.4*    ProBNP (last 3 results)  Recent Labs  08/09/15 1621  PROBNP 188.0*   SIGNED: Time coordinating discharge:  30 minutes  Faye Ramsay, MD  [facility] 06/22/2016, 12:41 PM Pager [phone]   If 7PM-7AM, please contact night-coverage [email or web address] Password [email or web address]

## 2016-06-22 NOTE — Clinical Social Work Note (Addendum)
Call not returned from [facility] SN[facility]. CSW called and left another voicemail with admissions coordinator.  Dayton Scrape, CSW [phone]  10:33 am [facility] Flock declined. Patient's wife chooses [facility] and wants him to have a private room. CSW sent message to admissions coordinator, Claiborne Billings, making her aware.  Dayton Scrape, [place] [phone]  11:47 am Tarri Glenn has a bed in a semiprivate room available tomorrow. Wife agreeable. MD paged with information.  Dayton Scrape, [place]

## 2016-06-22 NOTE — Progress Notes (Signed)
Straight cath patient obtained 1000 ml of urine. Tolerated procedure well. Patient stated relief. Continued to observe pt.

## 2016-06-22 NOTE — Care Management Important Message (Signed)
Important Message  Patient Details  Name: Hunter [name] MRN: [id number] Date of Birth: 1935/08/04   Medicare Important Message Given:  Yes    [name] 06/22/2016, 8:19 AM

## 2016-06-22 NOTE — Consult Note (Addendum)
WOC wound re-consult performed per wife's request.  She is at the bedside and assessed the wound and discussed plan of care.  She also stated that the patient had shingles and was treated with medication prior to admission; and the lesions on his buttocks developed as a result of moisture related to severe diarrhea, shingles, and pressure from prolonged bedrest.  Refer to previous consult note on 7/10.  Reason for Consult: Consult requested for buttock wound.  Pressure Ulcer POA: Yes; this was noted as a stage 3 pressure injury present on admission by the bedside nurse on 7/5 flow sheet. Pt has been frequently incontinent of stool and has a Flexiseal to attempt to contain, but still leaks around the insertion site and it is difficult to keep the dressing over the buttock wound from becoming soiled. Measurement: Right buttock 1X1X.2cm Wound bed: 90% red, 10% yellow patchy areas interspersed throughout. Drainage (amount, consistency, odor) Mod amt yellow drainage on the dressing, no odor Periwound: Surrounding skin with red macerated skin and patchy areas of partial thickness skin loss; appearance consistent with moisture associated skin damage.Dry scabs scattered to upper buttocks from resolving shingles.  Dressing procedure/placement/frequency: Stage 3 wound appearance has slightly improved since implementation of alginate dressing 3 days ago, and wife agrees. Continue present plan of care with Alginate to absorb moisture and promote healing, and foam dressing to attempt to protect from soiling. Pt is on a low-airloss bed to reduce pressure to the affected areas and increase airflow to bilat buttocks.  Discussed plan of care with wife at the bedside and she verbalized understanding. Please re-consult if further assistance is needed. Thank-you,  Julien Girt MSN, [place], [place], [place], [place]

## 2016-06-22 NOTE — Consult Note (Addendum)
WOC consult requested for buttock wound; consult was previously performed on 7/10.  Please refer to progress notes for assessment and measurements.  Topical treatment orders have been provided for bedside nurses.  Please re-consult if further assistance is needed.  Thank-you,  Julien Girt MSN, [place], [place], [place], [place]

## 2016-06-23 DIAGNOSIS — L8990 Pressure ulcer of unspecified site, unspecified stage: Secondary | ICD-10-CM

## 2016-06-23 LAB — AMMONIA: Ammonia: 167 umol/L — ABNORMAL HIGH (ref 9–35)

## 2016-06-23 MED ORDER — CEFTRIAXONE SODIUM 1 G IJ SOLR
1.0000 g | Freq: Once | INTRAMUSCULAR | Status: AC
  Administered 2016-06-23: 1 g via INTRAVENOUS

## 2016-06-23 MED ORDER — LACTULOSE 10 GM/15ML PO SOLN
20.0000 g | Freq: Three times a day (TID) | ORAL | Status: DC
  Administered 2016-06-23 – 2016-06-24 (×3): 20 g via ORAL
  Filled 2016-06-23 (×5): qty 30

## 2016-06-23 NOTE — Progress Notes (Signed)
Pt has hx of Olser-Weber-Rendu syndrome; his skin tears EXTREMELY easily, almost falls off.  Pt has several open skin tears on bilateral upper extremities, Left back at shoulder blade.  Nurses have applied new dressings with Xeroform, 4x4 gauzes, covered with Kerlix.  DO NOT APPLY ANY TYPE OF TAPE TO THIS PT'S SKIN, AS IT WILL TEAR.  When changing dressings, must use kerlix to secure the dressings.  Please, no tape.

## 2016-06-23 NOTE — Progress Notes (Signed)
PT Cancellation Note  Patient Details Name: Hunter [name] MRN: [id number] DOB: [birth year]   Cancelled Treatment:    Reason Eval/Treat Not Completed: Other (comment) (Per SW pt planning to d/c to SNF this afternoon.)  Collie Siad PT, DPT  Pager: [phone] Phone: [phone] 06/23/2016, 3:03 PM

## 2016-06-23 NOTE — Progress Notes (Signed)
Noted rectal tube seen out.not sure whether pt pulled rectal tube out. No bleeding per rectum noted. Will continue to monitor

## 2016-06-23 NOTE — Progress Notes (Signed)
Foley catheter inserted per order for urinary retention. Patient tolerated procedure well. Sterile technique preformed. Glean Hess, RN assisted. 700cc out. Will continue to monitor.   Clarise Cruz RN

## 2016-06-23 NOTE — Progress Notes (Signed)
Patient ID: Hunter [name], male   DOB: 02/16/1935, 81 y.o.   MRN: [id number]    PROGRESS NOTE    Tade Ostertag  [name] DOB: 1935-08-31 DOA: 06/14/2016  PCP: Penni Homans, MD   Brief Narrative:  81 y.o. male with known systolic and diastolic HF Echo Q000111Q EF 45-50% NYHA class III, pulmonary hypertension, liver failure, CKD stage III, and hereditary hemorrhagic telangiectasia with hepatic AVMs and associated anemia requiring transfusion, now presented to [facility] ED with main concern of several days duration of progressively worsening dyspnea with exertion and at rest. He was admitted for acute on chronic combined systolic and diastolic CHF.  Cardiology and nephrology teams  Nephrology team consulted and recommendations given.   7/14 could not discharge pt as planned as his pressure was 80/30's this am, he was lethargic, encephalopathic,  his ammonia level was elevated at 160's, increased the dose of lactulose to 20 mg TID. Updated the wife and RN at bedside.   Assessment & Plan:   Active Problems:   Acute on chronic combined systolic and diastolic CHF, NYHA class III - last ECHO in 2016 with EF 45% and appears unchanged based on repeat ECHO on this admission - was on lasix 80 mg IV BID, changed to Torsemide 100 mg PO QD 7/8 - appreciate nephrology team recommendations, will continue with torsemide and continue with fluid restriction.  - cardiology team signed off as there are no further recommendations  - his weight more appropriate after diuresis.     Acute on chronic kidney disease stage III - IV - with GFR in 30-40's and baseline Cr 2 - 2.2 - now up likely in the setting of volume overload - Cr is trending down from 3.4 --> 3.1 --> 2.82 --> 2.68 --> 2.43 --> 2.2,--> 1.9 stable in the past 24 hours  - continue with Torsemide as outline above  - outpatient follow upwith nephrology.     Hepatic encephalopathy - worsened this am, more lethargic. Unclear with his hard of hearing,  wife at bedside,says he is more lethargic than usual. Repeat ammonia level ordered an d it is elevated at 167, increased the lactulose to 20 mg TID.  - continue rifaximin and lactulose  - apparentlyhis rectal tube came out, will keep it out.       Hyponatremia - likely secondary to effects of ADH in the setting of liver cirrhosis  - may likely be new baseline , slightly improved than yesterday.     Chronic thrombocytopenia  - in the setting of liver cirrhosis - diffuse petechiae noted, overall stable  - no signs of bleeding.     UTI with hematuria - completed 5 days therapy with Rocephin     HHT (hereditary hemorrhagic telangiectasia) (HCC)    Anemia due to chronic blood loss - Fe def - per nephrology team   - no signs of bleeding - hemoglobin stable at 7.7    Buttock wound, right  - Pressure Ulcer stage 3 pressure injury present on admission  - further recommendations as per wound care.    DVT prophylaxis: SCD's Code Status: DNR Family Communication: wife at bedside, discussed the plan of care.  Disposition Plan: SNF in 1-2 days, family in agreement, provided pt more alert   Consultants:   Cardiology   Nephrology   Procedures:   None  Antimicrobials:   Rocephin 7/6 --> 7/10  Subjective: Denies any pain, sleepy and lethargic.   Objective: Filed Vitals:   06/22/16 2122 06/23/16 0558 06/23/16  [id number] 06/23/16 1412  BP: 95/33 87/34  93/53  Pulse: 84 80 84 80  Temp: 97.8 F (36.6 C) 97.4 F (36.3 C)  97.8 F (36.6 C)  TempSrc: Oral Oral  Oral  Resp: 18 17  18   Height:      Weight:  80.287 kg (177 lb)    SpO2: 97% 98%  98%    Intake/Output Summary (Last 24 hours) at 06/23/16 1727 Last data filed at 06/23/16 1300  Gross per 24 hour  Intake    540 ml  Output   1350 ml  Net   -810 ml   Filed Weights   06/21/16 0507 06/22/16 0431 06/23/16 0558  Weight: 80.287 kg (177 lb) 76.204 kg (168 lb) 80.287 kg (177 lb)    Examination:  General exam: Appears  somnolent, unable to stay awake for more than few seconds , more today  Respiratory system: Respiratory effort normal. Diminished at bases  Cardiovascular system: S1 & S2 heard, RRR. No JVD, rubs, gallops or clicks. SEM 2/6 Gastrointestinal system: Abdomen is nondistended, soft and nontender. No organomegaly or masses felt.  Central nervous system: moving all 4 extremities spontaneously but rather somnolent  Skin: multiple petechiae   Data Reviewed: I have personally reviewed following labs and imaging studies  CBC:  Recent Labs Lab 06/17/16 0430 06/19/16 0425 06/20/16 0355 06/21/16 0244 06/22/16 0426  WBC 4.8 7.1 5.8 6.7 5.9  HGB 7.4* 7.8* 7.6* 7.6* 7.7*  HCT 22.9* 24.3* 23.1* 23.7* 23.8*  MCV 87.7 89.0 89.9 91.2 92.2  PLT 52* 64* 66* 82* 69*   Basic Metabolic Panel:  Recent Labs Lab 06/18/16 0946 06/19/16 0425 06/20/16 0355 06/21/16 0244 06/22/16 0427  NA 125* 123* 122* 123* 125*  K 3.4* 4.0 4.2 4.6 4.6  CL 89* 86* 86* 86* 88*  CO2 25 25 26 27 27   GLUCOSE 180* 101* 91 103* 94  BUN 81* 80* 81* 85* 86*  CREATININE 2.68* 2.43* 2.27* 2.28* 1.99*  CALCIUM 8.8* 8.9 8.8* 8.8* 8.8*  PHOS  --   --   --   --  2.7   Liver Function Tests:  Recent Labs Lab 06/22/16 0427  ALBUMIN 2.5*    Recent Labs Lab 06/23/16 1110  AMMONIA 167*   Cardiac Enzymes: No results for input(s): CKTOTAL, CKMB, CKMBINDEX, TROPONINI in the last 168 hours. BNP (last 3 results)  Recent Labs  08/09/15 1621  PROBNP 188.0*   Urine analysis:    Component Value Date/Time   COLORURINE YELLOW 06/15/2016 [place] 06/15/2016 0721   LABSPEC 1.011 06/15/2016 0721   PHURINE 6.5 06/15/2016 0721   GLUCOSEU NEGATIVE 06/15/2016 0721   HGBUR LARGE* 06/15/2016 0721   BILIRUBINUR NEGATIVE 06/15/2016 0721   KETONESUR NEGATIVE 06/15/2016 0721   PROTEINUR 100* 06/15/2016 0721   UROBILINOGEN 0.2 08/10/2015 1700   NITRITE NEGATIVE 06/15/2016 0721   LEUKOCYTESUR MODERATE* 06/15/2016  0721   Radiology Studies: No results found.  Scheduled Meds: . darbepoetin (ARANESP) injection - NON-DIALYSIS  100 mcg Subcutaneous Q Thu-1800  . digoxin  0.125 mg Oral Q M,W,F  . feeding supplement (ENSURE ENLIVE)  237 mL Oral BID BM  . ferrous sulfate  325 mg Oral BID WC  . lactulose  20 g Oral TID  . pantoprazole  40 mg Oral Daily  . rifaximin  550 mg Oral BID  . sodium chloride flush  3 mL Intravenous Q12H  . spironolactone  50 mg Oral Daily   And  . spironolactone  25 mg Oral QHS  . tamsulosin  0.4 mg Oral Daily  . torsemide  100 mg Oral Daily   Continuous Infusions:    LOS: 9 days    Time spent: 30 minutes    Urbano Milhouse, MD [facility] Pager [phone]  If 7PM-7AM, please contact night-coverage [email or web address] Password [facility] 06/23/2016, 5:27 PM

## 2016-06-23 NOTE — Progress Notes (Signed)
Noted pt has not voided for 8 hrs. Bladder scan done. Noted 625 cc. Straight cath pt obtained 600 cc. Tolerated procedure well.

## 2016-06-23 NOTE — Progress Notes (Signed)
Physical Therapy Treatment Patient Details Name: Hunter [name] MRN: [id number] DOB: Jun 27, 1935 Today's Date: 06/23/2016    History of Present Illness Pt is a 81 y/o M who presented with SOB.  Pt's PMH inlcudes recurrent epistaxis, chronic anemia due to chronic blood loss (recently admitted secondary to vomiting bright red blood), hepatic AVM, HF, s/p pacemaker, recurrent hepatic encephalopathy, CKD.  Pt with UTI, CXR with pulmonary vascular congestion.    PT Comments    Hunter [name] presents with impaired cognition, lethargy, and is quick to fatigue.  Attempted to sit EOB; however pt requires max assist to push up to his elbow in sidelying and resists any further mobility.  Repositioned pt to have pillow between knees and ankles to avoid development of sores.  Notified RN of this as well as the need to rotate pt q2h.     Follow Up Recommendations  SNF;Supervision/Assistance - 24 hour     Equipment Recommendations  Other (comment) (TBD at next venue of care)    Recommendations for Other Services OT consult     Precautions / Restrictions Precautions Precautions: Fall Restrictions Weight Bearing Restrictions: No    Mobility  Bed Mobility Overal bed mobility: Needs Assistance Bed Mobility: Sidelying to Sit;Sit to Sidelying   Sidelying to sit: HOB elevated;Max assist     Sit to sidelying: Max assist General bed mobility comments: Assist to elevate trunk and to manage LEs.  Pt assists minimally to push up to elbow but then begins resisting therapist to sit all the way up and was assisted back to sidelying.  Transfers                 General transfer comment: Unable to attempt   Ambulation/Gait                 Stairs            Wheelchair Mobility    Modified Rankin (Stroke Patients Only)       Balance Overall balance assessment:  (Unable to test as pt does not achieve sitting EOB)                                  Cognition  Arousal/Alertness: Lethargic Behavior During Therapy: Flat affect Overall Cognitive Status: Impaired/Different from baseline Area of Impairment: Safety/judgement;Problem solving;Awareness;Orientation;Attention;Memory;Following commands Orientation Level: Disoriented to;Place;Time;Situation;Person Current Attention Level: Focused Memory: Decreased short-term memory Following Commands: Follows one step commands inconsistently Safety/Judgement: Decreased awareness of safety;Decreased awareness of deficits Awareness: Intellectual Problem Solving: Slow processing;Decreased initiation;Difficulty sequencing;Requires tactile cues;Requires verbal cues General Comments: Pt lethargic but nods in agreement to attempt OOB to chair.  Keeps eyes close during most of session. Unable to report correct DOB and does not follow cues consistently.    Exercises      General Comments General comments (skin integrity, edema, etc.): Repositioned pt to have pillow between knees and ankles to avoid development of sores.  Notified RN of this as well as the need to rotate pt q2h.  In addition, clear liquid in bed close to pt's head/shoulder that could be drool, water, or some other fluid.  RN already aware.      Pertinent Vitals/Pain Pain Assessment: Faces Faces Pain Scale: Hurts little more Pain Location: generalized with movement Pain Descriptors / Indicators: Grimacing Pain Intervention(s): Limited activity within patient's tolerance;Monitored during session;Repositioned    Home Living  Prior Function            PT Goals (current goals can now be found in the care plan section) Acute Rehab PT Goals Patient Stated Goal: none stated PT Goal Formulation: With patient Time For Goal Achievement: 07/02/16 Potential to Achieve Goals: Fair Progress towards PT goals: Not progressing toward goals - comment (due to lethargy, fatigue)    Frequency  Min 2X/week    PT Plan Current  plan remains appropriate    Co-evaluation             End of Session   Activity Tolerance: Patient limited by fatigue;Patient limited by lethargy Patient left: with call bell/phone within reach;with family/visitor present;in bed     Time: [id number] PT Time Calculation (min) (ACUTE ONLY): 14 min  Charges:  $Therapeutic Activity: 8-22 mins                    G Codes:      Collie Siad PT, DPT  Pager: [phone] Phone: [phone] 06/23/2016, 4:46 PM

## 2016-06-24 DIAGNOSIS — D638 Anemia in other chronic diseases classified elsewhere: Secondary | ICD-10-CM | POA: Diagnosis present

## 2016-06-24 DIAGNOSIS — I5043 Acute on chronic combined systolic (congestive) and diastolic (congestive) heart failure: Secondary | ICD-10-CM | POA: Diagnosis present

## 2016-06-24 DIAGNOSIS — N2889 Other specified disorders of kidney and ureter: Secondary | ICD-10-CM | POA: Diagnosis present

## 2016-06-24 MED ORDER — LACTULOSE 10 GM/15ML PO SOLN
30.0000 g | Freq: Once | ORAL | Status: AC
  Administered 2016-06-24: 30 g via ORAL
  Filled 2016-06-24: qty 45

## 2016-06-24 NOTE — Progress Notes (Signed)
Progress Note    Hunter [name]  [id number] DOB: 10-06-1935  DOA: 06/14/2016 PCP: Penni Homans, MD    Brief Narrative:   80 y.o. male with known systolic and diastolic HF Echo [id number] EF 45-50% NYHA class III, pulmonary hypertension, liver failure, CKD stage III, and hereditary hemorrhagic telangiectasia with hepatic AVMs and associated anemia requiring transfusion, now presented to [facility] ED with main concern of several days duration of progressively worsening dyspnea with exertion and at rest. He was admitted for acute on chronic combined systolic and diastolic CHF. Cardiology and nephrology teams Nephrology team consulted and recommendations given. Although it was hoped that the patient could be discharged on 06/23/16, the patient was hypotensive, lethargic, and encephalopathic with ammonia levels in the 160s. Lactulose dosing was increased. Continues to be encephalopathic.  Assessment/Plan:   Principal problem:  Acute on chronic combined systolic and diastolic CHF, NYHA class III, improving The patient's last ECHO was done in 2016 with EF 45% and appears unchanged based on repeat ECHO on this admission. He previously was on lasix 80 mg IV BID, changed to Torsemide 100 mg PO QD 06/17/16. Evaluated by nephrology with recommendations to continue torsemide and fluid restriction. No further recommendations per cardiology. Monitor weight and volume status. Continue digoxin.  Active problems:   Right renal mass, stable 0.9 cm mass discovered incidentally on renal ultrasound. Neoplasm could not be excluded. Given current comorbidities, would avoid inpatient workup. If patient stabilizes as an outpatient, consider renal mass protocol MRI or CT of the abdomen with/without IV contrast for further evaluation.   Acute on chronic kidney disease stage III - IV/hyperkalemia, improving The patient's baseline GFR in 30-40's and baseline Cr 2 - 2.2. Initially elevated over usual baseline values with a  creatinine as high as 3.4, now back to baseline values. Will need outpatient nephrology follow-up. Hyperkalemia was secondary to acute renal failure which has now resolved.   Hepatic encephalopathy in the setting of chronic cirrhosis of the liver, chronic Repeat ammonia level elevated at 167 on 06/23/16, increased the lactulose to 20 mg TID. Continue rifaximin. We'll give an extra dose of lactulose today as patient's wife says that his bowels have not moved in the last 24 hours. Discontinue hydrocodone.   Hyponatremia, chronic/stable Secondary to cirrhosis and CHF physiology. Continue with fluid restriction.   Chronic thrombocytopenia  Secondary to cirrhosis of the liver.   UTI with hematuria, resolved Status post 5 days therapy with Rocephin.    HHT (hereditary hemorrhagic telangiectasia) ([place]), chronic   Anemia of chronic disease and chronic blood loss from hereditary hemorrhagic telangiectasia, chronic Monitor hemoglobin. Continue Aranesp and iron.   Buttock wound, right, ongoing  Pressure Ulcer stage 3 pressure injury present on admission. Continue present plan of care with Alginate to absorb moisture and promote healing, and foam dressing to attempt to protect from soiling. Pt is on a low-airloss bed to reduce pressure to the affected areas and increase airflow to bilat buttocks.  Family Communication/Anticipated D/C date and plan/Code Status   DVT prophylaxis: SCDs ordered. Code Status: DO NOT RESUSCITATE Family Communication: Wife updated at the bedside. Disposition Plan: SNF 06/25/16 if more alert.   Medical Consultants:    Cardiology  Nephrology   Procedures:    Anti-Infectives:   Acyclovir 06/14/16---> 06/15/16 Rifaximin 06/14/16---> Rocephin 06/15/16---> 06/23/16  Subjective:    Hunter [name] continues to be lethargic although his wife says that he did eat some breakfast this morning. She says that he has not moved  his bowels in over 24  hours.  Objective:    Filed Vitals:   06/23/16 0954 06/23/16 1412 06/24/16 0447 06/24/16 1157  BP:  93/53 100/45 99/54  Pulse: 84 80 80 79  Temp:  97.8 F (36.6 C) 97.5 F (36.4 C) 98.3 F (36.8 C)  TempSrc:  Oral Oral Oral  Resp:  18 21 21   Height:      Weight:   68.04 kg (150 lb)   SpO2:  98% 99% 94%    Intake/Output Summary (Last 24 hours) at 06/24/16 1854 Last data filed at 06/24/16 1500  Gross per 24 hour  Intake    243 ml  Output   1420 ml  Net  -1177 ml   Filed Weights   06/22/16 0431 06/23/16 0558 06/24/16 0447  Weight: 76.204 kg (168 lb) 80.287 kg (177 lb) 68.04 kg (150 lb)    Exam: General exam: Appears calm and comfortable. Sleeping and difficult to arouse. Respiratory system: Clear to auscultation. Respiratory effort normal. Cardiovascular system: S1 & S2 heard, RRR. No JVD,  rubs, gallops or clicks. No murmurs. Gastrointestinal system: Abdomen is nondistended, soft and nontender. No organomegaly or masses felt. Normal bowel sounds heard. Central nervous system: Somnolent. No focal neurological deficits. Extremities: No clubbing, edema, or cyanosis. Skin: Telangiectasias. Psychiatry: Lethargic, unable to assess.   Data Reviewed:   I have personally reviewed following labs and imaging studies:  Labs: Basic Metabolic Panel:  Recent Labs Lab 06/18/16 0946 06/19/16 0425 06/20/16 0355 06/21/16 0244 06/22/16 0427  NA 125* 123* 122* 123* 125*  K 3.4* 4.0 4.2 4.6 4.6  CL 89* 86* 86* 86* 88*  CO2 25 25 26 27 27   GLUCOSE 180* 101* 91 103* 94  BUN 81* 80* 81* 85* 86*  CREATININE 2.68* 2.43* 2.27* 2.28* 1.99*  CALCIUM 8.8* 8.9 8.8* 8.8* 8.8*  PHOS  --   --   --   --  2.7   GFR Estimated Creatinine Clearance: 29.4 mL/min (by C-G formula based on Cr of 1.99). Liver Function Tests:  Recent Labs Lab 06/22/16 0427  ALBUMIN 2.5*    Recent Labs Lab 06/23/16 1110  AMMONIA 167*    CBC:  Recent Labs Lab 06/19/16 0425 06/20/16 0355  06/21/16 0244 06/22/16 0426  WBC 7.1 5.8 6.7 5.9  HGB 7.8* 7.6* 7.6* 7.7*  HCT 24.3* 23.1* 23.7* 23.8*  MCV 89.0 89.9 91.2 92.2  PLT 64* 66* 82* 69*   BNP (last 3 results)  Recent Labs  08/09/15 1621  PROBNP 188.0*   Microbiology Recent Results (from the past 240 hour(s))  Urine culture     Status: None   Collection Time: 06/15/16 10:59 AM  Result Value Ref Range Status   Specimen Description URINE, RANDOM  Final   Special Requests NONE  Final   Culture NO GROWTH  Final   Report Status 06/16/2016 FINAL  Final    Radiology:  Dg Chest 2 View  06/14/2016  CLINICAL DATA:  Increasing shortness of breath and swelling since this morning, CHF, cirrhosis, atrial fibrillation, pulmonary hypertension, history melanoma EXAM: CHEST  2 VIEW COMPARISON:  05/01/2016 FINDINGS: LEFT subclavian transvenous pacemaker lead projects over RIGHT ventricle. Enlargement of cardiac silhouette with pulmonary vascular congestion. Atherosclerotic calcification aorta. Perihilar infiltrates greater on LEFT question asymmetric edema versus infection. Subsegmental atelectasis LEFT base. No pleural effusion or pneumothorax Bones demineralized. IMPRESSION: Enlargement of cardiac silhouette with pulmonary vascular congestion. Accentuated perihilar markings particularly on LEFT question asymmetric edema versus infection. Aortic atherosclerosis and  pacemaker noted. Electronically Signed   By: Lavonia Dana M.D.   On: 06/14/2016 17:57   US Renal  06/15/2016  CLINICAL DATA:  Acute renal failure EXAM: RENAL / URINARY TRACT ULTRASOUND COMPLETE COMPARISON:  None. FINDINGS: Right Kidney: Length: 12.7 cm. Mildly echogenic right renal parenchyma. No right hydronephrosis. Questionable hyperechoic 0.8 x 0.6 x 0.9 cm renal cortical lesion in the upper right kidney (possibly artifactual due to junctional defect). Several tiny simple appearing renal cortical cysts in the right kidney, largest 1.2 x 1.0 x 1.0 cm in the interpolar right  kidney. Left Kidney: Length: 12.2 cm. Mildly echogenic left renal parenchyma. No left hydronephrosis. Simple appearing small renal cortical cysts in the left kidney, largest 1.6 x 1.3 x 0.7 cm in the lateral left kidney. Bladder: Urinary bladder is collapsed by indwelling Foley catheter and cannot be evaluated on this scan. IMPRESSION: 1. No hydronephrosis. 2. Mildly echogenic kidneys, indicating nonspecific renal parenchymal disease of uncertain chronicity. 3. Questionable hyperechoic 0.9 cm renal cortical lesion in the upper right kidney, neoplasm not excluded. Recommend further evaluation with renal mass protocol MRI or CT abdomen without and with IV contrast on a short term outpatient basis. 4. Urinary bladder collapsed by indwelling Foley catheter and not evaluated on this scan. Electronically Signed   By: Ilona Sorrel M.D.   On: 06/15/2016 12:00    Medications:   . darbepoetin (ARANESP) injection - NON-DIALYSIS  100 mcg Subcutaneous Q Thu-1800  . digoxin  0.125 mg Oral Q M,W,F  . feeding supplement (ENSURE ENLIVE)  237 mL Oral BID BM  . ferrous sulfate  325 mg Oral BID WC  . lactulose  20 g Oral TID  . pantoprazole  40 mg Oral Daily  . rifaximin  550 mg Oral BID  . sodium chloride flush  3 mL Intravenous Q12H  . spironolactone  50 mg Oral Daily   And  . spironolactone  25 mg Oral QHS  . tamsulosin  0.4 mg Oral Daily  . torsemide  100 mg Oral Daily   Continuous Infusions:   Time spent: 35 minutes with > 50% of time discussing current diagnostic test results, clinical impression and plan of care.   LOS: 10 days   [facility] Pager [phone]. If unable to reach me by pager, please call my cell phone at [phone].  *Please refer to [email or web address], password [email or web address] to get updated schedule on who will round on this patient, as hospitalists switch teams weekly. If 7PM-7AM, please contact night-coverage at [email or web address], password [email or web address] for any overnight needs.  06/24/2016,  6:54 PM

## 2016-06-25 DIAGNOSIS — D638 Anemia in other chronic diseases classified elsewhere: Secondary | ICD-10-CM

## 2016-06-25 LAB — COMPREHENSIVE METABOLIC PANEL
ALBUMIN: 2.3 g/dL — AB (ref 3.5–5.0)
ALT: 26 U/L (ref 17–63)
ANION GAP: 7 (ref 5–15)
AST: 37 U/L (ref 15–41)
Alkaline Phosphatase: 160 U/L — ABNORMAL HIGH (ref 38–126)
BUN: 81 mg/dL — ABNORMAL HIGH (ref 6–20)
CALCIUM: 9.2 mg/dL (ref 8.9–10.3)
CO2: 26 mmol/L (ref 22–32)
Chloride: 98 mmol/L — ABNORMAL LOW (ref 101–111)
Creatinine, Ser: 2.15 mg/dL — ABNORMAL HIGH (ref 0.61–1.24)
GFR calc Af Amer: 32 mL/min — ABNORMAL LOW (ref >60)
GFR calc non Af Amer: 28 mL/min — ABNORMAL LOW (ref >60)
GLUCOSE: 114 mg/dL — AB (ref 65–99)
POTASSIUM: 4.8 mmol/L (ref 3.5–5.1)
SODIUM: 131 mmol/L — AB (ref 135–145)
Total Bilirubin: 2 mg/dL — ABNORMAL HIGH (ref 0.3–1.2)
Total Protein: 4.8 g/dL — ABNORMAL LOW (ref 6.5–8.1)

## 2016-06-25 LAB — CBC
HEMATOCRIT: 24.3 % — AB (ref 39.0–52.0)
HEMOGLOBIN: 7.5 g/dL — AB (ref 13.0–17.0)
MCH: 29.2 pg (ref 26.0–34.0)
MCHC: 30.9 g/dL (ref 30.0–36.0)
MCV: 94.6 fL (ref 78.0–100.0)
Platelets: 65 10*3/uL — ABNORMAL LOW (ref 150–400)
RBC: 2.57 MIL/uL — AB (ref 4.22–5.81)
RDW: 25.8 % — ABNORMAL HIGH (ref 11.5–15.5)
WBC: 6 10*3/uL (ref 4.0–10.5)

## 2016-06-25 LAB — AMMONIA: Ammonia: 219 umol/L — ABNORMAL HIGH (ref 9–35)

## 2016-06-25 MED ORDER — LACTULOSE ENEMA
300.0000 mL | Freq: Two times a day (BID) | ORAL | Status: DC
  Administered 2016-06-25: 300 mL via RECTAL
  Filled 2016-06-25 (×3): qty 300

## 2016-06-25 MED ORDER — LACTULOSE ENEMA
300.0000 mL | Freq: Once | ORAL | Status: AC
  Administered 2016-06-25: 300 mL via RECTAL
  Filled 2016-06-25: qty 300

## 2016-06-25 MED ORDER — SODIUM CHLORIDE 0.9 % IV SOLN
INTRAVENOUS | Status: DC
  Administered 2016-06-25 – 2016-06-29 (×4): via INTRAVENOUS

## 2016-06-25 NOTE — Progress Notes (Signed)
Progress Note    Hunter [name]  [name] DOB: 01/14/1935  DOA: 06/14/2016 PCP: Penni Homans, MD    Brief Narrative:   81 y.o. male with known systolic and diastolic HF Echo [id number] EF 45-50% NYHA class III, pulmonary hypertension, liver failure, CKD stage III, and hereditary hemorrhagic telangiectasia with hepatic AVMs and associated anemia requiring transfusion, now presented to [facility] ED with main concern of several days duration of progressively worsening dyspnea with exertion and at rest. He was admitted for acute on chronic combined systolic and diastolic CHF. Cardiology and nephrology teams Nephrology team consulted and recommendations given. Although it was hoped that the patient could be discharged on 06/23/16, the patient was hypotensive, lethargic, and encephalopathic with ammonia levels in the 160s. Lactulose dosing was increased. Continues to be encephalopathic.  Assessment/Plan:   Principal problems:  Acute on chronic combined systolic and diastolic CHF, NYHA class III, improving The patient's last ECHO was done in 2016 with EF 45% and appears unchanged based on repeat ECHO on this admission. He previously was on lasix 80 mg IV BID, changed to Torsemide 100 mg PO QD 06/17/16. Evaluated by nephrology with recommendations to continue torsemide and fluid restriction. No further recommendations per cardiology. Monitor weight and volume status. Continue digoxin. Patient's condition has deteriorated and he is not taking in any by mouth's including medications due to his severe encephalopathy. Will gently hydrate until he is awake and alert and able to take oral hydration.   Hepatic encephalopathy in the setting of chronic cirrhosis of the liver, chronic Repeat ammonia level elevated at 167 on 06/23/16, increased the lactulose to 20 mg TID. Continue rifaximin. Despite being given an extra dose of lactulose 06/24/16, the patient continues to be lethargic and difficult to arouse with a  rising ammonia level. Will give a lactulose enemas BID until mentation improves.  Active problems:   Right renal mass, stable 0.9 cm mass discovered incidentally on renal ultrasound. Neoplasm could not be excluded. Given current comorbidities, would avoid inpatient workup. If patient stabilizes as an outpatient, consider renal mass protocol MRI or CT of the abdomen with/without IV contrast for further evaluation.   Acute on chronic kidney disease stage III - IV/hyperkalemia, improving The patient's baseline GFR in 30-40's and baseline Cr 2 - 2.2. Initially elevated over usual baseline values with a creatinine as high as 3.4, now back to baseline values. Will need outpatient nephrology follow-up. Hyperkalemia was secondary to acute renal failure which has now resolved.   Hyponatremia, chronic/stable Secondary to cirrhosis and CHF physiology.    Chronic thrombocytopenia  Secondary to cirrhosis of the liver.   UTI with hematuria, resolved Status post 5 days therapy with Rocephin.    HHT (hereditary hemorrhagic telangiectasia) ([place]), chronic   Anemia of chronic disease and chronic blood loss from hereditary hemorrhagic telangiectasia, chronic Monitor hemoglobin. Continue Aranesp and iron.   Buttock wound, right, ongoing  Pressure Ulcer stage 3 pressure injury present on admission. Continue present plan of care with Alginate to absorb moisture and promote healing, and foam dressing to attempt to protect from soiling. Pt is on a low-airloss bed to reduce pressure to the affected areas and increase airflow to bilat buttocks.  Family Communication/Anticipated D/C date and plan/Code Status   DVT prophylaxis: SCDs ordered. Code Status: DO NOT RESUSCITATE Family Communication: Wife updated at the bedside. Disposition Plan: SNF 06/25/16 if more alert.   Medical Consultants:    Cardiology  Nephrology   Procedures:    Anti-Infectives:  Acyclovir 06/14/16---> 06/15/16 Rifaximin  06/14/16---> Rocephin 06/15/16---> 06/23/16  Subjective:   [name] continues to be lethargic. Was given a lactulose enema with good results per nursing staff. Unable to awaken enough to verbalize.  Objective:    Filed Vitals:   06/24/16 1157 06/24/16 2304 06/25/16 0705 06/25/16 0730  BP: 99/54 94/60 109/49   Pulse: 79 81 79   Temp: 98.3 F (36.8 C) 98.9 F (37.2 C) 98.4 F (36.9 C)   TempSrc: Oral Oral Oral   Resp: 21  19   Height:      Weight:    78.2 kg (172 lb 6.4 oz)  SpO2: 94% 95% 99%     Intake/Output Summary (Last 24 hours) at 06/25/16 0854 Last data filed at 06/25/16 0704  Gross per 24 hour  Intake    363 ml  Output   1600 ml  Net  -1237 ml   Filed Weights   06/23/16 0558 06/24/16 0447 06/25/16 0730  Weight: 80.287 kg (177 lb) 68.04 kg (150 lb) 78.2 kg (172 lb 6.4 oz)    Exam: General exam: Appears calm and comfortable. Lethargic. Respiratory system: Clear to auscultation. Respiratory effort normal. Cardiovascular system: S1 & S2 heard, RRR. No JVD,  rubs, gallops or clicks. No murmurs. Gastrointestinal system: Abdomen is nondistended, soft and nontender. No organomegaly or masses felt. Normal bowel sounds heard. Central nervous system: Somnolent. No focal neurological deficits. Extremities: No clubbing, edema, or cyanosis. Skin: Telangiectasias. Psychiatry: Lethargic, unable to assess.   Data Reviewed:   I have personally reviewed following labs and imaging studies:  Labs: Basic Metabolic Panel:  Recent Labs Lab 06/19/16 0425 06/20/16 0355 06/21/16 0244 06/22/16 0427 06/25/16 0223  NA 123* 122* 123* 125* 131*  K 4.0 4.2 4.6 4.6 4.8  CL 86* 86* 86* 88* 98*  CO2 25 26 27 27 26   GLUCOSE 101* 91 103* 94 114*  BUN 80* 81* 85* 86* 81*  CREATININE 2.43* 2.27* 2.28* 1.99* 2.15*  CALCIUM 8.9 8.8* 8.8* 8.8* 9.2  PHOS  --   --   --  2.7  --    GFR Estimated Creatinine Clearance: 31.1 mL/min (by C-G formula based on Cr of 2.15). Liver Function  Tests:  Recent Labs Lab 06/22/16 0427 06/25/16 0223  AST  --  37  ALT  --  26  ALKPHOS  --  160*  BILITOT  --  2.0*  PROT  --  4.8*  ALBUMIN 2.5* 2.3*    Recent Labs Lab 06/23/16 1110 06/25/16 0223  AMMONIA 167* 219*    CBC:  Recent Labs Lab 06/19/16 0425 06/20/16 0355 06/21/16 0244 06/22/16 0426 06/25/16 0223  WBC 7.1 5.8 6.7 5.9 6.0  HGB 7.8* 7.6* 7.6* 7.7* 7.5*  HCT 24.3* 23.1* 23.7* 23.8* 24.3*  MCV 89.0 89.9 91.2 92.2 94.6  PLT 64* 66* 82* 69* 65*   BNP (last 3 results)  Recent Labs  08/09/15 1621  PROBNP 188.0*   Microbiology Recent Results (from the past 240 hour(s))  Urine culture     Status: None   Collection Time: 06/15/16 10:59 AM  Result Value Ref Range Status   Specimen Description URINE, RANDOM  Final   Special Requests NONE  Final   Culture NO GROWTH  Final   Report Status 06/16/2016 FINAL  Final    Radiology:  Dg Chest 2 View  06/14/2016  CLINICAL DATA:  Increasing shortness of breath and swelling since this morning, CHF, cirrhosis, atrial fibrillation, pulmonary hypertension, history melanoma EXAM:  CHEST  2 VIEW COMPARISON:  05/01/2016 FINDINGS: LEFT subclavian transvenous pacemaker lead projects over RIGHT ventricle. Enlargement of cardiac silhouette with pulmonary vascular congestion. Atherosclerotic calcification aorta. Perihilar infiltrates greater on LEFT question asymmetric edema versus infection. Subsegmental atelectasis LEFT base. No pleural effusion or pneumothorax Bones demineralized. IMPRESSION: Enlargement of cardiac silhouette with pulmonary vascular congestion. Accentuated perihilar markings particularly on LEFT question asymmetric edema versus infection. Aortic atherosclerosis and pacemaker noted. Electronically Signed   By: Lavonia Dana M.D.   On: 06/14/2016 17:57   US Renal  06/15/2016  CLINICAL DATA:  Acute renal failure EXAM: RENAL / URINARY TRACT ULTRASOUND COMPLETE COMPARISON:  None. FINDINGS: Right Kidney: Length: 12.7  cm. Mildly echogenic right renal parenchyma. No right hydronephrosis. Questionable hyperechoic 0.8 x 0.6 x 0.9 cm renal cortical lesion in the upper right kidney (possibly artifactual due to junctional defect). Several tiny simple appearing renal cortical cysts in the right kidney, largest 1.2 x 1.0 x 1.0 cm in the interpolar right kidney. Left Kidney: Length: 12.2 cm. Mildly echogenic left renal parenchyma. No left hydronephrosis. Simple appearing small renal cortical cysts in the left kidney, largest 1.6 x 1.3 x 0.7 cm in the lateral left kidney. Bladder: Urinary bladder is collapsed by indwelling Foley catheter and cannot be evaluated on this scan. IMPRESSION: 1. No hydronephrosis. 2. Mildly echogenic kidneys, indicating nonspecific renal parenchymal disease of uncertain chronicity. 3. Questionable hyperechoic 0.9 cm renal cortical lesion in the upper right kidney, neoplasm not excluded. Recommend further evaluation with renal mass protocol MRI or CT abdomen without and with IV contrast on a short term outpatient basis. 4. Urinary bladder collapsed by indwelling Foley catheter and not evaluated on this scan. Electronically Signed   By: Ilona Sorrel M.D.   On: 06/15/2016 12:00    Medications:   . darbepoetin (ARANESP) injection - NON-DIALYSIS  100 mcg Subcutaneous Q Thu-1800  . digoxin  0.125 mg Oral Q M,W,F  . feeding supplement (ENSURE ENLIVE)  237 mL Oral BID BM  . ferrous sulfate  325 mg Oral BID WC  . lactulose  20 g Oral TID  . pantoprazole  40 mg Oral Daily  . rifaximin  550 mg Oral BID  . sodium chloride flush  3 mL Intravenous Q12H  . spironolactone  50 mg Oral Daily   And  . spironolactone  25 mg Oral QHS  . tamsulosin  0.4 mg Oral Daily  . torsemide  100 mg Oral Daily   Continuous Infusions:   Time spent: 25 minutes.   LOS: 11 days   [facility] Pager [phone]. If unable to reach me by pager, please call my cell phone at [phone].  *Please refer to  [email or web address], password [email or web address] to get updated schedule on who will round on this patient, as hospitalists switch teams weekly. If 7PM-7AM, please contact night-coverage at [email or web address], password [email or web address] for any overnight needs.  06/25/2016, 8:54 AM

## 2016-06-25 NOTE — Clinical Social Work Note (Signed)
Clinical Social Worker continuing to follow patient and family for support and discharge planning needs.  Patient with a medical decline (Ammonia 219 and more lethargic), therefore not stable for discharge.  Patient plan is to discharge to [facility] once medically appropriate.  CSW remains available for support and to facilitate patient discharge needs.  Hunter [name], [place]

## 2016-06-25 NOTE — Progress Notes (Signed)
Patient unresponsive to stimuli.  Ammonia level 219.  Lung sounds clear,  VSS.  Order received for Lactulose enema.  RN to call if assistance needed.

## 2016-06-25 NOTE — Progress Notes (Signed)
Patient is more lethargic,  difficult to arouse and  twitching at times. Patient Ammonia level 219. MD , Charge Nurse and Rapid response nurse  notified.

## 2016-06-26 ENCOUNTER — Telehealth: Payer: Self-pay | Admitting: Family Medicine

## 2016-06-26 LAB — BASIC METABOLIC PANEL
Anion gap: 10 (ref 5–15)
BUN: 87 mg/dL — AB (ref 6–20)
CALCIUM: 9.1 mg/dL (ref 8.9–10.3)
CO2: 23 mmol/L (ref 22–32)
CREATININE: 2.4 mg/dL — AB (ref 0.61–1.24)
Chloride: 101 mmol/L (ref 101–111)
GFR calc non Af Amer: 24 mL/min — ABNORMAL LOW (ref >60)
GFR, EST AFRICAN AMERICAN: 28 mL/min — AB (ref >60)
Glucose, Bld: 92 mg/dL (ref 65–99)
Potassium: 5.2 mmol/L — ABNORMAL HIGH (ref 3.5–5.1)
Sodium: 134 mmol/L — ABNORMAL LOW (ref 135–145)

## 2016-06-26 MED ORDER — LACTULOSE ENEMA
300.0000 mL | Freq: Four times a day (QID) | ORAL | Status: DC
  Administered 2016-06-26 – 2016-06-28 (×4): 300 mL via RECTAL
  Filled 2016-06-26 (×10): qty 300

## 2016-06-26 MED ORDER — LACTULOSE 10 GM/15ML PO SOLN
30.0000 g | Freq: Three times a day (TID) | ORAL | Status: DC
  Administered 2016-06-27 – 2016-06-28 (×4): 30 g via ORAL
  Filled 2016-06-26 (×4): qty 45

## 2016-06-26 NOTE — Progress Notes (Signed)
Initial Nutrition Assessment  INTERVENTION:  Diet advancement per MD Monitor goals of care Continue Ensure BID Add Pro-stat once daily when medically appropriate, provides 100 kcal and 15 grams of protein   NUTRITION DIAGNOSIS:   Inadequate oral intake related to chronic illness, lethargy/confusion as evidenced by NPO status.   GOAL:   Patient will meet greater than or equal to 90% of their needs   MONITOR:   Diet advancement, PO intake, Supplement acceptance, Labs, Skin, I & O's  REASON FOR ASSESSMENT:   Low [place], LOS    ASSESSMENT:   81 y.o. male with known systolic and diastolic HF Echo [id number] EF 45-50% NYHA class III, pulmonary hypertension, liver failure, CKD stage III, and hereditary hemorrhagic telangiectasia with hepatic AVMs and associated anemia requiring transfusion, now presented to [facility] ED with main concern of several days duration of progressively worsening dyspnea with exertion and at rest.   Pt asleep at time of visit. Wife at bedside reports that patient has not been eating since yesterday due to disorientation and lethargy. He has not been eating, drinking, or receiving pills by mouth. Per nursing notes, pt has been eating 0-50% of meals over the past week. Wife reports that patient doesn't have a big appetite, but was eating well/better PTA and drinks Ensure at home. RD offered support and will continue to monitor pt. Palliative care has been consulted.  Labs: elevated BUN/creatinine, low GFR, low sodium, low hemoglobin  Diet Order:  Diet Heart Room service appropriate?: Yes; Fluid consistency:: Thin Diet - low sodium heart healthy  Skin:  Wound (see comment) (Stage II pressure ulcer on buttocks)  Last BM:  7/13  Height:   Ht Readings from Last 1 Encounters:  06/14/16 6' (1.829 m)    Weight:   Wt Readings from Last 1 Encounters:  06/26/16 170 lb (77.111 kg)    Ideal Body Weight:  80.9 kg  BMI:  Body mass index is 23.05  kg/(m^2).  Estimated Nutritional Needs:   Kcal:  1800-2000  Protein:  85-95 grams  Fluid:  1 L restriction per MD  EDUCATION NEEDS:   No education needs identified at this time  [place], LDN Inpatient Clinical Dietitian Pager: [phone] After Hours Pager: [phone]

## 2016-06-26 NOTE — Progress Notes (Signed)
Progress Note    Hunter [name]  [name] DOB: 18-Oct-1935  DOA: 06/14/2016 PCP: Penni Homans, MD    Brief Narrative:   81 y.o. male with known systolic and diastolic HF Echo [id number] EF 45-50% NYHA class III, pulmonary hypertension, liver failure, CKD stage III, and hereditary hemorrhagic telangiectasia with hepatic AVMs and associated anemia requiring transfusion, now presented to [facility] ED with main concern of several days duration of progressively worsening dyspnea with exertion and at rest. He was admitted for acute on chronic combined systolic and diastolic CHF. Cardiology and nephrology teams Nephrology team consulted and recommendations given. Although it was hoped that the patient could be discharged on 06/23/16, the patient was hypotensive, lethargic, and encephalopathic with ammonia levels in the 160s. Lactulose dosing was increased. Continues to be encephalopathic.  Assessment/Plan:   Principal problems:  Acute on chronic combined systolic and diastolic CHF, NYHA class III, improving The patient's last ECHO was done in 2016 with EF 45% and appears unchanged based on repeat ECHO on this admission. He previously was on lasix 80 mg IV BID, changed to Torsemide 100 mg PO QD 06/17/16. Evaluated by nephrology with recommendations to continue torsemide and fluid restriction. No further recommendations per cardiology. Monitor weight and volume status. Continue digoxin. Patient's condition has deteriorated and he is not taking in any by mouth's including medications due to his severe encephalopathy. Will gently hydrate until he is awake and alert and able to take oral hydration.   Hepatic encephalopathy in the setting of chronic cirrhosis of the liver, chronic Repeat ammonia level elevated at 167 on 06/23/16, increased the lactulose to 20 mg TID. Continue rifaximin. Despite being given an extra dose of lactulose 06/24/16, the patient continues to be lethargic and difficult to arouse with a  rising ammonia level. Will give lactulose enemas BID until mentation improves and he can take oral medications. Beginning to wake up a little. Long discussion held with the patient's wife about his overall prognosis and fragile status. Agrees to speak with the palliative care team about goals of care and symptom management.  Active problems:   Right renal mass, stable 0.9 cm mass discovered incidentally on renal ultrasound. Neoplasm could not be excluded. Given current comorbidities, would avoid inpatient workup. If patient stabilizes as an outpatient, consider renal mass protocol MRI or CT of the abdomen with/without IV contrast for further evaluation.   Acute on chronic kidney disease stage III - IV/hyperkalemia, improving The patient's baseline GFR in 30-40's and baseline Cr 2 - 2.2. Initially elevated over usual baseline values with a creatinine as high as 3.4, now back to baseline values. Will need outpatient nephrology follow-up. Hyperkalemia was secondary to acute renal failure which has now resolved.   Hyponatremia, chronic/stable Secondary to cirrhosis and CHF physiology.    Chronic thrombocytopenia  Secondary to cirrhosis of the liver.   UTI with hematuria, resolved Status post 5 days therapy with Rocephin.    HHT (hereditary hemorrhagic telangiectasia) ([facility]), chronic   Anemia of chronic disease and chronic blood loss from hereditary hemorrhagic telangiectasia, chronic Monitor hemoglobin. Continue Aranesp and iron.   Buttock wound, right, ongoing  Pressure Ulcer stage 3 pressure injury present on admission. Continue present plan of care with Alginate to absorb moisture and promote healing, and foam dressing to attempt to protect from soiling. Pt is on a low-airloss bed to reduce pressure to the affected areas and increase airflow to bilat buttocks.  Family Communication/Anticipated D/C date and plan/Code Status  DVT prophylaxis: SCDs ordered. Code Status: DO NOT  RESUSCITATE Family Communication: Wife updated at the bedside. Disposition Plan: SNF 06/27/16 if more alert.   Medical Consultants:    Cardiology  Nephrology   Procedures:    Anti-Infectives:   Acyclovir 06/14/16---> 06/15/16 Rifaximin 06/14/16---> Rocephin 06/15/16---> 06/23/16  Subjective:   Hunter [name] continues to be lethargic, But now is able to open his eyes. Was given a lactulose enema with good results per nursing staff. Unable to awaken enough to verbalize to me, but the patient's wife says he has said a few words to her.  Objective:    Filed Vitals:   06/25/16 0730 06/25/16 1141 06/25/16 1945 06/26/16 0414  BP:  89/45 87/30 99/35   Pulse:  82 88 84  Temp:  97.8 F (36.6 C) 99.8 F (37.7 C) 97.9 F (36.6 C)  TempSrc:  Oral Oral Oral  Resp:  18 18 18   Height:      Weight: 78.2 kg (172 lb 6.4 oz)   77.111 kg (170 lb)  SpO2:  94% 92% 97%    Intake/Output Summary (Last 24 hours) at 06/26/16 1535 Last data filed at 06/26/16 1509  Gross per 24 hour  Intake 1138.75 ml  Output   1150 ml  Net -11.25 ml   Filed Weights   06/24/16 0447 06/25/16 0730 06/26/16 0414  Weight: 68.04 kg (150 lb) 78.2 kg (172 lb 6.4 oz) 77.111 kg (170 lb)    Exam: General exam: Appears calm and comfortable. Lethargic. Respiratory system: Clear to auscultation. Respiratory effort normal. Cardiovascular system: S1 & S2 heard, RRR. No JVD,  rubs, gallops or clicks. No murmurs. Gastrointestinal system: Abdomen is nondistended, soft and nontender. No organomegaly or masses felt. Normal bowel sounds heard. Central nervous system: Somnolent. No focal neurological deficits. Extremities: No clubbing, edema, or cyanosis. Skin: Telangiectasias. Psychiatry: Lethargic, unable to assess.   Data Reviewed:   I have personally reviewed following labs and imaging studies:  Labs: Basic Metabolic Panel:  Recent Labs Lab 06/20/16 0355 06/21/16 0244 06/22/16 0427 06/25/16 0223 06/26/16 0256    NA 122* 123* 125* 131* 134*  K 4.2 4.6 4.6 4.8 5.2*  CL 86* 86* 88* 98* 101  CO2 26 27 27 26 23   GLUCOSE 91 103* 94 114* 92  BUN 81* 85* 86* 81* 87*  CREATININE 2.27* 2.28* 1.99* 2.15* 2.40*  CALCIUM 8.8* 8.8* 8.8* 9.2 9.1  PHOS  --   --  2.7  --   --    GFR Estimated Creatinine Clearance: 27.7 mL/min (by C-G formula based on Cr of 2.4). Liver Function Tests:  Recent Labs Lab 06/22/16 0427 06/25/16 0223  AST  --  37  ALT  --  26  ALKPHOS  --  160*  BILITOT  --  2.0*  PROT  --  4.8*  ALBUMIN 2.5* 2.3*    Recent Labs Lab 06/23/16 1110 06/25/16 0223  AMMONIA 167* 219*    CBC:  Recent Labs Lab 06/20/16 0355 06/21/16 0244 06/22/16 0426 06/25/16 0223  WBC 5.8 6.7 5.9 6.0  HGB 7.6* 7.6* 7.7* 7.5*  HCT 23.1* 23.7* 23.8* 24.3*  MCV 89.9 91.2 92.2 94.6  PLT 66* 82* 69* 65*   BNP (last 3 results)  Recent Labs  08/09/15 1621  PROBNP 188.0*   Microbiology No results found for this or any previous visit (from the past 240 hour(s)).  Radiology:  Dg Chest 2 View  06/14/2016  CLINICAL DATA:  Increasing shortness of breath and swelling since  this morning, CHF, cirrhosis, atrial fibrillation, pulmonary hypertension, history melanoma EXAM: CHEST  2 VIEW COMPARISON:  05/01/2016 FINDINGS: LEFT subclavian transvenous pacemaker lead projects over RIGHT ventricle. Enlargement of cardiac silhouette with pulmonary vascular congestion. Atherosclerotic calcification aorta. Perihilar infiltrates greater on LEFT question asymmetric edema versus infection. Subsegmental atelectasis LEFT base. No pleural effusion or pneumothorax Bones demineralized. IMPRESSION: Enlargement of cardiac silhouette with pulmonary vascular congestion. Accentuated perihilar markings particularly on LEFT question asymmetric edema versus infection. Aortic atherosclerosis and pacemaker noted. Electronically Signed   By: Lavonia Dana M.D.   On: 06/14/2016 17:57   US Renal  06/15/2016  CLINICAL DATA:  Acute renal  failure EXAM: RENAL / URINARY TRACT ULTRASOUND COMPLETE COMPARISON:  None. FINDINGS: Right Kidney: Length: 12.7 cm. Mildly echogenic right renal parenchyma. No right hydronephrosis. Questionable hyperechoic 0.8 x 0.6 x 0.9 cm renal cortical lesion in the upper right kidney (possibly artifactual due to junctional defect). Several tiny simple appearing renal cortical cysts in the right kidney, largest 1.2 x 1.0 x 1.0 cm in the interpolar right kidney. Left Kidney: Length: 12.2 cm. Mildly echogenic left renal parenchyma. No left hydronephrosis. Simple appearing small renal cortical cysts in the left kidney, largest 1.6 x 1.3 x 0.7 cm in the lateral left kidney. Bladder: Urinary bladder is collapsed by indwelling Foley catheter and cannot be evaluated on this scan. IMPRESSION: 1. No hydronephrosis. 2. Mildly echogenic kidneys, indicating nonspecific renal parenchymal disease of uncertain chronicity. 3. Questionable hyperechoic 0.9 cm renal cortical lesion in the upper right kidney, neoplasm not excluded. Recommend further evaluation with renal mass protocol MRI or CT abdomen without and with IV contrast on a short term outpatient basis. 4. Urinary bladder collapsed by indwelling Foley catheter and not evaluated on this scan. Electronically Signed   By: Ilona Sorrel M.D.   On: 06/15/2016 12:00    Medications:   . darbepoetin (ARANESP) injection - NON-DIALYSIS  100 mcg Subcutaneous Q Thu-1800  . digoxin  0.125 mg Oral Q M,W,F  . feeding supplement (ENSURE ENLIVE)  237 mL Oral BID BM  . ferrous sulfate  325 mg Oral BID WC  . lactulose  30 g Oral TID  . lactulose  300 mL Rectal Q6H  . pantoprazole  40 mg Oral Daily  . rifaximin  550 mg Oral BID  . sodium chloride flush  3 mL Intravenous Q12H  . spironolactone  50 mg Oral Daily   And  . spironolactone  25 mg Oral QHS  . tamsulosin  0.4 mg Oral Daily  . torsemide  100 mg Oral Daily   Continuous Infusions: . sodium chloride 75 mL/hr at 06/26/16 0351     Time spent: 25 minutes.   LOS: 12 days   [facility] Pager [phone]. If unable to reach me by pager, please call my cell phone at [phone].  *Please refer to [email or web address], password [email or web address] to get updated schedule on who will round on this patient, as hospitalists switch teams weekly. If 7PM-7AM, please contact night-coverage at [email or web address], password [email or web address] for any overnight needs.  06/26/2016, 3:35 PM

## 2016-06-26 NOTE — Care Management Important Message (Signed)
Important Message  Patient Details  Name: Darrein Cornelison MRN: [id number] Date of Birth: [birth year]   Medicare Important Message Given:  Yes    [name] 06/26/2016, 2:57 PM

## 2016-06-26 NOTE — Telephone Encounter (Signed)
Hunter [name] from [facility] assisted living is requesting pt's immunization records.    Fax: [phone]

## 2016-06-27 DIAGNOSIS — Z66 Do not resuscitate: Secondary | ICD-10-CM | POA: Insufficient documentation

## 2016-06-27 DIAGNOSIS — Z515 Encounter for palliative care: Secondary | ICD-10-CM

## 2016-06-27 LAB — BASIC METABOLIC PANEL
Anion gap: 10 (ref 5–15)
BUN: 85 mg/dL — AB (ref 6–20)
CHLORIDE: 109 mmol/L (ref 101–111)
CO2: 23 mmol/L (ref 22–32)
CREATININE: 2.4 mg/dL — AB (ref 0.61–1.24)
Calcium: 9.2 mg/dL (ref 8.9–10.3)
GFR calc Af Amer: 28 mL/min — ABNORMAL LOW (ref >60)
GFR calc non Af Amer: 24 mL/min — ABNORMAL LOW (ref >60)
GLUCOSE: 88 mg/dL (ref 65–99)
POTASSIUM: 4.5 mmol/L (ref 3.5–5.1)
Sodium: 142 mmol/L (ref 135–145)

## 2016-06-27 MED ORDER — DARBEPOETIN ALFA 100 MCG/0.5ML IJ SOSY
100.0000 ug | PREFILLED_SYRINGE | INTRAMUSCULAR | Status: DC
  Administered 2016-06-27: 100 ug via SUBCUTANEOUS
  Filled 2016-06-27: qty 0.5

## 2016-06-27 NOTE — Progress Notes (Signed)
Physical Therapy Treatment Patient Details Name: Hunter [name] MRN: [id number] DOB: 1935-02-20 Today's Date: 06/27/2016    History of Present Illness Pt is a 81 y/o M who presented with SOB.  Although it was hoped that the patient could be discharged on 06/23/16, the patient was hypotensive, lethargic, and encephalopathic with ammonia levels in the 160s. Lactulose dosing was increased. Continues to be encephalopathic.  Pt's PMH inlcudes recurrent epistaxis, chronic anemia due to chronic blood loss (recently admitted secondary to vomiting bright red blood), hepatic AVM, HF, s/p pacemaker, recurrent hepatic encephalopathy, CKD.  Pt with UTI, CXR with pulmonary vascular congestion.    PT Comments    Hunter [name] made good progress today as he was more alert and engaged.  He required +2 min assist for bed mobility and for transfers.  Pt will benefit from continued skilled PT services to increase functional independence and safety.   Follow Up Recommendations  SNF;Supervision/Assistance - 24 hour     Equipment Recommendations  Other (comment) (TBD at next venue of care)    Recommendations for Other Services OT consult     Precautions / Restrictions Precautions Precautions: Fall Restrictions Weight Bearing Restrictions: No    Mobility  Bed Mobility Overal bed mobility: Needs Assistance;+2 for physical assistance Bed Mobility: Supine to Sit   Sidelying to sit: HOB elevated;Mod assist Supine to sit: Min assist;+2 for physical assistance;HOB elevated     General bed mobility comments: Assist to elevate trunk and to manage LEs.    Transfers Overall transfer level: Needs assistance Equipment used: Rolling walker (2 wheeled) Transfers: Sit to/from [facility] Sit to Stand: Min assist;+2 physical assistance;From elevated surface Stand pivot transfers: Min assist;+2 safety/equipment;+2 physical assistance       General transfer comment: Assist to boost up to standing  and to steady with cues provided for hand placement.  Pt stands at bedside with min guard assist for ~2 minutes for pericare.  Directional cues and assist to steady to pivot to chair.  Ambulation/Gait             General Gait Details: deferred due to pt fatigue   Stairs            Wheelchair Mobility    Modified Rankin (Stroke Patients Only)       Balance Overall balance assessment: Needs assistance Sitting-balance support: No upper extremity supported;Feet supported Sitting balance-Leahy Scale: Poor Sitting balance - Comments: 1 UE supported while sitting EOB with min guard assist for safety   Standing balance support: Bilateral upper extremity supported;During functional activity Standing balance-Leahy Scale: Poor Standing balance comment: Relies on support from RW to remain steady standing statically                    Cognition Arousal/Alertness: Lethargic;Awake/alert Behavior During Therapy: Flat affect Overall Cognitive Status: Impaired/Different from baseline Area of Impairment: Safety/judgement;Problem solving;Awareness;Orientation;Attention;Memory;Following commands Orientation Level: Disoriented to;Time;Situation Current Attention Level: Sustained Memory: Decreased short-term memory Following Commands: Follows one step commands with increased time;Follows one step commands inconsistently Safety/Judgement: Decreased awareness of safety;Decreased awareness of deficits Awareness: Intellectual Problem Solving: Slow processing;Decreased initiation;Difficulty sequencing;Requires verbal cues General Comments: Pt requires increased time to answer questions and does not always provide response.  Pt lethargic at start of session but keeps eyes opened once sitting EOB.    Exercises General Exercises - Upper Extremity Shoulder Flexion: AROM;Both;5 reps;Seated General Exercises - Lower Extremity Long Arc Quad: AROM;Both;5 reps;Seated    General Comments  General comments (skin integrity,  edema, etc.): BP 103/37 supine at start of session.  Pt denies dizziness.  Notified RN at end of session that buttocks pink foam dressing needs to replaced as pt had small BM (was cleaned up) and it was removed.      Pertinent Vitals/Pain Pain Assessment: No/denies pain Pain Score: 0-No pain Pain Intervention(s): Monitored during session;Limited activity within patient's tolerance;Repositioned    Home Living                      Prior Function            PT Goals (current goals can now be found in the care plan section) Acute Rehab PT Goals Patient Stated Goal: none stated PT Goal Formulation: With patient Time For Goal Achievement: 07/02/16 Potential to Achieve Goals: Fair Progress towards PT goals: Progressing toward goals    Frequency  Min 2X/week    PT Plan Current plan remains appropriate    Co-evaluation             End of Session Equipment Utilized During Treatment: Gait belt Activity Tolerance: Patient limited by fatigue Patient left: with call bell/phone within reach;with family/visitor present;in chair;with chair alarm set;Other (comment) (with pillow in chair )     Time: 1000-1034 PT Time Calculation (min) (ACUTE ONLY): 34 min  Charges:  $Therapeutic Activity: 23-37 mins                    G Codes:      Hunter [name] PT, DPT  Pager: [phone] Phone: [phone] 06/27/2016, 11:09 AM

## 2016-06-27 NOTE — Telephone Encounter (Signed)
Nurse assisted. Not showing vaccs on file that are being requested.    Deneise Lever expressed understanding.     Thanks for further assistance

## 2016-06-27 NOTE — Progress Notes (Signed)
Progress Note    Hunter [name]  [name] DOB: 07/24/1935  DOA: 06/14/2016 PCP: Penni Homans, MD    Brief Narrative:   81 y.o. male with known systolic and diastolic HF Echo [id number] EF 45-50% NYHA class III, pulmonary hypertension, liver failure, CKD stage III, and hereditary hemorrhagic telangiectasia with hepatic AVMs and associated anemia requiring transfusion, now presented to [facility] ED with main concern of several days duration of progressively worsening dyspnea with exertion and at rest. He was admitted for acute on chronic combined systolic and diastolic CHF. Cardiology and nephrology teams Nephrology team consulted and recommendations given. Although it was hoped that the patient could be discharged on 06/23/16, the patient was hypotensive, lethargic, and encephalopathic with ammonia levels in the 160s. Lactulose dosing was increased. Continues to be encephalopathic.  Assessment/Plan:   Principal problems:  Acute on chronic combined systolic and diastolic CHF, NYHA class III, improving The patient's last ECHO was done in 2016 with EF 45% and appears unchanged based on repeat ECHO on this admission. He previously was on lasix 80 mg IV BID, changed to Torsemide 100 mg PO QD 06/17/16. Evaluated by nephrology with recommendations to continue torsemide and fluid restriction. No further recommendations per cardiology. Monitor weight and volume status. Continue digoxin. Patient's condition has deteriorated and he is not taking in any by mouth's including medications due to his severe encephalopathy. More alert today, but still encephalopathic.   Hepatic encephalopathy in the setting of chronic cirrhosis of the liver, chronic Repeat ammonia level elevated at 167 on 06/23/16, increased the lactulose to 20 mg TID. Continue rifaximin. Despite being given an extra dose of lactulose 06/24/16, the patient continues to be lethargic and difficult to arouse with a rising ammonia level. Will give  lactulose enemas BID until mentation improves and he can take oral medications. Beginning to wake up a little. Long discussion held with the patient's wife about his overall prognosis and fragile status. Agrees to speak with the palliative care team about goals of care and symptom management.  Active problems:   Right renal mass, stable 0.9 cm mass discovered incidentally on renal ultrasound. Neoplasm could not be excluded. Given current comorbidities, would avoid inpatient workup. If patient stabilizes as an outpatient, consider renal mass protocol MRI or CT of the abdomen with/without IV contrast for further evaluation.   Acute on chronic kidney disease stage III - IV/hyperkalemia, improving The patient's baseline GFR in 30-40's and baseline Cr 2 - 2.2. Initially elevated over usual baseline values with a creatinine as high as 3.4, now back to baseline values. Will need outpatient nephrology follow-up. Hyperkalemia was secondary to acute renal failure which has now resolved.   Hyponatremia, chronic/stable Secondary to cirrhosis and CHF physiology.    Chronic thrombocytopenia  Secondary to cirrhosis of the liver.   UTI with hematuria, resolved Status post 5 days therapy with Rocephin.    HHT (hereditary hemorrhagic telangiectasia) ([place]), chronic   Anemia of chronic disease and chronic blood loss from hereditary hemorrhagic telangiectasia, chronic Monitor hemoglobin. Continue Aranesp and iron.   Buttock wound, right, ongoing  Pressure Ulcer stage 3 pressure injury present on admission. Continue present plan of care with Alginate to absorb moisture and promote healing, and foam dressing to attempt to protect from soiling. Pt is on a low-airloss bed to reduce pressure to the affected areas and increase airflow to bilat buttocks.  Family Communication/Anticipated D/C date and plan/Code Status   DVT prophylaxis: SCDs ordered. Code Status: DO NOT RESUSCITATE  Family Communication:  Wife updated at the bedside. Disposition Plan: SNF 06/28/16 if more alert.   Medical Consultants:    Cardiology  Nephrology   Procedures:    Anti-Infectives:   Acyclovir 06/14/16---> 06/15/16 Rifaximin 06/14/16---> Rocephin 06/15/16---> 06/23/16  Subjective:   Hunter [name] continues to be lethargic, but now is conversant though slow to respond. Knows he is in the hospital and who the president is, but unaware of the date or month.  Objective:    Filed Vitals:   06/25/16 1945 06/26/16 0414 06/26/16 1951 06/27/16 0508  BP: 87/30 99/35 106/46 115/45  Pulse: 88 84 82 86  Temp: 99.8 F (37.7 C) 97.9 F (36.6 C) 98.3 F (36.8 C) 98 F (36.7 C)  TempSrc: Oral Oral Oral Oral  Resp: 18 18 18 18   Height:      Weight:  77.111 kg (170 lb)  75.841 kg (167 lb 3.2 oz)  SpO2: 92% 97% 96% 98%    Intake/Output Summary (Last 24 hours) at 06/27/16 0840 Last data filed at 06/27/16 0100  Gross per 24 hour  Intake      0 ml  Output   1275 ml  Net  -1275 ml   Filed Weights   06/25/16 0730 06/26/16 0414 06/27/16 0508  Weight: 78.2 kg (172 lb 6.4 oz) 77.111 kg (170 lb) 75.841 kg (167 lb 3.2 oz)    Exam: General exam: Appears calm and comfortable.  Respiratory system: Clear to auscultation. Respiratory effort normal. Cardiovascular system: S1 & S2 heard, RRR. No JVD,  rubs, gallops or clicks. No murmurs. Gastrointestinal system: Abdomen is nondistended, soft and nontender. No organomegaly or masses felt. Normal bowel sounds heard. Central nervous system: Awake but still slower to respond than normal. No focal neurological deficits.Oriented to self and place. Extremities: No clubbing, edema, or cyanosis. Skin: Telangiectasias. Psychiatry: Flat affect.   Data Reviewed:   I have personally reviewed following labs and imaging studies:  Labs: Basic Metabolic Panel:  Recent Labs Lab 06/21/16 0244 06/22/16 0427 06/25/16 0223 06/26/16 0256 06/27/16 0213  NA 123* 125* 131* 134* 142   K 4.6 4.6 4.8 5.2* 4.5  CL 86* 88* 98* 101 109  CO2 27 27 26 23 23   GLUCOSE 103* 94 114* 92 88  BUN 85* 86* 81* 87* 85*  CREATININE 2.28* 1.99* 2.15* 2.40* 2.40*  CALCIUM 8.8* 8.8* 9.2 9.1 9.2  PHOS  --  2.7  --   --   --    GFR Estimated Creatinine Clearance: 27.2 mL/min (by C-G formula based on Cr of 2.4). Liver Function Tests:  Recent Labs Lab 06/22/16 0427 06/25/16 0223  AST  --  37  ALT  --  26  ALKPHOS  --  160*  BILITOT  --  2.0*  PROT  --  4.8*  ALBUMIN 2.5* 2.3*    Recent Labs Lab 06/23/16 1110 06/25/16 0223  AMMONIA 167* 219*    CBC:  Recent Labs Lab 06/21/16 0244 06/22/16 0426 06/25/16 0223  WBC 6.7 5.9 6.0  HGB 7.6* 7.7* 7.5*  HCT 23.7* 23.8* 24.3*  MCV 91.2 92.2 94.6  PLT 82* 69* 65*   BNP (last 3 results)  Recent Labs  08/09/15 1621  PROBNP 188.0*   Microbiology No results found for this or any previous visit (from the past 240 hour(s)).  Radiology:  Dg Chest 2 View  06/14/2016  CLINICAL DATA:  Increasing shortness of breath and swelling since this morning, CHF, cirrhosis, atrial fibrillation, pulmonary hypertension, history melanoma EXAM:  CHEST  2 VIEW COMPARISON:  05/01/2016 FINDINGS: LEFT subclavian transvenous pacemaker lead projects over RIGHT ventricle. Enlargement of cardiac silhouette with pulmonary vascular congestion. Atherosclerotic calcification aorta. Perihilar infiltrates greater on LEFT question asymmetric edema versus infection. Subsegmental atelectasis LEFT base. No pleural effusion or pneumothorax Bones demineralized. IMPRESSION: Enlargement of cardiac silhouette with pulmonary vascular congestion. Accentuated perihilar markings particularly on LEFT question asymmetric edema versus infection. Aortic atherosclerosis and pacemaker noted. Electronically Signed   By: Lavonia Dana M.D.   On: 06/14/2016 17:57   US Renal  06/15/2016  CLINICAL DATA:  Acute renal failure EXAM: RENAL / URINARY TRACT ULTRASOUND COMPLETE COMPARISON:   None. FINDINGS: Right Kidney: Length: 12.7 cm. Mildly echogenic right renal parenchyma. No right hydronephrosis. Questionable hyperechoic 0.8 x 0.6 x 0.9 cm renal cortical lesion in the upper right kidney (possibly artifactual due to junctional defect). Several tiny simple appearing renal cortical cysts in the right kidney, largest 1.2 x 1.0 x 1.0 cm in the interpolar right kidney. Left Kidney: Length: 12.2 cm. Mildly echogenic left renal parenchyma. No left hydronephrosis. Simple appearing small renal cortical cysts in the left kidney, largest 1.6 x 1.3 x 0.7 cm in the lateral left kidney. Bladder: Urinary bladder is collapsed by indwelling Foley catheter and cannot be evaluated on this scan. IMPRESSION: 1. No hydronephrosis. 2. Mildly echogenic kidneys, indicating nonspecific renal parenchymal disease of uncertain chronicity. 3. Questionable hyperechoic 0.9 cm renal cortical lesion in the upper right kidney, neoplasm not excluded. Recommend further evaluation with renal mass protocol MRI or CT abdomen without and with IV contrast on a short term outpatient basis. 4. Urinary bladder collapsed by indwelling Foley catheter and not evaluated on this scan. Electronically Signed   By: Ilona Sorrel M.D.   On: 06/15/2016 12:00    Medications:   . darbepoetin (ARANESP) injection - NON-DIALYSIS  100 mcg Subcutaneous Q Thu-1800  . digoxin  0.125 mg Oral Q M,W,F  . feeding supplement (ENSURE ENLIVE)  237 mL Oral BID BM  . ferrous sulfate  325 mg Oral BID WC  . lactulose  30 g Oral TID  . lactulose  300 mL Rectal Q6H  . pantoprazole  40 mg Oral Daily  . rifaximin  550 mg Oral BID  . sodium chloride flush  3 mL Intravenous Q12H  . spironolactone  50 mg Oral Daily   And  . spironolactone  25 mg Oral QHS  . tamsulosin  0.4 mg Oral Daily  . torsemide  100 mg Oral Daily   Continuous Infusions: . sodium chloride 75 mL/hr at 06/26/16 0351    Time spent: 25 minutes.   LOS: 13 days   [place]  [facility] Pager [phone]. If unable to reach me by pager, please call my cell phone at [phone].  *Please refer to [email or web address], password [email or web address] to get updated schedule on who will round on this patient, as hospitalists switch teams weekly. If 7PM-7AM, please contact night-coverage at [email or web address], password [email or web address] for any overnight needs.  06/27/2016, 8:40 AM

## 2016-06-27 NOTE — Consult Note (Signed)
Consultation Note Date: 06/27/2016   Patient Name: Hunter [name]  DOB: 1935-06-30  MRN: [id number]  Age / Sex: 81 y.o., male  PCP: Hunter Lukes, MD Referring Physician: Venetia Maxon Rama, MD  Reason for Consultation: Establishing goals of care  HPI/Patient Profile: 81 y.o. male  with past medical history of systolic and diastolic HF Echo 03/19/80 EF 45-50% NYHA class III, pulmonary hypertension, liver failure, CKD stage III, and hereditary hemorrhagic telangiectasia with hepatic AVMs and associated anemia requiring transfusion admitted on 06/14/2016 with worsening dyspnea and exertion at rest. Treated for acute on chronic diastolic and systolic heart failure. Seemed to be making some improvement and be moving towards discharge when he became severely encephalopathic with ammonia 216, unresponsive, hypotensive.   Clinical Assessment and Goals of Care: I met today with wife, Hunter [name], and Hunter [name] son. Hunter [name] was too lethargic to participate. I have met with Hunter [name] previously ~1 year ago. Fortunately they were able to have some good time but Hunter [name] says that he has been on a gradual decline since this spring. He was able to fulfil his goal to take his wife out to dinner as he expressed last time we met. Hunter [name] has been caring for him at home but it has been increasingly more difficult, especially his his lactulose and stools but he was able to ambulate prior to admission but required much assistance with his care.   We discuss the various options of where to go from here although I do believe hospice would be appropriate. They report that he has awakened more today and was talking some to them this morning. We agree to re-evaluate tomorrow to see if there will be any further improvement. In best case scenario he is still regarded to have very poor prognosis and family understands this - I encourage them to consider even with  further improvement how aggressive they would want to be. I do recommend given declining QOL and previous conversations that focus on comfort care would be appropriate (even with some improvement). They recognize barriers to lengthen his life including likely inability to maintain adequate intake. They are open to hospice options but wife cannot take him home and care for him. Will likely be a candidate for hospice facility. Family agree for another 24-48 hours to assess the best disposition for him (given he did wake up some today - this makes this decision more complicated for them). I will follow up tomorrow.   HCPOA wife    SUMMARY OF RECOMMENDATIONS   Will remeet tomorrow Likely candidate for hospice facility  Code Status/Advance Care Planning:  DNR   Symptom Management:   Encephalopathy: Continue lactulose po vs enema for now.   No signs of discomfort.   Palliative Prophylaxis:   Bowel Regimen, Delirium Protocol, Frequent Pain Assessment, Oral Care, Palliative Wound Care and Turn Reposition  Additional Recommendations (Limitations, Scope, Preferences):  No Artificial Feeding and No Surgical Procedures  Psycho-social/Spiritual:   Desire for further Chaplaincy support:yes  Additional Recommendations: Caregiving  Support/Resources and Education on  Hospice  Prognosis:   To be determined. Certainly less than 6 months. Very potentially < 2 weeks but will reassess for any progress tomorrow.   Discharge Planning: To Be Determined      Primary Diagnoses: Present on Admission:  . Anemia due to chronic blood loss . Cardiac cirrhosis . HHT (hereditary hemorrhagic telangiectasia) ([place]) . Hyponatremia . Acute on chronic renal failure ([place]) . Hyperkalemia . (Resolved) Acute on chronic diastolic (congestive) heart failure ([place]) . (Resolved) Acute diastolic CHF (congestive heart failure) ([place]) . Pressure ulcer . Renal mass, right . Acute on chronic combined systolic and  diastolic CHF (congestive heart failure) ([place]) . Cirrhosis ([place]) . Anemia of chronic disease  I have reviewed the medical record, interviewed the patient and family, and examined the patient. The following aspects are pertinent.  Past Medical History  Diagnosis Date  . CHF (congestive heart failure) ([place])   . Pacemaker     single lead Medtronic pacemaker  . HHT (hereditary hemorrhagic telangiectasia) ([place])   . Anemia   . Pulmonary hypertension ([place])   . 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 Renal insufficiency   . Cirrhosis of liver ([place])     felt to have cardiac cirrhosis.   . Atrial fibrillation ([place])   . Hepatic AV fistula ([place]) 08/09/2015  . 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 Anemia due to chronic blood loss 08/09/2015  . Right heart failure ([place]) 08/09/2015  . Tricuspid regurgitation 08/09/2015  . Melanoma ([place])     2 on nose, 1 on back, 1 on leg  . Diarrhea 05/09/2016  . Nausea without vomiting 06/11/2016   Social History   Social History  . Marital Status: Married    Spouse Name: N/A  . Number of Children: 1  . Years of Education: N/A   Occupational History  . retired    Social History Main Topics  . Smoking status: Never Smoker   . Smokeless tobacco: Never Used  . Alcohol Use: No  . Drug Use: No  . 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 None   Social History Narrative   Married 1 son   Retired Therapist, art   One caffeinated beverage a day no alcohol   He was living in [employer] at the [address] on [address] met in [place] and has located to [place] recently.   08/09/2015      Family History  Problem Relation Age of Onset  . Other Mother     HHT  . Other Son     HHT  . Heart failure Father   . Heart disease Sister     afib, pacer  . Arthritis Brother     b/l TKR  . Other Brother   . Heart disease  Sister     pacer   Scheduled Meds: . darbepoetin (ARANESP) injection - NON-DIALYSIS  100 mcg Subcutaneous Q Thu-1800  . digoxin  0.125 mg Oral Q M,W,F  . feeding supplement (ENSURE ENLIVE)  237 mL Oral BID BM  . ferrous sulfate  325 mg Oral BID WC  . lactulose  30 g Oral TID  . lactulose  300 mL Rectal Q6H  . pantoprazole  40 mg Oral Daily  . rifaximin  550 mg Oral BID  . sodium chloride flush  3 mL Intravenous Q12H  . spironolactone  50 mg Oral  Daily   And  . spironolactone  25 mg Oral QHS  . tamsulosin  0.4 mg Oral Daily  . torsemide  100 mg Oral Daily   Continuous Infusions: . sodium chloride 75 mL/hr at 06/26/16 0351   PRN Meds:.acetaminophen **OR** acetaminophen, Gerhardt's butt cream, ondansetron **OR** ondansetron (ZOFRAN) IV Medications Prior to Admission:  Prior to Admission medications   Medication Sig Start Date End Date Taking? Authorizing Provider  b complex-vitamin c-folic acid (NEPHRO-VITE) 0.8 MG TABS tablet Take 1 tablet by mouth daily. 06/01/16  Yes Bradd Canary, MD  cyanocobalamin (,VITAMIN B-12,) 1000 MCG/ML injection Inject 1,000 mcg into the muscle every 30 (thirty) days. 1st of every month   Yes Historical Provider, MD  digoxin (LANOXIN) 0.125 MG tablet Take 1 tablet (0.125 mg total) by mouth 3 (three) times a week. Mon, Wed, Fri Patient taking differently: Take 0.125 mg by mouth every Monday, Wednesday, and Friday. Mon, Wed, Fri 05/09/16  Yes Bradd Canary, MD  feeding supplement, ENSURE ENLIVE, (ENSURE ENLIVE) LIQD Take 237 mLs by mouth 2 (two) times daily between meals. 08/15/15  Yes Jamal Collin, MD  ferrous sulfate 325 (65 FE) MG EC tablet Take 325 mg by mouth 2 (two) times daily with a meal.    Yes Historical Provider, MD  Hydrocortisone (GERHARDT'S BUTT CREAM) CREA Apply 1 application topically 3 (three) times daily. Patient taking differently: Apply 1 application topically 3 (three) times daily as needed for irritation.  05/31/16  Yes Bradd Canary, MD   KLOR-CON M20 20 MEQ tablet TAKE TWO TABLETS BY MOUTH IN THE MORNING, THEN TAKE ONE IN THE EVENING 06/07/16  Yes Dolores Patty, MD  lactulose (CHRONULAC) 10 GM/15ML solution Take 45 mLs (30 g total) by mouth 3 (three) times daily. Titrate to 3-4 BM's day Patient taking differently: Take 30 g by mouth 3 (three) times daily. Titrate to 2-3 BM's day 04/23/16  Yes Ripudeep Jenna Luo, MD  Magnesium 250 MG TABS Take 250 mg by mouth daily.    Yes Historical Provider, MD  metolazone (ZAROXOLYN) 2.5 MG tablet Take 1 tablet (2.5 mg total) by mouth as needed (for weight of 171 lb or greater). 03/27/16  Yes Dolores Patty, MD  ondansetron (ZOFRAN) 4 MG tablet Take 1 tablet (4 mg total) by mouth every 8 (eight) hours as needed for nausea or vomiting. 05/05/16  Yes Richarda Overlie, MD  oxymetazoline (AFRIN) 0.05 % nasal spray Place 1 spray into both nostrils 2 (two) times daily. Patient taking differently: Place 1 spray into both nostrils 2 (two) times daily as needed for congestion.  05/05/16  Yes Richarda Overlie, MD  pantoprazole (PROTONIX) 40 MG tablet TAKE ONE TABLET BY MOUTH ONCE DAILY 05/24/16  Yes Dolores Patty, MD  rifaximin (XIFAXAN) 550 MG TABS tablet Take 1 tablet (550 mg total) by mouth 2 (two) times daily. 05/05/16  Yes Richarda Overlie, MD  saline (AYR) GEL Place 1 application into the nose 2 (two) times daily as needed (for nose bleeds).   Yes Historical Provider, MD  spironolactone (ALDACTONE) 50 MG tablet Take 50 mg (1 tab) in am and 25 mg (1/2 tab) in pm. Patient taking differently: Take 25-50 mg by mouth 2 (two) times daily. Take 50 mg (1 tab) in am and 25 mg (1/2 tab) in pm. 03/20/16  Yes Dolores Patty, MD  feeding supplement, ENSURE ENLIVE, (ENSURE ENLIVE) LIQD Take 237 mLs by mouth 2 (two) times daily between meals. 05/05/16   Germain Osgood  Abrol, MD  lactulose (CHRONULAC) 10 GM/15ML solution TAKE 45MLS BY MOUTH TWICE DAILY 06/05/16   Hunter Lukes, MD  promethazine (PHENERGAN) 12.5 MG tablet Take 0.5  tablets (6.25 mg total) by mouth every 8 (eight) hours as needed for nausea or vomiting. Patient taking differently: Take 6.25 mg by mouth daily as needed for nausea or vomiting.  04/23/16   Ripudeep Krystal Eaton, MD  SYRINGE-NEEDLE, DISP, 3 ML 25G X 5/8" 3 ML MISC To use with B12 injections 03/09/16   Hunter Lukes, MD  torsemide (DEMADEX) 100 MG tablet Take 1 tablet (100 mg total) by mouth daily. 06/22/16   Theodis Blaze, MD   No Known Allergies Review of Systems  Unable to perform ROS: Mental status change    Physical Exam  Constitutional: He appears well-developed. He appears lethargic.  HENT:  Head: Normocephalic and atraumatic.  Cardiovascular: Normal rate.   Pulmonary/Chest: Effort normal. No accessory muscle usage. No tachypnea. No respiratory distress.  Abdominal: Soft. Normal appearance.  Neurological: He appears lethargic.    Vital Signs: BP 114/47 mmHg  Pulse 82  Temp(Src) 98 F (36.7 C) (Oral)  Resp 16  Ht 6' (1.829 m)  Wt 75.841 kg (167 lb 3.2 oz)  BMI 22.67 kg/m2  SpO2 100% Pain Assessment: Faces   Pain Score: 0-No pain   SpO2: SpO2: 100 % O2 Device:SpO2: 100 % O2 Flow Rate: .   IO: Intake/output summary:  Intake/Output Summary (Last 24 hours) at 06/27/16 1452 Last data filed at 06/27/16 1227  Gross per 24 hour  Intake    580 ml  Output   1525 ml  Net   -945 ml    LBM: Last BM Date: 06/27/16 Baseline Weight: Weight: 82.7 kg (182 lb 5.1 oz) Most recent weight: Weight: 75.841 kg (167 lb 3.2 oz)     Palliative Assessment/Data:   Flowsheet Rows        Most Recent Value   Intake Tab    Referral Department  Hospitalist   Unit at Time of Referral  Cardiac/Telemetry Unit   Palliative Care Primary Diagnosis  Cardiac   Date Notified  06/26/16   Palliative Care Type  Return patient Palliative Care   Reason for referral  Clarify Goals of Care   Date of Admission  06/14/16   # of days IP prior to Palliative referral  12   Clinical Assessment    Psychosocial &  Spiritual Assessment    Palliative Care Outcomes       Time In: 1350 Time Out: 1500 Time Total: 86mn Greater than 50%  of this time was spent counseling and coordinating care related to the above assessment and plan.  Signed by: PPershing Proud NP   Please contact Palliative Medicine Team phone at [phone] questions and concerns.  For individual provider: See AShea Evans

## 2016-06-28 DIAGNOSIS — K7460 Unspecified cirrhosis of liver: Secondary | ICD-10-CM

## 2016-06-28 DIAGNOSIS — N2889 Other specified disorders of kidney and ureter: Secondary | ICD-10-CM

## 2016-06-28 DIAGNOSIS — K7290 Hepatic failure, unspecified without coma: Secondary | ICD-10-CM

## 2016-06-28 DIAGNOSIS — I5043 Acute on chronic combined systolic (congestive) and diastolic (congestive) heart failure: Secondary | ICD-10-CM

## 2016-06-28 LAB — BASIC METABOLIC PANEL
Anion gap: 8 (ref 5–15)
BUN: 76 mg/dL — AB (ref 6–20)
CALCIUM: 8.7 mg/dL — AB (ref 8.9–10.3)
CO2: 22 mmol/L (ref 22–32)
CREATININE: 2.17 mg/dL — AB (ref 0.61–1.24)
Chloride: 110 mmol/L (ref 101–111)
GFR calc Af Amer: 32 mL/min — ABNORMAL LOW (ref >60)
GFR, EST NON AFRICAN AMERICAN: 27 mL/min — AB (ref >60)
GLUCOSE: 99 mg/dL (ref 65–99)
Potassium: 3.8 mmol/L (ref 3.5–5.1)
SODIUM: 140 mmol/L (ref 135–145)

## 2016-06-28 MED ORDER — PHENOL 1.4 % MT LIQD: 1.0000 | OROMUCOSAL | Status: DC | PRN

## 2016-06-28 MED ORDER — SODIUM CHLORIDE 0.9 % IV BOLUS (SEPSIS)
500.0000 mL | Freq: Once | INTRAVENOUS | Status: AC
  Administered 2016-06-28: 500 mL via INTRAVENOUS

## 2016-06-28 MED ORDER — LACTULOSE 10 GM/15ML PO SOLN
45.0000 g | Freq: Four times a day (QID) | ORAL | Status: DC
  Filled 2016-06-28: qty 90

## 2016-06-28 MED ORDER — LACTULOSE ENEMA
300.0000 mL | Freq: Once | ORAL | Status: DC
  Filled 2016-06-28: qty 300

## 2016-06-28 NOTE — Progress Notes (Signed)
Daily Progress Note   Patient Name: Hunter [name]       Date: 06/28/2016 DOB: 12-11-1935  Age: 81 y.o. MRN#: [id number] Attending Physician: Hunter Patricia, MD Primary Care Physician: Hunter Homans, MD Admit Date: 06/14/2016  Reason for Consultation/Follow-up: Establishing goals of care  Subjective: I met again with Hunter [name] wife, Hunter [name]. He continues to be very lethargic but is taking po meds and will take a little bit of liquid intake. Hunter [name] is tearful as she feels that she has come to the realization that this is no QOL. We observe what he would tell us if he were able to speak and she does not believe that he would like to continue in this condition. He was a very active person and has not been able to enjoy the life activities that he used too. We feel like he has had some good QOL when ~1 year ago we were not sure that was possible. Hunter [name] believes that transition to hospice will be best for him at this point but wishes to speak with his biological son before making final decision. Emotional support and reassurance that I believe this is a good decision for Hunter [name] was provided. I will follow up in morning and will speak with son and/or help with transition to hospice/comfort care.   Length of Stay: 14  Current Medications: Scheduled Meds:  . darbepoetin (ARANESP) injection - NON-DIALYSIS  100 mcg Subcutaneous Q Tue-1800  . digoxin  0.125 mg Oral Q M,W,F  . feeding supplement (ENSURE ENLIVE)  237 mL Oral BID BM  . ferrous sulfate  325 mg Oral BID WC  . lactulose  30 g Oral TID  . lactulose  300 mL Rectal Q6H  . pantoprazole  40 mg Oral Daily  . rifaximin  550 mg Oral BID  . sodium chloride flush  3 mL Intravenous Q12H  . spironolactone  50 mg Oral Daily   And  .  spironolactone  25 mg Oral QHS  . tamsulosin  0.4 mg Oral Daily  . torsemide  100 mg Oral Daily    Continuous Infusions: . sodium chloride 75 mL/hr at 06/27/16 1957    PRN Meds: acetaminophen **OR** acetaminophen, Gerhardt's butt cream, ondansetron **OR** ondansetron (ZOFRAN) IV, phenol  Physical Exam  Constitutional: He is oriented to person, place, and  time. He appears well-developed.  HENT:  Head: Normocephalic and atraumatic.  Cardiovascular: Normal rate.   Pulmonary/Chest: Effort normal. No accessory muscle usage. No tachypnea. No respiratory distress.  Abdominal: Soft. Normal appearance.  Neurological: He is alert and oriented to person, place, and time.            Vital Signs: BP 98/51 mmHg  Pulse 81  Temp(Src) 98 F (36.7 C) (Oral)  Resp 16  Ht 6' (1.829 m)  Wt 73.936 kg (163 lb)  BMI 22.10 kg/m2  SpO2 99% SpO2: SpO2: 99 % O2 Device: O2 Device: Not Delivered O2 Flow Rate:    Intake/output summary:  Intake/Output Summary (Last 24 hours) at 06/28/16 1222 Last data filed at 06/28/16 0851  Gross per 24 hour  Intake    900 ml  Output   2725 ml  Net  -1825 ml   LBM: Last BM Date: 06/27/16 Baseline Weight: Weight: 82.7 kg (182 lb 5.1 oz) Most recent weight: Weight: 73.936 kg (163 lb) (bed scale)       Palliative Assessment/Data:    Flowsheet Rows        Most Recent Value   Intake Tab    Referral Department  Hospitalist   Unit at Time of Referral  Cardiac/Telemetry Unit   Palliative Care Primary Diagnosis  Cardiac   Date Notified  06/26/16   Palliative Care Type  Return patient Palliative Care   Reason for referral  Clarify Goals of Care   Date of Admission  06/14/16   # of days IP prior to Palliative referral  12   Clinical Assessment    Psychosocial & Spiritual Assessment    Palliative Care Outcomes       Patient Active Problem List   Diagnosis Date Noted  . DNR (do not resuscitate)   . Renal mass, right 06/24/2016  . Acute on chronic  combined systolic and diastolic CHF (congestive heart failure) ([place]) 06/24/2016  . Anemia of chronic disease 06/24/2016  . Pressure ulcer 06/15/2016  . Acute on chronic renal failure ([place]) 06/14/2016  . Hyperkalemia 06/14/2016  . Diarrhea 05/09/2016  . Anemia due to other cause   . S/P placement of cardiac pacemaker 04/29/2016  . Melanoma ([place])   . Hepatic encephalopathy ([place]) 09/15/2015  . Chronic diastolic heart failure ([place])   . Palliative care encounter 08/11/2015  . Hyponatremia   . Liver failure ([place])   . Cirrhosis ([place])   . Hepatic AV fistula ([place]) 08/09/2015  . Mitral regurgitation 08/09/2015  . HHT (hereditary hemorrhagic telangiectasia) ([place]) 08/09/2015  . Anemia due to chronic blood loss 08/09/2015  . Right heart failure ([place]) 08/09/2015  . 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 Tricuspid regurgitation 08/09/2015    Palliative Care Assessment & Plan   Patient Profile: 81 y.o. male with past medical history of systolic and diastolic HF Echo 06/05/93 EF 45-50% NYHA class III, pulmonary hypertension, liver failure, CKD stage III, and hereditary hemorrhagic telangiectasia with hepatic AVMs and associated anemia requiring transfusion admitted on 06/14/2016 with worsening dyspnea and exertion at rest. Treated for acute on chronic diastolic and systolic heart failure. Seemed to be making some improvement and be moving towards discharge when he became severely encephalopathic with ammonia 216, unresponsive, hypotensive.   Assessment: Lying in bed. Very lethargic.   Recommendations/Plan:  Encephalopathy: Continue lactulose po vs enema for now. Although may be d/c this with decision for full comfort.   No signs of discomfort.  Goals of Care and Additional Recommendations:  Limitations  on Scope of Treatment: Avoid Hospitalization, No Artificial Feeding and No Surgical Procedures  Code Status:    Code Status Orders        Start     Ordered   06/14/16 2203  Do not attempt  resuscitation (DNR)   Continuous    Question Answer Comment  In the event of cardiac or respiratory ARREST Do not call a "code blue"   In the event of cardiac or respiratory ARREST Do not perform Intubation, CPR, defibrillation or ACLS   In the event of cardiac or respiratory ARREST Use medication by any route, position, wound care, and other measures to relive pain and suffering. May use oxygen, suction and manual treatment of airway obstruction as needed for comfort.      06/14/16 2203    Code Status History    Date Active Date Inactive Code Status Order ID Comments User Context   05/01/2016  3:13 PM 05/05/2016  6:19 PM DNR [id number]  Caren Griffins, MD Inpatient   04/29/2016 11:57 PM 05/01/2016  2:55 PM DNR [id number]  Toy Baker, MD Inpatient   04/21/2016  7:01 PM 04/23/2016  3:45 PM DNR [id number]  Tawni Millers, MD Inpatient   08/10/2015  6:09 PM 08/15/2015  3:25 PM DNR [id number]  Leone Brand, MD Inpatient       Prognosis:   < 2 weeks and likely days to 1 week likely with transition to full comfort care.   Discharge Planning:  Likely hospice facility.    Thank you for allowing the Palliative Medicine Team to assist in the care of this patient.   Time In: 1125 Time Out: 1200 Total Time 34mn Prolonged Time Billed  no       Greater than 50%  of this time was spent counseling and coordinating care related to the above assessment and plan.  PPershing Proud NP  Please contact Palliative Medicine Team phone at [phone] questions and concerns.

## 2016-06-28 NOTE — Progress Notes (Signed)
Pt. didn't not want to eat dinner nor did he want his evening medications arousable but would not speak just shook his head no. MD made aware.

## 2016-06-28 NOTE — Progress Notes (Signed)
Progress Note    Hunter [name]  [name] DOB: [birth year]  DOA: 06/14/2016 PCP: Penni Homans, MD    Brief Narrative:   81 y.o. male with known systolic and diastolic HF Echo [id number] EF 45-50% NYHA class III, pulmonary hypertension, liver failure, CKD stage III, and hereditary hemorrhagic telangiectasia with hepatic AVMs and associated anemia requiring transfusion, now presented to [facility] ED with main concern of several days duration of progressively worsening dyspnea with exertion and at rest. He was admitted for acute on chronic combined systolic and diastolic CHF. Cardiology and nephrology teams Nephrology team consulted and recommendations given. Although it was hoped that the patient could be discharged on 06/23/16, the patient was hypotensive, lethargic, and encephalopathic with ammonia levels in the 160s. Lactulose dosing was increased. Continues to be encephalopathic.  Assessment/Plan:   Principal problems:  Acute on chronic combined systolic and diastolic CHF, NYHA class III, improving The patient's last ECHO was done in 2016 with EF 45% and appears unchanged based on repeat ECHO on this admission. He previously was on lasix 80 mg IV BID, changed to Torsemide 100 mg PO QD 06/17/16. Evaluated by nephrology with recommendations to continue torsemide and fluid restriction. No further recommendations per cardiology. Monitor weight and volume status. Continue digoxin. Patient's condition has deteriorated and he is not taking in any by mouth's including medications due to his severe encephalopathy. More alert today, but still encephalopathic.  Hepatic encephalopathy in the setting of chronic cirrhosis of the liver, chronic - Ammonia level CXVIII on admission, peaked at 219 , lactulose dose was gradually increased, we'll increase total today as no bowel movement yet ,  - 20 with rifaximin  - Palliative medicine consulted to follow with the patient, , discussed with wife today about goals  of care, discussed with Dr. Carlean Purl  , [facility] GI physician, will discuss with what today about goals of care and overall prognosis , may consider abdominal ultrasound to evaluate for sinusitis to see if paracentesis is possible to rule out SBP .    Right renal mass, stable 0.9 cm mass discovered incidentally on renal ultrasound. Neoplasm could not be excluded. Given current comorbidities, would avoid inpatient workup. If patient stabilizes as an outpatient, consider renal mass protocol MRI or CT of the abdomen with/without IV contrast for further evaluation.   Acute on chronic kidney disease stage III - IV/hyperkalemia, improving The patient's baseline GFR in 30-40's and baseline Cr 2 - 2.2. Initially elevated over usual baseline values with a creatinine as high as 3.4, now back to baseline values. Will need outpatient nephrology follow-up. Hyperkalemia was secondary to acute renal failure which has now resolved.   Hyponatremia, chronic/stable Secondary to cirrhosis and CHF physiology.    Chronic thrombocytopenia  Secondary to cirrhosis of the liver.   UTI with hematuria, resolved Status post 5 days therapy with Rocephin.    HHT (hereditary hemorrhagic telangiectasia) ([place]), chronic   Anemia of chronic disease and chronic blood loss from hereditary hemorrhagic telangiectasia, chronic Monitor hemoglobin. Continue Aranesp and iron.   Buttock wound, right, ongoing  Pressure Ulcer stage 3 pressure injury present on admission. Continue present plan of care with Alginate to absorb moisture and promote healing, and foam dressing to attempt to protect from soiling. Pt is on a low-airloss bed to reduce pressure to the affected areas and increase airflow to bilat buttocks.  Family Communication/Anticipated D/C date and plan/Code Status   DVT prophylaxis: SCDs ordered. Code Status: DO NOT RESUSCITATE Family Communication: Wife  updated at the bedside. Disposition Plan: Unclear disposition  plan, patient remains encephalopathic, with no oral intake, followed by palliative medicine   Medical Consultants:    Cardiology  Nephrology  Palliative   Procedures:    Anti-Infectives:   Acyclovir 06/14/16---> 06/15/16 Rifaximin 06/14/16---> Rocephin 06/15/16---> 06/23/16  Subjective:   Hunter [name] continues to be lethargic, Denies any complaints  Objective:    Filed Vitals:   06/27/16 1127 06/27/16 2149 06/28/16 0524 06/28/16 1207  BP: 114/47 95/45 104/50 98/51  Pulse: 82 81 81 81  Temp:  97.5 F (36.4 C) 98 F (36.7 C) 98 F (36.7 C)  TempSrc:  Oral Oral Oral  Resp: 16 15 16    Height:      Weight:   73.936 kg (163 lb)   SpO2: 100% 100% 100% 99%    Intake/Output Summary (Last 24 hours) at 06/28/16 1530 Last data filed at 06/28/16 0851  Gross per 24 hour  Intake    900 ml  Output   1925 ml  Net  -1025 ml   Filed Weights   06/26/16 0414 06/27/16 0508 06/28/16 0524  Weight: 77.111 kg (170 lb) 75.841 kg (167 lb 3.2 oz) 73.936 kg (163 lb)    Exam: General exam: Appears calm and comfortable.  Respiratory system: Clear to auscultation. Respiratory effort normal. Cardiovascular system: S1 & S2 heard, RRR. No JVD,  rubs, gallops or clicks. No murmurs. Gastrointestinal system: Abdomen is nondistended, soft and nontender. No organomegaly or masses felt. Normal bowel sounds heard. Central nervous system: Awake but still slower to respond than normal. No focal neurological deficits.Oriented to self and place. Extremities: No clubbing, edema, or cyanosis. Skin: Telangiectasias. Psychiatry: Flat affect.   Data Reviewed:   I have personally reviewed following labs and imaging studies:  Labs: Basic Metabolic Panel:  Recent Labs Lab 06/22/16 0427 06/25/16 0223 06/26/16 0256 06/27/16 0213 06/28/16 0350  NA 125* 131* 134* 142 140  K 4.6 4.8 5.2* 4.5 3.8  CL 88* 98* 101 109 110  CO2 27 26 23 23 22   GLUCOSE 94 114* 92 88 99  BUN 86* 81* 87* 85* 76*    CREATININE 1.99* 2.15* 2.40* 2.40* 2.17*  CALCIUM 8.8* 9.2 9.1 9.2 8.7*  PHOS 2.7  --   --   --   --    GFR Estimated Creatinine Clearance: 29.3 mL/min (by C-G formula based on Cr of 2.17). Liver Function Tests:  Recent Labs Lab 06/22/16 0427 06/25/16 0223  AST  --  37  ALT  --  26  ALKPHOS  --  160*  BILITOT  --  2.0*  PROT  --  4.8*  ALBUMIN 2.5* 2.3*    Recent Labs Lab 06/23/16 1110 06/25/16 0223  AMMONIA 167* 219*    CBC:  Recent Labs Lab 06/22/16 0426 06/25/16 0223  WBC 5.9 6.0  HGB 7.7* 7.5*  HCT 23.8* 24.3*  MCV 92.2 94.6  PLT 69* 65*   BNP (last 3 results)  Recent Labs  08/09/15 1621  PROBNP 188.0*   Microbiology No results found for this or any previous visit (from the past 240 hour(s)).  Radiology:  Dg Chest 2 View  06/14/2016  CLINICAL DATA:  Increasing shortness of breath and swelling since this morning, CHF, cirrhosis, atrial fibrillation, pulmonary hypertension, history melanoma EXAM: CHEST  2 VIEW COMPARISON:  05/01/2016 FINDINGS: LEFT subclavian transvenous pacemaker lead projects over RIGHT ventricle. Enlargement of cardiac silhouette with pulmonary vascular congestion. Atherosclerotic calcification aorta. Perihilar infiltrates greater on LEFT  question asymmetric edema versus infection. Subsegmental atelectasis LEFT base. No pleural effusion or pneumothorax Bones demineralized. IMPRESSION: Enlargement of cardiac silhouette with pulmonary vascular congestion. Accentuated perihilar markings particularly on LEFT question asymmetric edema versus infection. Aortic atherosclerosis and pacemaker noted. Electronically Signed   By: Lavonia Dana M.D.   On: 06/14/2016 17:57   US Renal  06/15/2016  CLINICAL DATA:  Acute renal failure EXAM: RENAL / URINARY TRACT ULTRASOUND COMPLETE COMPARISON:  None. FINDINGS: Right Kidney: Length: 12.7 cm. Mildly echogenic right renal parenchyma. No right hydronephrosis. Questionable hyperechoic 0.8 x 0.6 x 0.9 cm renal  cortical lesion in the upper right kidney (possibly artifactual due to junctional defect). Several tiny simple appearing renal cortical cysts in the right kidney, largest 1.2 x 1.0 x 1.0 cm in the interpolar right kidney. Left Kidney: Length: 12.2 cm. Mildly echogenic left renal parenchyma. No left hydronephrosis. Simple appearing small renal cortical cysts in the left kidney, largest 1.6 x 1.3 x 0.7 cm in the lateral left kidney. Bladder: Urinary bladder is collapsed by indwelling Foley catheter and cannot be evaluated on this scan. IMPRESSION: 1. No hydronephrosis. 2. Mildly echogenic kidneys, indicating nonspecific renal parenchymal disease of uncertain chronicity. 3. Questionable hyperechoic 0.9 cm renal cortical lesion in the upper right kidney, neoplasm not excluded. Recommend further evaluation with renal mass protocol MRI or CT abdomen without and with IV contrast on a short term outpatient basis. 4. Urinary bladder collapsed by indwelling Foley catheter and not evaluated on this scan. Electronically Signed   By: Ilona Sorrel M.D.   On: 06/15/2016 12:00    Medications:   . darbepoetin (ARANESP) injection - NON-DIALYSIS  100 mcg Subcutaneous Q Tue-1800  . digoxin  0.125 mg Oral Q M,W,F  . feeding supplement (ENSURE ENLIVE)  237 mL Oral BID BM  . ferrous sulfate  325 mg Oral BID WC  . lactulose  45 g Oral QID  . lactulose  300 mL Rectal Q6H  . pantoprazole  40 mg Oral Daily  . rifaximin  550 mg Oral BID  . sodium chloride flush  3 mL Intravenous Q12H  . spironolactone  50 mg Oral Daily   And  . spironolactone  25 mg Oral QHS  . tamsulosin  0.4 mg Oral Daily  . torsemide  100 mg Oral Daily   Continuous Infusions: . sodium chloride 75 mL/hr at 06/27/16 1957    Time spent: 25 minutes.   LOS: 14 days   [facility], Arwa Yero  [facility] Pager [phone]  *Please refer to [email or web address], password [email or web address] to get updated schedule on who will round on this patient, as hospitalists switch teams  weekly. If 7PM-7AM, please contact night-coverage at [email or web address], password [email or web address] for any overnight needs.  06/28/2016, 3:30 PM

## 2016-06-28 NOTE — Progress Notes (Signed)
Wife informed and updated of husbands condition.wife stated will come and see pt.

## 2016-06-28 NOTE — Progress Notes (Signed)
Paged K Schorr. Re. pts low BP. Orders received and carried out

## 2016-06-28 NOTE — Care Management Important Message (Signed)
Important Message  Patient Details  Name: Hunter [name] MRN: [id number] Date of Birth: 1934/12/20   Medicare Important Message Given:  Yes    [name] 06/28/2016, 10:05 AM

## 2016-06-29 ENCOUNTER — Telehealth: Payer: Self-pay | Admitting: Family Medicine

## 2016-06-29 MED ORDER — GLYCOPYRROLATE 1 MG PO TABS
1.0000 mg | ORAL_TABLET | ORAL | Status: DC | PRN
  Filled 2016-06-29: qty 1

## 2016-06-29 MED ORDER — POLYVINYL ALCOHOL 1.4 % OP SOLN
1.0000 [drp] | Freq: Four times a day (QID) | OPHTHALMIC | Status: DC | PRN
  Filled 2016-06-29: qty 15

## 2016-06-29 MED ORDER — GLYCOPYRROLATE 0.2 MG/ML IJ SOLN
0.2000 mg | INTRAMUSCULAR | Status: DC | PRN
  Filled 2016-06-29: qty 1

## 2016-06-29 MED ORDER — GLYCOPYRROLATE 0.2 MG/ML IJ SOLN: 0.2000 mg | INTRAMUSCULAR | Status: AC | PRN

## 2016-06-29 MED ORDER — ONDANSETRON HCL 4 MG/2ML IJ SOLN: 4.0000 mg | Freq: Four times a day (QID) | INTRAMUSCULAR | Status: AC | PRN

## 2016-06-29 MED ORDER — BIOTENE DRY MOUTH MT LIQD: 15.0000 mL | OROMUCOSAL | Status: DC | PRN

## 2016-06-29 MED ORDER — OXYCODONE HCL 20 MG/ML PO CONC: 5.0000 mg | ORAL | Status: AC | PRN

## 2016-06-29 MED ORDER — ONDANSETRON 4 MG PO TBDP: 4.0000 mg | ORAL_TABLET | Freq: Four times a day (QID) | ORAL | Status: AC | PRN

## 2016-06-29 MED ORDER — HALOPERIDOL 0.5 MG PO TABS: 0.5000 mg | ORAL_TABLET | ORAL | Status: AC | PRN

## 2016-06-29 MED ORDER — HALOPERIDOL LACTATE 5 MG/ML IJ SOLN: 0.5000 mg | INTRAMUSCULAR | Status: DC | PRN

## 2016-06-29 MED ORDER — OXYCODONE HCL 20 MG/ML PO CONC: 5.0000 mg | ORAL | Status: DC | PRN

## 2016-06-29 MED ORDER — ONDANSETRON 4 MG PO TBDP: 4.0000 mg | ORAL_TABLET | Freq: Four times a day (QID) | ORAL | Status: DC | PRN

## 2016-06-29 MED ORDER — HALOPERIDOL LACTATE 2 MG/ML PO CONC
0.5000 mg | ORAL | Status: DC | PRN
  Filled 2016-06-29: qty 0.3

## 2016-06-29 MED ORDER — HALOPERIDOL 0.5 MG PO TABS
0.5000 mg | ORAL_TABLET | ORAL | Status: DC | PRN
  Filled 2016-06-29: qty 1

## 2016-06-29 MED ORDER — ONDANSETRON HCL 4 MG/2ML IJ SOLN: 4.0000 mg | Freq: Four times a day (QID) | INTRAMUSCULAR | Status: DC | PRN

## 2016-06-29 MED ORDER — CETYLPYRIDINIUM CHLORIDE 0.05 % MT LIQD: 7.0000 mL | Freq: Two times a day (BID) | OROMUCOSAL | Status: DC

## 2016-06-29 NOTE — Telephone Encounter (Signed)
Ok to find him a sooner appt does not have to be a hospital follow up. Can be a 15 minutes if necessary but 30 minutes

## 2016-06-29 NOTE — Telephone Encounter (Signed)
Called to schedule a sooner appointment for patient and was informed that patient has been transferred to Hospice today

## 2016-06-29 NOTE — Progress Notes (Signed)
Full note to follow.   I have met with Dr. Waldron Labs along with family at bedside. They have opted for full comfort care with end stage cirrhosis complicated by heart failure and CKD. Prognosis poor (especially without lactulose) and estimated to be days to likely ~1 week. After discussion they would prefer for his end of life care to be provided in a hospice facility. CSW consulted. Medications added and adjusted for comfort.   Hunter Sill, NP Palliative Medicine Team Pager # [phone] (M-F 8a-5p) Team Phone # [phone] (Nights/Weekends)

## 2016-06-29 NOTE — Discharge Summary (Addendum)
Hunter [name], is a 81 y.o. male  DOB [birth year]  MRN [id number].  Admission date:  06/14/2016  Admitting Physician  Toy Baker, MD  Discharge Date:  06/29/2016   Primary MD  Penni Homans, MD  Recommendations for primary care physician for things to follow:  - Management per [facility].   Admission Diagnosis  Hyperkalemia [E87.5] Acute on chronic renal failure (HCC) [N17.9, N18.9] Acute on chronic diastolic (congestive) heart failure (HCC) [I50.33]   Discharge Diagnosis  Hyperkalemia [E87.5] Acute on chronic renal failure (HCC) [N17.9, N18.9] Acute on chronic diastolic (congestive) heart failure (HCC) [I50.33]    Active Problems:   HHT (hereditary hemorrhagic telangiectasia) (HCC)   Anemia due to chronic blood loss   Cardiac cirrhosis   Hyponatremia   Cirrhosis (HCC)   Acute on chronic renal failure (HCC)   Hyperkalemia   Pressure ulcer   Renal mass, right   Acute on chronic combined systolic and diastolic CHF (congestive heart failure) (HCC)   Anemia of chronic disease   DNR (do not resuscitate)      Past Medical History  Diagnosis Date  . CHF (congestive heart failure) ([place])   . Pacemaker     single lead Medtronic pacemaker  . HHT (hereditary hemorrhagic telangiectasia) ([place])   . Anemia   . Pulmonary hypertension ([place])   . 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 Renal insufficiency   . Cirrhosis of liver ([place])     felt to have cardiac cirrhosis.   . Atrial fibrillation ([place])   . Hepatic AV fistula ([place]) 08/09/2015  . 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 Anemia due to chronic blood loss 08/09/2015  . Right heart failure ([place]) 08/09/2015  . Tricuspid regurgitation 08/09/2015  . Melanoma ([place])     2 on nose, 1 on back, 1 on leg  . Diarrhea 05/09/2016  . Nausea without vomiting 06/11/2016    Past Surgical History  Procedure Laterality Date  . Pacemaker insertion    .  Colon surgery  1196  . Esophagogastroduodenoscopy N/A 08/13/2015    Procedure: ESOPHAGOGASTRODUODENOSCOPY (EGD);  Surgeon: Ladene Artist, MD;  [facility] ENDOSCOPY;  Service: Endoscopy;  Laterality: N/A;  . Tonsillectomy Bilateral   . Esophagogastroduodenoscopy N/A 05/01/2016    Procedure: ESOPHAGOGASTRODUODENOSCOPY (EGD);  Surgeon: Milus Banister, MD;  Location: [place];  Service: Endoscopy;  Laterality: N/A;  . Nasal endoscopy with epistaxis control N/A 05/03/2016    Procedure: NASAL ENDOSCOPY WITH EPISTAXIS CONTROL;  Surgeon: Melida Quitter, MD;  Location: [place];  Service: ENT;  Laterality: N/A;  Dr Oretha Caprice to follow with upper endoscopy  . Esophagogastroduodenoscopy N/A 05/03/2016    Procedure: ESOPHAGOGASTRODUODENOSCOPY (EGD);  Surgeon: Milus Banister, MD;  Location: [place];  Service: Gastroenterology;  Laterality: N/A;       History of present illness and  Hospital Course:     Kindly see H&P for history of present illness and admission details, please review complete Labs, Consult reports and Test reports for all details in brief  HPI  from the history and physical done on the day of admission 06/14/2016  Hunter [name] is a 81 y.o. 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 Per CHF nurse. She had some worsening shortness of breath last week called his cardiologist Dr. Jeffie Pollock Patient had recently his torsemide increased to 80 mg in the morning 40 in the evening as well as increase his metolazone to 2.5 for 2 days. He has lost 2 pounds and his swelling has improved. He was found to have hemoglobin of 6.9 by  Dr. Randel Pigg patient was transfused on June 30 2 units of PRBC.  Was doing at first better but then started developing again progressive shortness of breath. When he was seen today by home health nurse he was noted to have respirations and 30 Rales and decreased breath sounds nurse also thought that he may have irregular heartbeat at that time. They called 911 and patient was transferred to [facility] ER. He does endorse some worsening shortness of breath and lower extremity swelling since this morning apparently his family attempted to give extra dose of metolazone with no improvement. Denies any chest pain no fevers or chills no dizziness no lightheadedness no cough he has been complaining his medications. No confusion no fever.    Regarding pertinent Chronic problems: He has recurrent epistaxis has been going on for years in the past and been seen by ENT. Recently was admitted secondary to vomiting bright red blood GI was consulted and was treated with Protonix and octreotide and empiric Rocephin EGD was done on 22nd which could not be done secondary to severe epistaxis was worrisome for aspiration. He required EGD and the general anesthesia and 24th of May status post ablation of multiple bleeding gastric AVMs undergone nasal endoscopy and cauterization multiple bleeding sites last echogram was in August 2016 with EF of 45-50 percent   IN ER: Afebrile pulse 80 respirations 22 blood pressure 103/ 53 WBC 5.7 hemoglobin 8.0 platelets 72 sodium 124 potassium 5.9 creatinine 3.34 up from baseline of 1.9 Chest x-ray showed enlargement of cardiac silhouette and pulmonary vascular congestion ER provider spoke to Dr. Radford Pax with Cardiology who rec Nephrology consult and Dr. Jeffie Pollock to see in [facility] Course 81 y.o. male with known systolic and diastolic HF Echo [id number] EF 45-50% NYHA class III, pulmonary hypertension, liver failure, CKD stage III, and hereditary hemorrhagic telangiectasia with  hepatic AVMs and associated anemia requiring transfusion, now presented to [facility] ED with main concern of several days duration of progressively worsening dyspnea with exertion and at rest. He was admitted for acute on chronic combined systolic and diastolic CHF. Cardiology and nephrology teams Nephrology team consulted and recommendations given. Although it was hoped that the patient could be discharged on 06/23/16, the patient was hypotensive, lethargic, and encephalopathic with ammonia levels in the 160s. Lactulose dosing was increased. Continues to be encephalopathic, patient clinical condition and mental status continues to deteriorate, relative had family meeting, we discussed with patient, his wife, and his son, and decision was made for hospice/comfort measures.   Acute on chronic combined systolic and diastolic CHF, NYHA class III,  - Tenuous. Hunter [name], for Hepatic encephalopathy in the setting of chronic cirrhosis of the liver, chronic Right renal  mass, stable Acute on chronic kidney disease stage III - IV/hyperkalemia Hyponatremia, chronic/stable Chronic thrombocytopenia  UTI with hematuria,  HHT (hereditary hemorrhagic telangiectasia) ([place]), chronic Anemia of chronic disease and chronic blood loss from hereditary hemorrhagic telangiectasia, chronic Buttock wound, right   Discharge Condition - Discharge to hospice      Discharge Instructions  and  Discharge Medications    Discharge Instructions    Diet - low sodium heart healthy    Complete by:  As directed      Discharge instructions    Complete by:  As directed   Management per [facility] hospice     Increase activity slowly    Complete by:  As directed             Medication List    STOP taking these medications        acyclovir 400 MG tablet  Commonly known as:  ZOVIRAX      TAKE these medications        b complex-vitamin c-folic acid 0.8 MG Tabs tablet  Take 1 tablet by mouth daily.      cyanocobalamin 1000 MCG/ML injection  Commonly known as:  (VITAMIN B-12)  Inject 1,000 mcg into the muscle every 30 (thirty) days. 1st of every month     digoxin 0.125 MG tablet  Commonly known as:  LANOXIN  Take 1 tablet (0.125 mg total) by mouth 3 (three) times a week. Mon, Wed, Fri     feeding supplement (ENSURE ENLIVE) Liqd  Take 237 mLs by mouth 2 (two) times daily between meals.     ferrous sulfate 325 (65 FE) MG EC tablet  Take 325 mg by mouth 2 (two) times daily with a meal.     Gerhardt's butt cream Crea  Apply 1 application topically 3 (three) times daily.     glycopyrrolate 0.2 MG/ML injection  Commonly known as:  ROBINUL  Inject 1 mL (0.2 mg total) into the skin every 4 (four) hours as needed (excessive secretions).     haloperidol 0.5 MG tablet  Commonly known as:  HALDOL  Take 1 tablet (0.5 mg total) by mouth every 4 (four) hours as needed for agitation (or delirium).     KLOR-CON M20 20 MEQ tablet  Generic drug:  potassium chloride SA  TAKE TWO TABLETS BY MOUTH IN THE MORNING, THEN TAKE ONE IN THE EVENING     lactulose 10 GM/15ML solution  Commonly known as:  CHRONULAC  Take 45 mLs (30 g total) by mouth 3 (three) times daily. Titrate to 3-4 BM's day     lactulose 10 GM/15ML solution  Commonly known as:  CHRONULAC  TAKE 45MLS BY MOUTH TWICE DAILY     Magnesium 250 MG Tabs  Take 250 mg by mouth daily.     metolazone 2.5 MG tablet  Commonly known as:  ZAROXOLYN  Take 1 tablet (2.5 mg total) by mouth as needed (for weight of 171 lb or greater).     ondansetron 4 MG disintegrating tablet  Commonly known as:  ZOFRAN-ODT  Take 1 tablet (4 mg total) by mouth every 6 (six) hours as needed for nausea.     ondansetron 4 MG tablet  Commonly known as:  ZOFRAN  Take 1 tablet (4 mg total) by mouth every 8 (eight) hours as needed for nausea or vomiting.     ondansetron 4 MG/2ML Soln injection  Commonly known as:  ZOFRAN  Inject 2 mLs (4 mg total) into the vein  every 6 (six) hours as needed for nausea.     oxyCODONE 20 MG/ML concentrated solution  Commonly known as:  ROXICODONE INTENSOL  Place 0.3 mLs (6 mg total) under the tongue every 2 (two) hours as needed for moderate pain (or dyspnea).     oxyCODONE 20 MG/ML concentrated solution  Commonly known as:  ROXICODONE INTENSOL  Take 0.3 mLs (6 mg total) by mouth every 2 (two) hours as needed for moderate pain (or dyspnea).     oxymetazoline 0.05 % nasal spray  Commonly known as:  AFRIN  Place 1 spray into both nostrils 2 (two) times daily.     pantoprazole 40 MG tablet  Commonly known as:  PROTONIX  TAKE ONE TABLET BY MOUTH ONCE DAILY     promethazine 12.5 MG tablet  Commonly known as:  PHENERGAN  Take 0.5 tablets (6.25 mg total) by mouth every 8 (eight) hours as needed for nausea or vomiting.     rifaximin 550 MG Tabs tablet  Commonly known as:  XIFAXAN  Take 1 tablet (550 mg total) by mouth 2 (two) times daily.     saline Gel  Place 1 application into the nose 2 (two) times daily as needed (for nose bleeds).     spironolactone 50 MG tablet  Commonly known as:  ALDACTONE  Take 50 mg (1 tab) in am and 25 mg (1/2 tab) in pm.     SYRINGE-NEEDLE (DISP) 3 ML 25G X 5/8" 3 ML Misc  To use with B12 injections     torsemide 100 MG tablet  Commonly known as:  DEMADEX  Take 1 tablet (100 mg total) by mouth daily.          Diet and Activity recommendation: See Discharge Instructions above   Consults obtained -   Cardiology  Nephrology  Palliative   Major procedures and Radiology Reports - PLEASE review detailed and final reports for all details, in brief -     Dg Chest 2 View  06/14/2016  CLINICAL DATA:  Increasing shortness of breath and swelling since this morning, CHF, cirrhosis, atrial fibrillation, pulmonary hypertension, history melanoma EXAM: CHEST  2 VIEW COMPARISON:  05/01/2016 FINDINGS: LEFT subclavian transvenous pacemaker lead projects over RIGHT ventricle.  Enlargement of cardiac silhouette with pulmonary vascular congestion. Atherosclerotic calcification aorta. Perihilar infiltrates greater on LEFT question asymmetric edema versus infection. Subsegmental atelectasis LEFT base. No pleural effusion or pneumothorax Bones demineralized. IMPRESSION: Enlargement of cardiac silhouette with pulmonary vascular congestion. Accentuated perihilar markings particularly on LEFT question asymmetric edema versus infection. Aortic atherosclerosis and pacemaker noted. Electronically Signed   By: Lavonia Dana M.D.   On: 06/14/2016 17:57   US Renal  06/15/2016  CLINICAL DATA:  Acute renal failure EXAM: RENAL / URINARY TRACT ULTRASOUND COMPLETE COMPARISON:  None. FINDINGS: Right Kidney: Length: 12.7 cm. Mildly echogenic right renal parenchyma. No right hydronephrosis. Questionable hyperechoic 0.8 x 0.6 x 0.9 cm renal cortical lesion in the upper right kidney (possibly artifactual due to junctional defect). Several tiny simple appearing renal cortical cysts in the right kidney, largest 1.2 x 1.0 x 1.0 cm in the interpolar right kidney. Left Kidney: Length: 12.2 cm. Mildly echogenic left renal parenchyma. No left hydronephrosis. Simple appearing small renal cortical cysts in the left kidney, largest 1.6 x 1.3 x 0.7 cm in the lateral left kidney. Bladder: Urinary bladder is collapsed by indwelling Foley catheter and cannot be evaluated on this scan. IMPRESSION: 1. No hydronephrosis. 2. Mildly echogenic kidneys, indicating nonspecific renal parenchymal  disease of uncertain chronicity. 3. Questionable hyperechoic 0.9 cm renal cortical lesion in the upper right kidney, neoplasm not excluded. Recommend further evaluation with renal mass protocol MRI or CT abdomen without and with IV contrast on a short term outpatient basis. 4. Urinary bladder collapsed by indwelling Foley catheter and not evaluated on this scan. Electronically Signed   By: Ilona Sorrel M.D.   On: 06/15/2016 12:00    Micro  Results     No results found for this or any previous visit (from the past 240 hour(s)).     Today   Subjective:   Hunter [name] today Denies any complaints, confused  Objective:   Blood pressure 90/52, pulse 80, temperature 97.4 F (36.3 C), temperature source Oral, resp. rate 18, height 6' (1.829 m), weight 73.936 kg (163 lb), SpO2 100 %.   Intake/Output Summary (Last 24 hours) at 06/29/16 1506 Last data filed at 06/29/16 1410  Gross per 24 hour  Intake    720 ml  Output   1351 ml  Net   -631 ml    Exam Awake Alert,Confused  Supple Neck,No JVD, Symmetrical Chest wall movement, Good air movement bilaterally,  RRR,No Gallops,Rubs or new Murmurs, No Parasternal Heave +ve B.Sounds, Abd Soft, Non tender,  No rebound -guarding or rigidity. No Cyanosis, Clubbing or edema, No new Rash or bruise  Data Review   CBC w Diff: Lab Results  Component Value Date   WBC 6.0 06/25/2016   HGB 7.5* 06/25/2016   HCT 24.3* 06/25/2016   PLT 65* 06/25/2016   LYMPHOPCT 10 06/14/2016   MONOPCT 9 06/14/2016   EOSPCT 1 06/14/2016   BASOPCT 0 06/14/2016    CMP: Lab Results  Component Value Date   NA 140 06/28/2016   K 3.8 06/28/2016   CL 110 06/28/2016   CO2 22 06/28/2016   BUN 76* 06/28/2016   CREATININE 2.17* 06/28/2016   CREATININE 1.65* 01/21/2016   PROT 4.8* 06/25/2016   ALBUMIN 2.3* 06/25/2016   BILITOT 2.0* 06/25/2016   ALKPHOS 160* 06/25/2016   AST 37 06/25/2016   ALT 26 06/25/2016  .   Total Time in preparing paper work, data evaluation and todays exam - 35 minutes  ELGERGAWY, DAWOOD M.D on 06/29/2016 at 3:06 PM  [facility]   Office  [phone]

## 2016-06-29 NOTE — Clinical Social Work Note (Addendum)
CSW received call from [place] with Palliative Care regarding discharge plan for patient. Family has decided on residential hospice and wife requesting Hospice and [place]. Call made to Beverlee Nims, nurse liaison with [facility] and referral made. [facility] has beds and Beverlee Nims will make contact with wife.  Admissions paperwork completed and patient will discharge to [facility] today, transported by ambulance. Transport called at 3:19 pm.   Kron Everton Givens, MSW, [place] Licensed Clinical Social Worker [place] [phone]

## 2016-06-29 NOTE — Progress Notes (Signed)
1628 Report given to [facility] facility

## 2016-06-29 NOTE — Telephone Encounter (Signed)
Patient being discharged from hospital today. Request follow up within 7-10 days. Patient declined to be seen by anyone except Provider. Scheduled for 8/11 @ 11:15 (Next available Hospital Follow up). Advised patient that office will call him if provider wants to work him in prior to that appointment date.

## 2016-06-29 NOTE — Discharge Instructions (Signed)
Management [facility]

## 2016-06-29 NOTE — Consult Note (Signed)
[facility] [place] Met with spouse, [name], and son, Ronalee Belts, to discuss referral. Reviewed hospice philosophy and scope of services that are consistent with Goals of Care. Medical Records reviewed with hospice provider and the patient was approved for Hospice Home at [facility]. A bed is available today. Awaiting Discharge order and Summary. Shelle Iron, SW notified. Thank you for this referral.  Yetta Glassman RN [facility] [place] [phone]

## 2016-06-30 ENCOUNTER — Other Ambulatory Visit: Payer: Medicare Other

## 2016-06-30 ENCOUNTER — Ambulatory Visit: Payer: Medicare Other

## 2016-06-30 ENCOUNTER — Ambulatory Visit: Payer: Medicare Other | Admitting: Hematology & Oncology

## 2016-07-04 ENCOUNTER — Telehealth: Payer: Self-pay | Admitting: Family Medicine

## 2016-07-04 NOTE — Telephone Encounter (Signed)
Pt's spouse called in on 06/2516 at 9:48 to make PCP aware that pt is in hospice and will not be coming to any more appt's.   She says that pt isn't doing very well.

## 2016-07-06 ENCOUNTER — Encounter (HOSPITAL_COMMUNITY): Payer: Medicare Other

## 2016-07-09 NOTE — Telephone Encounter (Signed)
Please let her know we are here if she needs anything.

## 2016-07-10 NOTE — Telephone Encounter (Signed)
Did call the patients wife.  Unfortunately he passed away on Wednesday July 26/2017.  I placed a sympathy card on your desk.

## 2016-07-11 DEATH — deceased

## 2016-07-21 ENCOUNTER — Inpatient Hospital Stay: Payer: Medicare Other | Admitting: Family Medicine

## 2016-08-10 DIAGNOSIS — Z515 Encounter for palliative care: Secondary | ICD-10-CM

## 2016-08-10 NOTE — Progress Notes (Addendum)
Daily Progress Note   Patient Name: Hunter [name]       Date: 08/10/2016 DOB: 1934-12-24  Age: 81 y.o. MRN#: [id number] Attending Physician: No att. providers found Primary Care Physician: Penni Homans, MD Admit Date: 06/14/2016  Reason for Consultation/Follow-up: Establishing goals of care  Subjective: I have met with Dr. Waldron Labs along with family at bedside. They have opted for full comfort care with end stage cirrhosis complicated by heart failure and CKD. Prognosis poor (especially without lactulose) and estimated to be days to likely ~1 week or less. After discussion they would prefer for his end of life care to be provided in a hospice facility. CSW consulted. Medications added and adjusted for comfort.    Length of Stay: 15  Current Medications: Scheduled Meds:    Continuous Infusions:   PRN Meds:   Physical Exam  Constitutional: He appears well-developed. He appears lethargic.  HENT:  Head: Normocephalic and atraumatic.  Cardiovascular: Normal rate.   Pulmonary/Chest: Effort normal. No accessory muscle usage. No tachypnea. No respiratory distress.  Abdominal: Soft. Normal appearance.  Neurological: He appears lethargic.            Vital Signs: BP (!) 90/52 (BP Location: Right Arm)   Pulse 80   Temp 97.4 F (36.3 C) (Oral)   Resp 18   Ht 6' (1.829 m)   Wt 73.9 kg (163 lb) Comment: bedscale  SpO2 100%   BMI 22.11 kg/m   SpO2: SpO2: 100 % O2 Device: O2 Device: Not Delivered O2 Flow Rate:    Intake/output summary: No intake or output data in the 24 hours ending 08/10/16 1600 LBM: Last BM Date: 06/28/16 Baseline Weight: Weight: 82.7 kg (182 lb 5.1 oz) Most recent weight: Weight: 73.9 kg (163 lb) (bedscale)       Palliative Assessment/Data:    Flowsheet  Rows   Flowsheet Row Most Recent Value  Intake Tab  Referral Department  Hospitalist  Unit at Time of Referral  Cardiac/Telemetry Unit  Palliative Care Primary Diagnosis  Cardiac  Date Notified  06/26/16  Palliative Care Type  Return patient Palliative Care  Reason for referral  Clarify Goals of Care  Date of Admission  06/14/16  Date first seen by Palliative Care  06/27/16  # of days Palliative referral response time  1 Day(s)  # of  days IP prior to Palliative referral  12  Clinical Assessment  Psychosocial & Spiritual Assessment  Palliative Care Outcomes  Actual Discharge Date  06/29/16 [hospice facility - [place]]      Patient Active Problem List   Diagnosis Date Noted  . DNR (do not resuscitate)   . Renal mass, right 06/24/2016  . Acute on chronic combined systolic and diastolic CHF (congestive heart failure) ([place]) 06/24/2016  . Anemia of chronic disease 06/24/2016  . Pressure ulcer 06/15/2016  . Acute on chronic renal failure ([place]) 06/14/2016  . Hyperkalemia 06/14/2016  . Diarrhea 05/09/2016  . Anemia due to other cause   . S/P placement of cardiac pacemaker 04/29/2016  . Melanoma ([place])   . Hepatic encephalopathy ([place]) 09/15/2015  . Chronic diastolic heart failure ([place])   . Palliative care encounter 08/11/2015  . Hyponatremia   . Liver failure ([place])   . Cirrhosis ([place])   . Hepatic AV fistula ([place]) 08/09/2015  . Mitral regurgitation 08/09/2015  . HHT (hereditary hemorrhagic telangiectasia) ([place]) 08/09/2015  . Anemia due to chronic blood loss 08/09/2015  . Right heart failure ([place]) 08/09/2015  . 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 Tricuspid regurgitation 08/09/2015    Palliative Care Assessment & Plan   Patient Profile: 81 y.o. male with past medical history of systolic and diastolic HF Echo 0/38/88 EF 45-50% NYHA class III, pulmonary hypertension, liver failure, CKD stage III, and hereditary hemorrhagic telangiectasia with hepatic AVMs and associated anemia  requiring transfusion admitted on 06/14/2016 with worsening dyspnea and exertion at rest. Treated for acute on chronic diastolic and systolic heart failure. Seemed to be making some improvement and be moving towards discharge when he became severely encephalopathic with ammonia 216, unresponsive, hypotensive.   Assessment: Lying in bed. Very lethargic.   Recommendations/Plan:  Pain/dyspnea: Oxyfast 5 mg every 2 hours prn.   Secretions: Robinul prn.   Agitation: Haldol prn.   Goals of Care and Additional Recommendations:  Limitations on Scope of Treatment: Avoid Hospitalization, No Artificial Feeding and No Surgical Procedures  Code Status:    Code Status Orders        Start     Ordered   06/14/16 2203  Do not attempt resuscitation (DNR)   Continuous    Question Answer Comment  In the event of cardiac or respiratory ARREST Do not call a "code blue"   In the event of cardiac or respiratory ARREST Do not perform Intubation, CPR, defibrillation or ACLS   In the event of cardiac or respiratory ARREST Use medication by any route, position, wound care, and other measures to relive pain and suffering. May use oxygen, suction and manual treatment of airway obstruction as needed for comfort.      06/14/16 2203    Code Status History    Date Active Date Inactive Code Status Order ID Comments User Context   05/01/2016  3:13 PM 05/05/2016  6:19 PM DNR [id number]  Caren Griffins, MD Inpatient   04/29/2016 11:57 PM 05/01/2016  2:55 PM DNR [id number]  Toy Baker, MD Inpatient   04/21/2016  7:01 PM 04/23/2016  3:45 PM DNR [id number]  Tawni Millers, MD Inpatient   08/10/2015  6:09 PM 08/15/2015  3:25 PM DNR [id number]  Leone Brand, MD Inpatient       Prognosis:   < 2 weeks and likely days to 1 week likely with transition to full comfort care.   Discharge Planning:  Likely hospice facility.    Thank you for allowing  the Palliative Medicine Team to assist in the care of this  patient.   Time In: 0955 Time Out: 1010 Total Time 74mn Prolonged Time Billed  no       Greater than 50%  of this time was spent counseling and coordinating care related to the above assessment and plan.  PPershing Proud NP  Please contact Palliative Medicine Team phone at [phone] questions and concerns.

## 2017-06-21 IMAGING — CR DG CHEST 1V PORT
1 series · 1 of 1 positions shown · non-contrast
Comparison: None.

CLINICAL DATA: Shortness of Breath

EXAM:
PORTABLE CHEST 1 VIEW

[AP]
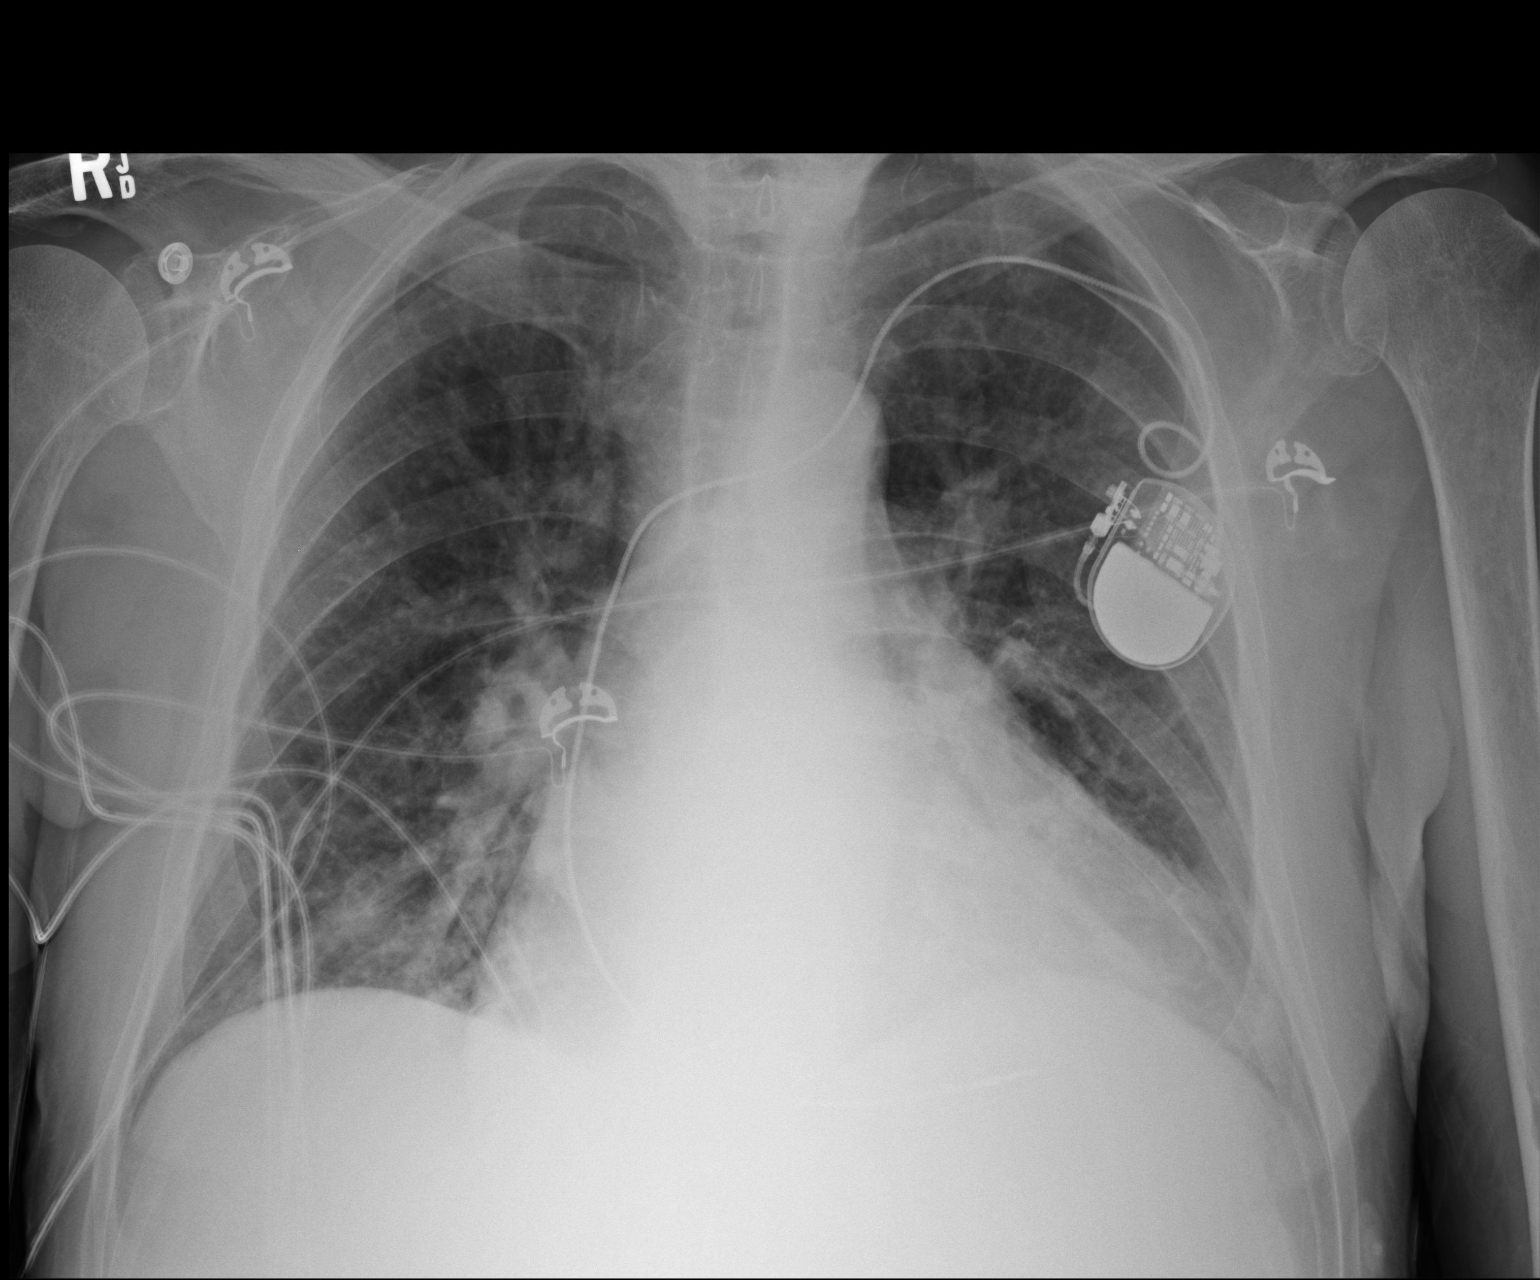

[1 of 1 positions shown; findings below may reference images not displayed]

FINDINGS: There is no edema or consolidation. Heart is enlarged with pulmonary
vascularity within normal limits. Pacemaker lead is attached to the
right ventricle. No adenopathy. There are surgical clips in the
lower right neck region.
IMPRESSION: Cardiomegaly.  No edema or consolidation.

## 2017-06-29 IMAGING — CT CT HEAD W/O CM
3 of 4 series · 17 of 47 positions shown, 20 images · non-contrast
Comparison: None.

CLINICAL DATA: Acute onset of altered mental status. Generalized
weakness, unsteady gait and aphasia. Initial encounter.

EXAM:
CT HEAD WITHOUT CONTRAST
TECHNIQUE: Contiguous axial images were obtained from the base of the skull
through the vertex without intravenous contrast.

[Series 201: head w/o, idose (1) · axial · non-contrast · 0.40mm/px · z∈[+36,+166]mm · 11 of 32 slices shown, 14 images]
[im 3/32  brain]
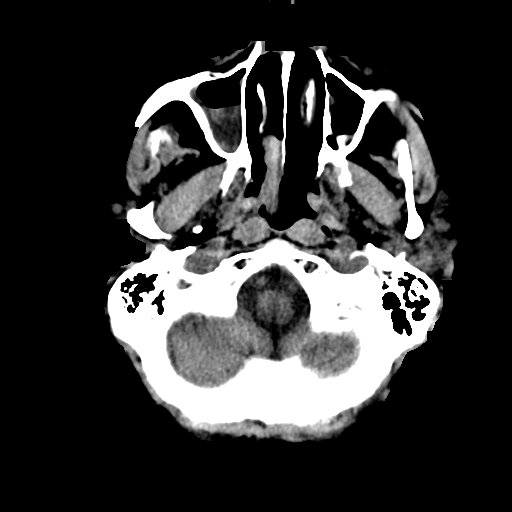
[im 3/32  bone]
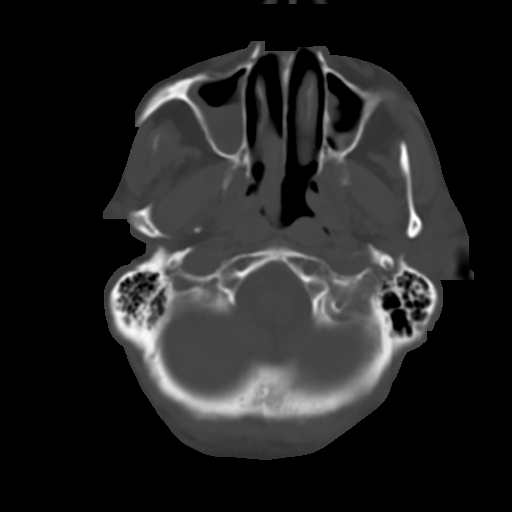
[im 5/32  brain]
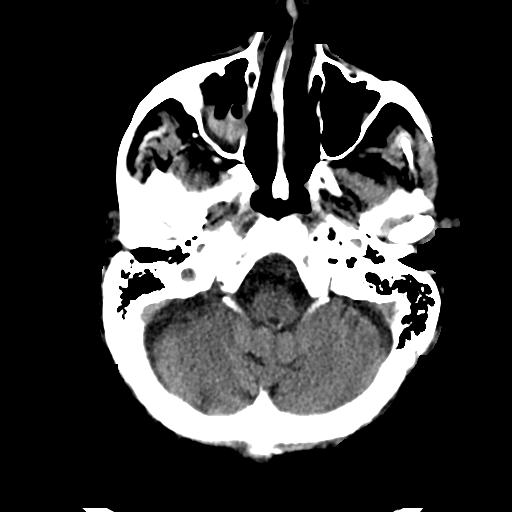
[im 7/32  brain]
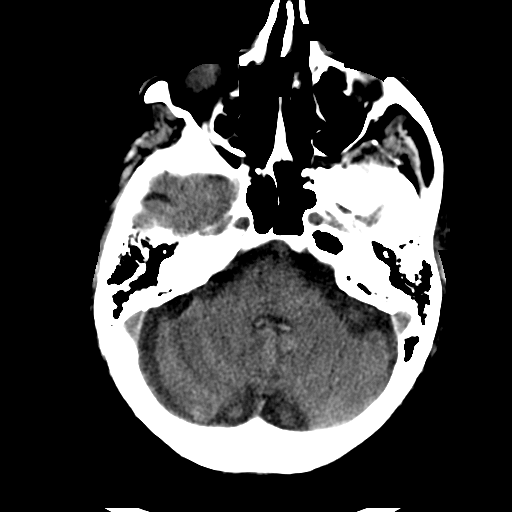
[im 12/32  brain]
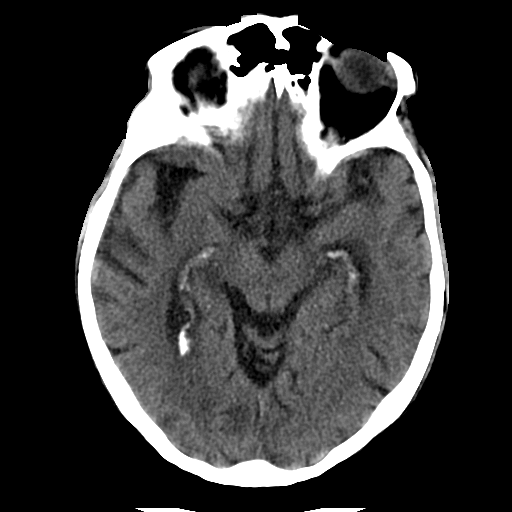
[im 14/32  brain]
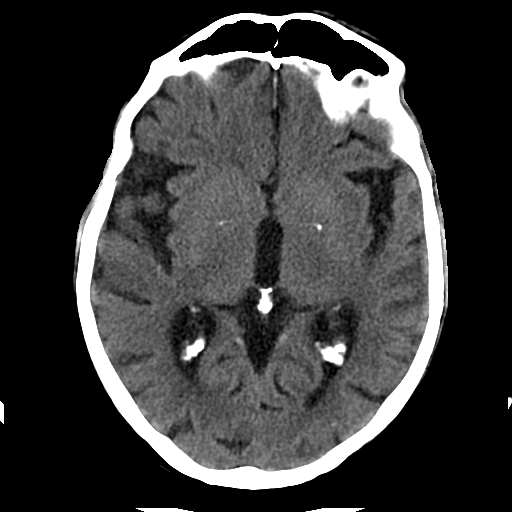
[im 14/32  bone]
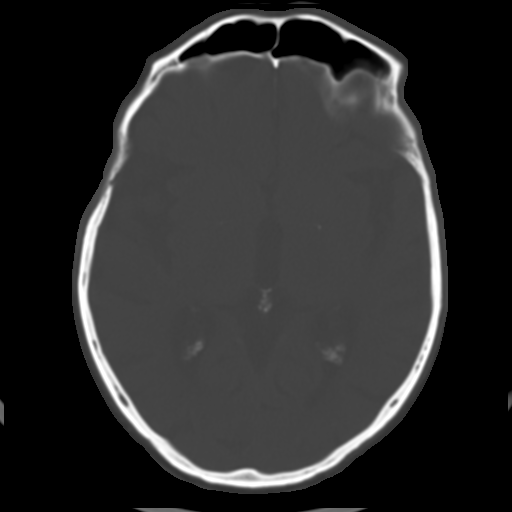
[im 16/32  brain]
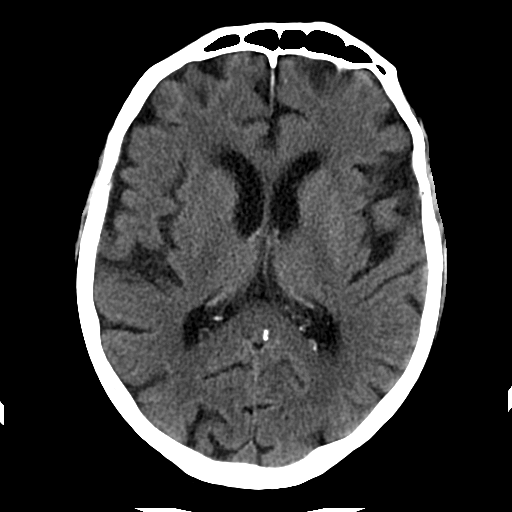
[im 18/32  brain]
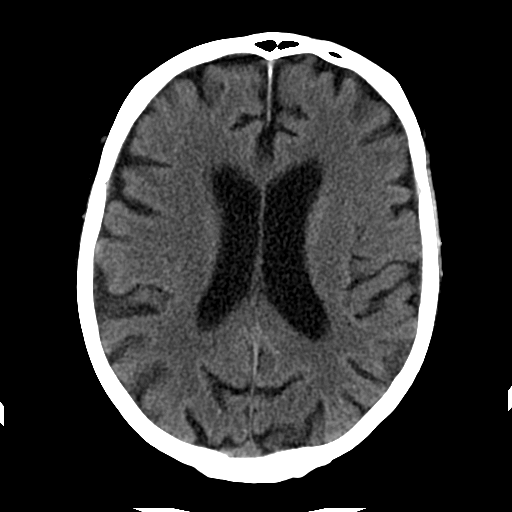
[im 20/32  brain]
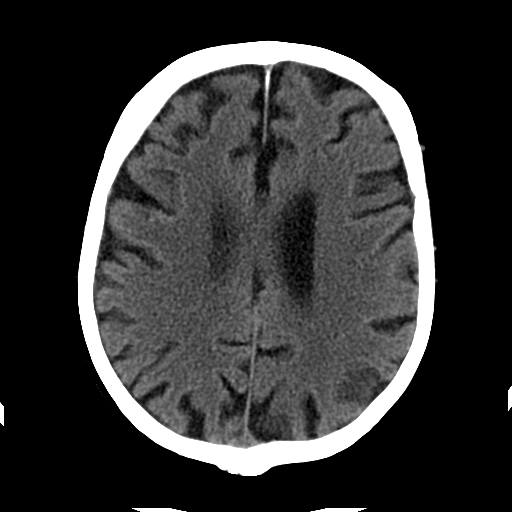
[im 25/32  brain]
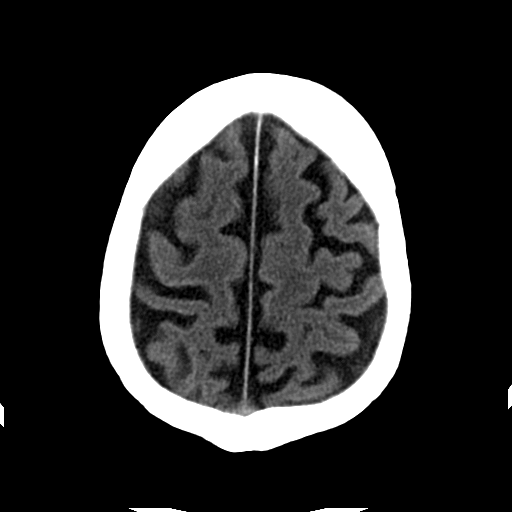
[im 25/32  bone]
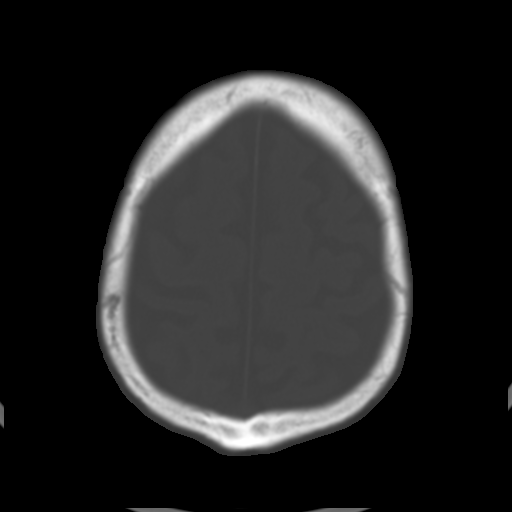
[im 27/32  brain]
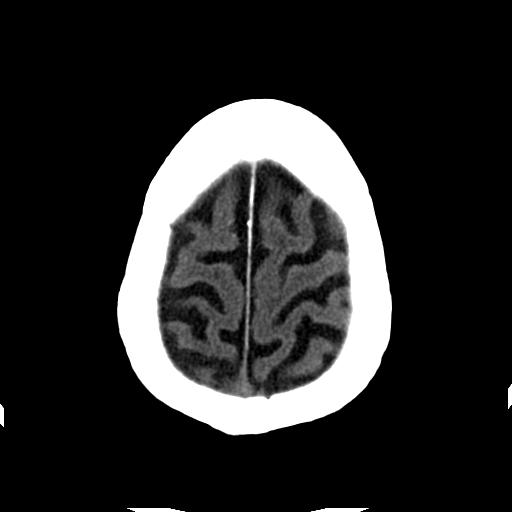
[im 29/32  brain]
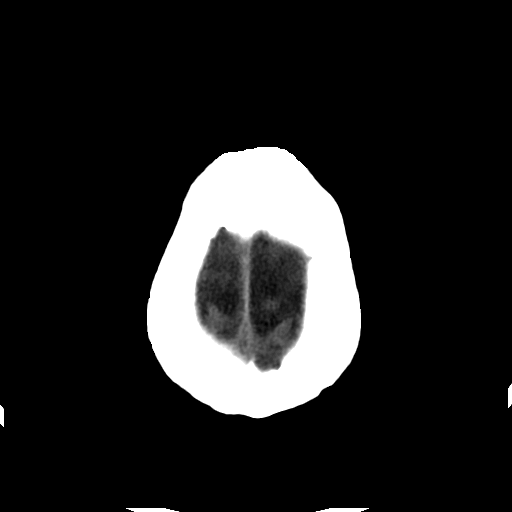

[Series 204: sagittal st, idose (1) · sagittal · 0.40mm/px · 3 of 49 slices shown]
[im 17/49  brain]
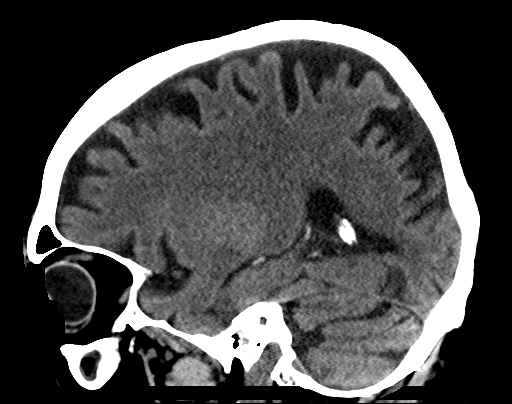
[im 25/49  brain]
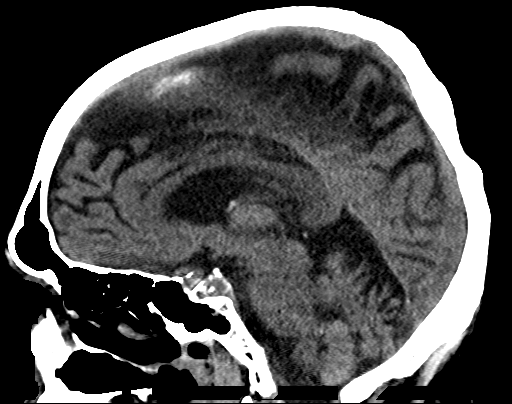
[im 33/49  brain]
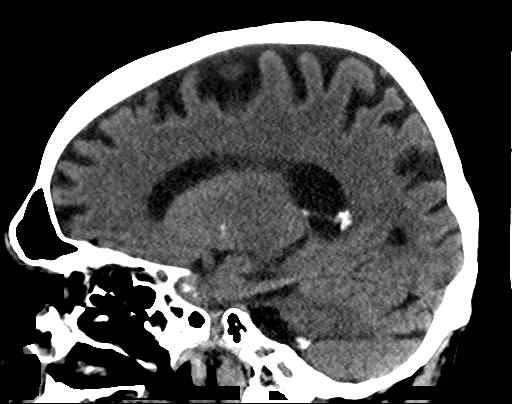

[Series 205: coronal st, idose (1) · coronal · 0.43mm/px · 3 of 61 slices shown]
[im 21/61  brain]
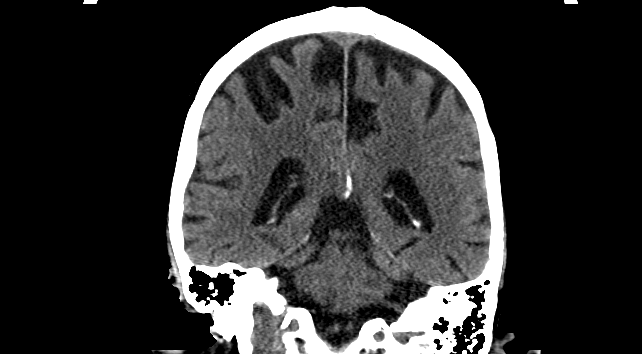
[im 27/61  brain]
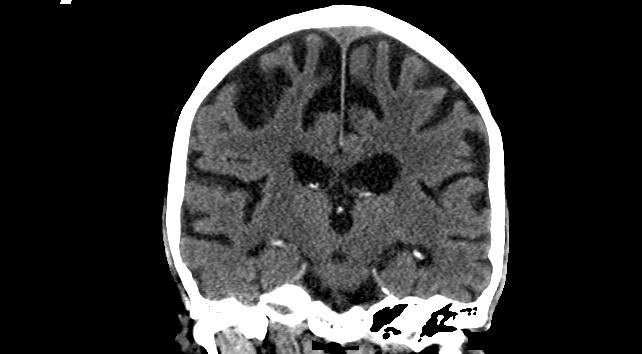
[im 34/61  brain]
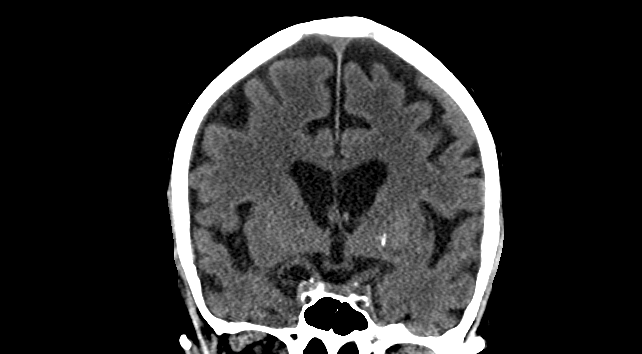

[17 of 47 positions shown; findings below may reference images not displayed]

FINDINGS: There is no evidence of acute infarction, mass lesion, or intra- or
extra-axial hemorrhage on CT.

Prominence of the ventricles and sulci reflects moderate cortical
volume loss. Cerebellar atrophy is noted. Scattered periventricular
white matter change likely reflects small vessel ischemic
microangiopathy.

The brainstem and fourth ventricle are within normal limits. The
basal ganglia are unremarkable in appearance. The cerebral
hemispheres demonstrate grossly normal gray-white differentiation.
No mass effect or midline shift is seen.

There is no evidence of fracture; visualized osseous structures are
unremarkable in appearance. The orbits are within normal limits.
There is partial opacification of the right maxillary sinus. The
remaining paranasal sinuses and mastoid air cells are well-aerated.
No significant soft tissue abnormalities are seen.
IMPRESSION: 1. No acute intracranial pathology seen on CT.
2. Moderate cortical volume loss and scattered small vessel ischemic
microangiopathy.
3. Partial opacification of the right maxillary sinus.
